# Patient Record
Sex: Female | Born: 1945 | Race: Black or African American | Hispanic: No | Marital: Married | Smoking: Never smoker
Health system: Southern US, Community
[De-identification: ages and names within clinical notes are randomized; demographics above are authoritative.]

## PROBLEM LIST (undated history)

## (undated) DIAGNOSIS — R519 Headache, unspecified: Secondary | ICD-10-CM

## (undated) DIAGNOSIS — D539 Nutritional anemia, unspecified: Secondary | ICD-10-CM

## (undated) DIAGNOSIS — D509 Iron deficiency anemia, unspecified: Secondary | ICD-10-CM

## (undated) DIAGNOSIS — E785 Hyperlipidemia, unspecified: Secondary | ICD-10-CM

## (undated) DIAGNOSIS — C9110 Chronic lymphocytic leukemia of B-cell type not having achieved remission: Secondary | ICD-10-CM

## (undated) DIAGNOSIS — N189 Chronic kidney disease, unspecified: Secondary | ICD-10-CM

## (undated) DIAGNOSIS — R011 Cardiac murmur, unspecified: Secondary | ICD-10-CM

## (undated) DIAGNOSIS — Z87442 Personal history of urinary calculi: Secondary | ICD-10-CM

## (undated) DIAGNOSIS — E781 Pure hyperglyceridemia: Secondary | ICD-10-CM

## (undated) DIAGNOSIS — R51 Headache: Secondary | ICD-10-CM

## (undated) DIAGNOSIS — D72828 Other elevated white blood cell count: Secondary | ICD-10-CM

## (undated) DIAGNOSIS — M1990 Unspecified osteoarthritis, unspecified site: Secondary | ICD-10-CM

## (undated) DIAGNOSIS — I10 Essential (primary) hypertension: Secondary | ICD-10-CM

## (undated) HISTORY — PX: ABDOMINAL HYSTERECTOMY: SHX81

## (undated) HISTORY — DX: Hyperlipidemia, unspecified: E78.5

## (undated) HISTORY — DX: Cardiac murmur, unspecified: R01.1

## (undated) HISTORY — PX: OOPHORECTOMY: SHX86

## (undated) HISTORY — PX: JOINT REPLACEMENT: SHX530

## (undated) HISTORY — DX: Nutritional anemia, unspecified: D53.9

## (undated) HISTORY — DX: Essential (primary) hypertension: I10

## (undated) HISTORY — DX: Chronic lymphocytic leukemia of B-cell type not having achieved remission: C91.10

## (undated) HISTORY — DX: Pure hyperglyceridemia: E78.1

## (undated) HISTORY — DX: Other elevated white blood cell count: D72.828

## (undated) HISTORY — DX: Unspecified osteoarthritis, unspecified site: M19.90

---

## 1994-11-20 HISTORY — PX: REFRACTIVE SURGERY: SHX103

## 1994-11-20 HISTORY — PX: EYE SURGERY: SHX253

## 1999-12-17 ENCOUNTER — Emergency Department (HOSPITAL_COMMUNITY): Admission: EM | Admit: 1999-12-17 | Discharge: 1999-12-17 | Payer: Self-pay | Admitting: Emergency Medicine

## 2000-01-31 ENCOUNTER — Encounter: Payer: Self-pay | Admitting: Internal Medicine

## 2000-01-31 ENCOUNTER — Encounter: Admission: RE | Admit: 2000-01-31 | Discharge: 2000-01-31 | Payer: Self-pay | Admitting: Internal Medicine

## 2000-03-28 ENCOUNTER — Encounter: Admission: RE | Admit: 2000-03-28 | Discharge: 2000-04-12 | Payer: Self-pay | Admitting: Internal Medicine

## 2000-05-12 ENCOUNTER — Encounter: Payer: Self-pay | Admitting: Emergency Medicine

## 2000-05-12 ENCOUNTER — Emergency Department (HOSPITAL_COMMUNITY): Admission: EM | Admit: 2000-05-12 | Discharge: 2000-05-12 | Payer: Self-pay | Admitting: Emergency Medicine

## 2000-05-25 ENCOUNTER — Encounter: Admission: RE | Admit: 2000-05-25 | Discharge: 2000-05-25 | Payer: Self-pay | Admitting: Urology

## 2000-05-25 ENCOUNTER — Encounter: Payer: Self-pay | Admitting: Urology

## 2001-03-28 ENCOUNTER — Encounter: Payer: Self-pay | Admitting: Internal Medicine

## 2001-03-28 ENCOUNTER — Encounter: Admission: RE | Admit: 2001-03-28 | Discharge: 2001-03-28 | Payer: Self-pay | Admitting: Internal Medicine

## 2001-04-04 ENCOUNTER — Encounter: Admission: RE | Admit: 2001-04-04 | Discharge: 2001-04-04 | Payer: Self-pay | Admitting: Internal Medicine

## 2001-04-04 ENCOUNTER — Encounter: Payer: Self-pay | Admitting: Internal Medicine

## 2001-10-08 ENCOUNTER — Encounter: Payer: Self-pay | Admitting: Internal Medicine

## 2001-10-08 ENCOUNTER — Ambulatory Visit (HOSPITAL_COMMUNITY): Admission: RE | Admit: 2001-10-08 | Discharge: 2001-10-08 | Payer: Self-pay | Admitting: Internal Medicine

## 2002-01-26 ENCOUNTER — Encounter: Payer: Self-pay | Admitting: Emergency Medicine

## 2002-01-26 ENCOUNTER — Emergency Department (HOSPITAL_COMMUNITY): Admission: EM | Admit: 2002-01-26 | Discharge: 2002-01-26 | Payer: Self-pay | Admitting: Emergency Medicine

## 2002-04-07 ENCOUNTER — Encounter: Payer: Self-pay | Admitting: Internal Medicine

## 2002-04-07 ENCOUNTER — Encounter: Admission: RE | Admit: 2002-04-07 | Discharge: 2002-04-07 | Payer: Self-pay | Admitting: Internal Medicine

## 2003-05-04 ENCOUNTER — Other Ambulatory Visit: Admission: RE | Admit: 2003-05-04 | Discharge: 2003-05-04 | Payer: Self-pay | Admitting: Internal Medicine

## 2004-03-19 ENCOUNTER — Emergency Department (HOSPITAL_COMMUNITY): Admission: EM | Admit: 2004-03-19 | Discharge: 2004-03-19 | Payer: Self-pay | Admitting: Emergency Medicine

## 2004-04-13 ENCOUNTER — Ambulatory Visit (HOSPITAL_COMMUNITY): Admission: RE | Admit: 2004-04-13 | Discharge: 2004-04-13 | Payer: Self-pay | Admitting: Orthopedic Surgery

## 2004-04-13 ENCOUNTER — Ambulatory Visit (HOSPITAL_BASED_OUTPATIENT_CLINIC_OR_DEPARTMENT_OTHER): Admission: RE | Admit: 2004-04-13 | Discharge: 2004-04-13 | Payer: Self-pay | Admitting: Orthopedic Surgery

## 2005-06-08 ENCOUNTER — Other Ambulatory Visit: Admission: RE | Admit: 2005-06-08 | Discharge: 2005-06-08 | Payer: Self-pay | Admitting: Internal Medicine

## 2005-11-20 HISTORY — PX: REPLACEMENT TOTAL KNEE: SUR1224

## 2006-08-06 ENCOUNTER — Inpatient Hospital Stay (HOSPITAL_COMMUNITY): Admission: RE | Admit: 2006-08-06 | Discharge: 2006-08-10 | Payer: Self-pay | Admitting: Orthopedic Surgery

## 2006-08-25 ENCOUNTER — Emergency Department (HOSPITAL_COMMUNITY): Admission: EM | Admit: 2006-08-25 | Discharge: 2006-08-25 | Payer: Self-pay | Admitting: Emergency Medicine

## 2006-09-11 ENCOUNTER — Encounter: Payer: Self-pay | Admitting: Orthopedic Surgery

## 2006-09-20 ENCOUNTER — Encounter: Payer: Self-pay | Admitting: Orthopedic Surgery

## 2006-10-20 ENCOUNTER — Encounter: Payer: Self-pay | Admitting: Orthopedic Surgery

## 2006-11-20 HISTORY — PX: COLONOSCOPY: SHX174

## 2007-05-30 ENCOUNTER — Ambulatory Visit: Payer: Self-pay

## 2008-06-02 ENCOUNTER — Ambulatory Visit: Payer: Self-pay

## 2008-06-09 ENCOUNTER — Ambulatory Visit: Payer: Self-pay

## 2008-06-11 ENCOUNTER — Other Ambulatory Visit: Admission: RE | Admit: 2008-06-11 | Discharge: 2008-06-11 | Payer: Self-pay | Admitting: Internal Medicine

## 2008-11-18 ENCOUNTER — Encounter: Payer: Self-pay | Admitting: Orthopedic Surgery

## 2008-11-20 ENCOUNTER — Encounter: Payer: Self-pay | Admitting: Orthopedic Surgery

## 2008-12-21 ENCOUNTER — Encounter: Payer: Self-pay | Admitting: Orthopedic Surgery

## 2009-05-07 ENCOUNTER — Ambulatory Visit: Payer: Self-pay | Admitting: General Practice

## 2009-06-02 ENCOUNTER — Emergency Department (HOSPITAL_COMMUNITY): Admission: EM | Admit: 2009-06-02 | Discharge: 2009-06-02 | Payer: Self-pay | Admitting: Emergency Medicine

## 2009-07-30 ENCOUNTER — Ambulatory Visit: Payer: Self-pay | Admitting: Oncology

## 2009-08-03 ENCOUNTER — Encounter: Payer: Self-pay | Admitting: Oncology

## 2009-08-03 ENCOUNTER — Other Ambulatory Visit: Admission: RE | Admit: 2009-08-03 | Discharge: 2009-08-03 | Payer: Self-pay | Admitting: Oncology

## 2009-08-03 LAB — CHCC SMEAR

## 2009-08-03 LAB — CBC WITH DIFFERENTIAL/PLATELET
EOS%: 0.6 % (ref 0.0–7.0)
HGB: 10.2 g/dL — ABNORMAL LOW (ref 11.6–15.9)
LYMPH%: 67.6 % — ABNORMAL HIGH (ref 14.0–49.7)
MCH: 31.1 pg (ref 25.1–34.0)
NEUT#: 3.7 10*3/uL (ref 1.5–6.5)
NEUT%: 27.2 % — ABNORMAL LOW (ref 38.4–76.8)
Platelets: 297 10*3/uL (ref 145–400)
WBC: 13.6 10*3/uL — ABNORMAL HIGH (ref 3.9–10.3)
lymph#: 9.2 10*3/uL — ABNORMAL HIGH (ref 0.9–3.3)

## 2009-08-05 LAB — SPEP & IFE WITH QIG
Albumin ELP: 57.6 % (ref 55.8–66.1)
Alpha-1-Globulin: 4.7 % (ref 2.9–4.9)
Beta Globulin: 7.8 % — ABNORMAL HIGH (ref 4.7–7.2)
IgA: 150 mg/dL (ref 68–378)

## 2009-08-05 LAB — VITAMIN B12: Vitamin B-12: 705 pg/mL (ref 211–911)

## 2009-08-05 LAB — IRON AND TIBC
Iron: 82 ug/dL (ref 42–145)
TIBC: 422 ug/dL (ref 250–470)

## 2009-08-05 LAB — COMPREHENSIVE METABOLIC PANEL
ALT: 16 U/L (ref 0–35)
Albumin: 4.3 g/dL (ref 3.5–5.2)
CO2: 20 mEq/L (ref 19–32)
Glucose, Bld: 137 mg/dL — ABNORMAL HIGH (ref 70–99)

## 2009-08-05 LAB — ERYTHROPOIETIN: Erythropoietin: 17.7 m[IU]/mL (ref 2.6–34.0)

## 2009-08-05 LAB — FOLATE: Folate: >20 ng/mL

## 2009-08-11 LAB — FLOW CYTOMETRY

## 2009-09-16 ENCOUNTER — Encounter: Admission: RE | Admit: 2009-09-16 | Discharge: 2009-09-16 | Payer: Self-pay | Admitting: Orthopedic Surgery

## 2009-11-29 ENCOUNTER — Ambulatory Visit: Payer: Self-pay | Admitting: Oncology

## 2009-12-29 ENCOUNTER — Ambulatory Visit (HOSPITAL_BASED_OUTPATIENT_CLINIC_OR_DEPARTMENT_OTHER): Admission: RE | Admit: 2009-12-29 | Discharge: 2009-12-29 | Payer: Self-pay | Admitting: Orthopedic Surgery

## 2010-01-10 ENCOUNTER — Ambulatory Visit: Payer: Self-pay | Admitting: Oncology

## 2010-01-12 LAB — CBC WITH DIFFERENTIAL/PLATELET
Basophils Absolute: 0 10*3/uL (ref 0.0–0.1)
EOS%: 0.1 % (ref 0.0–7.0)
Eosinophils Absolute: 0 10*3/uL (ref 0.0–0.5)
HGB: 10.3 g/dL — ABNORMAL LOW (ref 11.6–15.9)
MCH: 29.9 pg (ref 25.1–34.0)
MONO%: 2.1 % (ref 0.0–14.0)
NEUT%: 31 % — ABNORMAL LOW (ref 38.4–76.8)
Platelets: 302 10*3/uL (ref 145–400)
RBC: 3.44 10*6/uL — ABNORMAL LOW (ref 3.70–5.45)
WBC: 19.5 10*3/uL — ABNORMAL HIGH (ref 3.9–10.3)
lymph#: 13 10*3/uL — ABNORMAL HIGH (ref 0.9–3.3)

## 2010-01-12 LAB — IRON AND TIBC: %SAT: 14 % — ABNORMAL LOW (ref 20–55)

## 2010-01-12 LAB — COMPREHENSIVE METABOLIC PANEL
AST: 18 U/L (ref 0–37)
Albumin: 3.9 g/dL (ref 3.5–5.2)
Alkaline Phosphatase: 74 U/L (ref 39–117)
BUN: 27 mg/dL — ABNORMAL HIGH (ref 6–23)
Creatinine, Ser: 1.74 mg/dL — ABNORMAL HIGH (ref 0.40–1.20)
Sodium: 134 mEq/L — ABNORMAL LOW (ref 135–145)
Total Bilirubin: 0.6 mg/dL (ref 0.3–1.2)

## 2010-01-12 LAB — FERRITIN: Ferritin: 13 ng/mL (ref 10–291)

## 2010-01-26 ENCOUNTER — Other Ambulatory Visit: Admission: RE | Admit: 2010-01-26 | Discharge: 2010-01-26 | Payer: Self-pay | Admitting: Family Medicine

## 2010-07-12 ENCOUNTER — Ambulatory Visit: Payer: Self-pay

## 2010-12-11 ENCOUNTER — Encounter: Payer: Self-pay | Admitting: Orthopedic Surgery

## 2011-02-10 LAB — BASIC METABOLIC PANEL
CO2: 26 mEq/L (ref 19–32)
Chloride: 102 mEq/L (ref 96–112)
Creatinine, Ser: 1.43 mg/dL — ABNORMAL HIGH (ref 0.4–1.2)
GFR calc non Af Amer: 37 mL/min — ABNORMAL LOW (ref >60)
Glucose, Bld: 151 mg/dL — ABNORMAL HIGH (ref 70–99)

## 2011-02-10 LAB — GLUCOSE, CAPILLARY
Glucose-Capillary: 78 mg/dL (ref 70–99)
Glucose-Capillary: 92 mg/dL (ref 70–99)

## 2011-04-07 NOTE — Op Note (Signed)
NAME:  Tammy [name], Tammy [name] NO.:  [id number]   MEDICAL RECORD NO.:  [id number]                   PATIENT TYPE:  AMB   LOCATION:  DSC                                  FACILITY:  [facility]   PHYSICIAN:  Harvie Junior, M.D.                DATE OF BIRTH:  02-Feb-1946   DATE OF PROCEDURE:  04/13/2004  DATE OF DISCHARGE:                                 OPERATIVE REPORT   PREOPERATIVE DIAGNOSIS:  Medial meniscal tear.   POSTOPERATIVE DIAGNOSIS:  1. Medial meniscal tear.  2. Chondromalacia of the medial femoral condyle.  3. Medial shelf plica.  4. Chondromalacia of the patella.   PROCEDURE:  1. Debridement of posterior horn of medial meniscal tear with corresponding     debridement of medial femoral condyle.  2. Debridement of medial shelf plica.  3. Debridement of chondromalacia of the patella.   SURGEON:  Harvie Junior, M.D.   ASSISTANT:  [name], P.A.   ANESTHESIA:  General.   BRIEF HISTORY:  She is a 65 year old female with a long history of having  medial joint line pain.  She was ultimately evaluated by Dr. Donette Larry who  evaluated her and felt she had a meniscal tear.  He got an MRI which showed  she had a meniscal tear and she was sent over for evaluation.  We evaluated  her at that time and felt that she did have a displaced meniscus tear with  positive [place] Scrape and felt that operative intervention was the appropriate  course of action and she is brought to the operating room for this  procedure.   DESCRIPTION OF PROCEDURE:  The patient was taken to the operating room and  after adequate general anesthesia, the patient was placed on the operating  table, the left leg was prepped and draped in the usual sterile fashion.  Following this, routine arthroscopic examination of the knee revealed there  was an obvious posterior horn medial meniscal tear.  This was debrided with  a combination of straight biting forceps and upbiting forceps.  The  remaining meniscal rim was contoured down with a suction shaver.  The medial  medial femoral condyle was noted to have some grade 2 changes and this was  debrided.  The medial gutter was then evaluated and noted to have a fairly  significant synovial proliferative changes.  This was debrided with a  suction shaver.  She also had a large medial shelf plica which was debrided.  Attention was turned to the patellofemoral joint where there was some grade  2 changes of chondromalacia and this was debrided. The patellofemoral track  was noted to be midline.  The lateral  compartment was identified and noted to e within normal limits.  The ACL was  normal.  At this point, the knee was copiously irrigated and suctioned dry.  The arthroscopic portals were closed with a bandage.  A  sterile compressive  dressing was applied.  The patient was taken to the recovery room and was  noted to be in satisfactory condition.                                               Harvie Junior, M.D.    Ranae Plumber  D:  04/13/2004  T:  04/13/2004  Job:  [id number]

## 2011-04-07 NOTE — Op Note (Signed)
NAME:  Tammy [name], Tammy [name] NO.:  [id number]   MEDICAL RECORD NO.:  [id number]          PATIENT TYPE:  INP   LOCATION:  5016                         FACILITY:  [facility]   PHYSICIAN:  Harvie Junior, M.D.   DATE OF BIRTH:  25-Mar-1946   DATE OF PROCEDURE:  DATE OF DISCHARGE:                                 OPERATIVE REPORT   PREOPERATIVE DIAGNOSIS:  End stage degenerative joint disease, left knee.   POSTOPERATIVE DIAGNOSIS:  End stage degenerative joint disease, left knee.   PROCEDURE:  1. Left total knee replacement.  2. Computer assisted left total knee replacement.   SURGEON:  Jodi Geralds, MD   ASSISTANT:  Gus Puma, present throughout the case.   BRIEF HISTORY:  Tammy [name] is a 65 year old female with a long history of  having had significant left knee pain.  She basically had been treated with  antiinflammatory medication, injection therapy, pain medication, activity  modification.  All of these things failed and she continued to have left  knee pain.  We talked about treatment options including the possibility of  repeat arthroscopic intervention versus knee replacement.  Given that she  had bone-on-bone changes on her x-ray and had failed what we consider to be  adequate conservative care, we also felt that at this time it was  appropriate to take her to the operating room for a left total knee  replacement.  Given her young age and need for prolonged life of her joint  replacement, it was felt that computer assisted total knee replacement was  appropriate to allow for prolonged life of the implant.  She was brought to  the operating room for these procedures.   PROCEDURE:  The patient was brought to the operating room and after adequate  anesthesia was obtained with a general anesthetic the patient was positioned  on the operating room table.  The left leg was prepped and draped in the  usual sterile fashion.  Following this, a midline incision was made,  subcutaneous tissue down to the level of the extensor mechanism and a medial  parapatellar arthrotomy was undertaken.  Following this, attention was  turned towards the medial exposure where a medial exposure was undertaken.  The anterior and posterior cruciates were then excised.  The medial and  lateral meniscus were incised in total.  Once this was accomplished,  attention was turned towards the computer assistance module.  The two distal  pins were placed percutaneously and two pins were placed proximally within  the femur to allow access to the rays.  The rays were then placed at this  point and then registration process undertaken; this takes about 15 minutes  to do this.  Once the registration was completed then the tibia was cut  perpendicular to its long axis.  Attention was then turned towards the  placement of the tensiometers.  The tensiometers were used to get maximum  flexion and extension gaps and this was felt to be appropriate given the  balance.  At this point the distal femur was cut and at that point I like to  use the spacer blocks just to check; a little bit snug in extension and at  that point I felt to cut another 2 mm of tibia and this was undertaken.  Once that was done the spacer blocks went in quite well and this also took  the extensor situation out of the red on the intraoperative computer  assisted plan, because that then gave enough room for the implant and  extension.  At this point, attention was turned towards setting the anterior  and posterior cuts.  These were done with the size 2.5 block.  The box cut  was then made.  Following this, attention was turned to the tibia, which is  sized at 3 and the tibia rotation was set.  The rotation was set off of the  femur with the femoral rotation with the computer module.  At this point the  trial tibia was put in place.  It was drilled.  The trial femur was put in  place.  It was drilled and logs were drilled and  then the patella was sized  and cut to a 38 mm, all poly patella.  We cut off a 10 and put this back in.  At this point, once this was completed, the trial components were put in  place and put through a range of motion.  Computer assistance then at that  point showed [place] that we had good full extension and that we had good gap  balance, about 1 degree of varus and I felt that was fine, given the gaps  being balanced and a significant rotational alignment that she had coming  into the case, I was very happy with that final situation.  At that point  the wounds were copiously irrigated.  The trial components were removed and  the bone was washed thoroughly and dried.  Once that was the case the final  components were cemented into place; 2.5 femur, size 3 tibia, a 10 mm  bridging bearing, and a 38 mm all polyethylene patella.  All excess cement  was removed from the case during the wash out.  At this point, the knee was  put through a range of motion, our final computer assistance alignment was  undertaken and noted to have a perfect long alignment with 1 degree of varus  but certainly acceptable long alignment, which I thought was great given the  rotational issues that we were dealing with.  The perfect gap balance is  that of full extension and 90 degrees of flexion.  At this point a medium  Hemovac drain was placed.  The medial parapatellar arthrotomy was closed  with a #1 Vicryl running suture.  The skin was then closed with 0 and 2-0  Vicryl's and skin staples.  A sterile compressive dressing was applied, as  well as a knee immobilizer.  The patient was taken to recovery and she was  noted to be in satisfactory condition.  Estimated blood loss during  procedure was less than 50 cc.  The tourniquet was let down prior to  closure.      Harvie Junior, M.D.  Electronically Signed     JLG/MEDQ  D:  08/06/2006  T:  08/07/2006  Job:  [id number]

## 2011-04-07 NOTE — Discharge Summary (Signed)
[name], [name] NO.:  [id number]   MEDICAL RECORD NO.:  [id number]          PATIENT TYPE:  INP   LOCATION:  5016                         FACILITY:  [facility]   PHYSICIAN:  Harvie Junior, M.D.   DATE OF BIRTH:  November 16, 1946   DATE OF ADMISSION:  08/06/2006  DATE OF DISCHARGE:  08/10/2006                                 DISCHARGE SUMMARY   ADMITTING DIAGNOSES:  1. End-stage degenerative joint disease left knee.  2. Hypertension.  3. Type 2 diabetes mellitus.   DISCHARGE DIAGNOSES:  1. End-stage degenerative joint disease left knee.  2. Hypertension.  3. Type 2 diabetes mellitus.  4. Blood loss anemia.   PROCEDURES IN HOSPITAL:  Left total knee replacement, computer assisted,  Jodi Geralds, MD, August 06, 2006.   BRIEF HISTORY:  Tammy [name] is a pleasant 65 year old female who has a long  history of left knee pain.  X-rays showed bone on bone arthritis.  She has  night pain and pain with ambulation.  She got no relief with conservative  treatment including multiple courses of injections, modification of her  activity and use of antiinflammatories.  The patient went under clinical and  radiographic findings, she is admitted for a left total knee replacement.   PERTINENT LABORATORY STUDIES:  Patient's EKG, on admission, showed normal  sinus rhythm with left bundle branch block.  Hemoglobin was 11.0, hematocrit  32.6.  On postop day number 1, her hemoglobin was 8.8, number 2 was 8.8,  number 3 8.5.  Her Protime on admission was 13.0, INR of 1.0, PTT was 24.0.  On the day of discharge, on Coumadin therapy, her INR was 1.7.  CMET, on  admission, was within normal limits.  BMET, on postop day number 1, was also  normal.  Urinalysis, on admission, was positive for nitrates and many  bacteria.   HOSPITAL COURSE:  Patient was taken to the operating room on August 06, 2006 where she underwent left total knee arthroplasty, computer assisted by  Dr. Luiz [name], as well  as described in his operative note.  Preoperatively, she  is given 80 mg IV of gentamycin and 1 gram of Ancef.  She is also given 5  doses of Ancef postoperatively q.8 hours.  She was put on IV fluids, her  usual home medications and a sliding scale insulin.  Coumadin was started  for DVT prophylaxis on the night of surgery.  On postop day number 1, she is  without complaints.  She is taking fluids without difficulty.  A Foley  catheter was put in place at the time of surgery was intact.  She had spiked  a fever up to 100.1 and then was found to be afebrile.  Her left knee  dressing was clean and dry.  Her hemoglobin was 8.8.  Her INR was 1.0.  Her  blood sugars remained relatively stable with the sliding scale insulin  treatments.  On postop day number 2, she was without complaints.  She was  taking fluids without difficulty.  She had gotten out of bed to the chair.  Foley catheter  was still intact.  She spiked a fever up to 101.8, was then  found to be 99.4.  Her left knee dressing was changed and her Hemovac drain  was pulled and her CBGs were up in the 232 range.  Her hemoglobin was 8.8  and her INR was 2.0.  She was felt to have some blood loss anemia.  PCA and  morphine pumps were discontinued.  Her IV was converted to a saline lock.  She works at a [employer] facility and she was interested in being  transferred to that facility in the [place] area.  She was unable to get  insurance coverage for this.  On postop day number 3, her hemoglobin was 8.5  and her INR was 2.1.  On postop day number 4, she was without complaints.  She was doing well with physical therapy.  She was taking fluids and voiding  without difficulty.  She stated she was ready to go home.  Her vital signs  were stable and she was afebrile.  Her CBGs were running around 194.  Her  INR was 1.7.  She is discharged home in improved condition.  She will be on  a diabetic diet.  She will take her usual home  medications.  She was given  an prescription of Percocet 5 mg number 50 p.r.n. for pain, Coumadin x1  month postoperative for DVT prophylaxis.  She will ambulate weight bearing  as tolerated on the left with a walker.  __________physical therapy 3 times  a week, home CPM and Coumadin management x1 month.  She will follow up with  Dr. Luiz [name] in 10 days.  She will follow up with her medical doctor about her  diabetes in 2 weeks.      Tammy [name], P.A.      Harvie Junior, M.D.  Electronically Signed    Tammy [name]  D:  09/26/2006  T:  09/27/2006  Job:  [id number]   cc:   Tammy Housekeeper, MD

## 2011-08-08 ENCOUNTER — Ambulatory Visit: Payer: Self-pay | Admitting: Internal Medicine

## 2011-08-10 ENCOUNTER — Ambulatory Visit: Payer: Self-pay | Admitting: Internal Medicine

## 2011-11-28 ENCOUNTER — Telehealth: Payer: Self-pay | Admitting: *Deleted

## 2011-11-28 NOTE — Telephone Encounter (Signed)
patient called in and requested to 01-12-2012 rescheduled from 08-25-2011 gave patinet confirmed over the phone the new date and time

## 2011-12-18 ENCOUNTER — Encounter: Payer: Self-pay | Admitting: *Deleted

## 2012-01-11 ENCOUNTER — Telehealth: Payer: Self-pay | Admitting: Oncology

## 2012-01-11 NOTE — Telephone Encounter (Signed)
Pt called to r/s 2/22 appt tp 3/22 due to just started new job.

## 2012-01-12 ENCOUNTER — Other Ambulatory Visit: Payer: Self-pay | Admitting: Lab

## 2012-01-12 ENCOUNTER — Ambulatory Visit: Payer: Self-pay | Admitting: Oncology

## 2012-02-09 ENCOUNTER — Other Ambulatory Visit (HOSPITAL_BASED_OUTPATIENT_CLINIC_OR_DEPARTMENT_OTHER): Payer: Medicare Other | Admitting: Lab

## 2012-02-09 ENCOUNTER — Telehealth: Payer: Self-pay | Admitting: Oncology

## 2012-02-09 ENCOUNTER — Ambulatory Visit (HOSPITAL_BASED_OUTPATIENT_CLINIC_OR_DEPARTMENT_OTHER): Payer: Medicare Other | Admitting: Oncology

## 2012-02-09 VITALS — BP 123/62 | HR 73 | Temp 96.8°F | Ht 68.0 in | Wt 250.8 lb

## 2012-02-09 DIAGNOSIS — D649 Anemia, unspecified: Secondary | ICD-10-CM

## 2012-02-09 DIAGNOSIS — N289 Disorder of kidney and ureter, unspecified: Secondary | ICD-10-CM

## 2012-02-09 DIAGNOSIS — D72820 Lymphocytosis (symptomatic): Secondary | ICD-10-CM

## 2012-02-09 DIAGNOSIS — C9110 Chronic lymphocytic leukemia of B-cell type not having achieved remission: Secondary | ICD-10-CM

## 2012-02-09 LAB — CBC WITH DIFFERENTIAL/PLATELET
Basophils Absolute: 0.2 10*3/uL — ABNORMAL HIGH (ref 0.0–0.1)
EOS%: 0.5 % (ref 0.0–7.0)
HGB: 10.8 g/dL — ABNORMAL LOW (ref 11.6–15.9)
MCH: 31.4 pg (ref 25.1–34.0)
MCV: 95.5 fL (ref 79.5–101.0)
MONO%: 1.7 % (ref 0.0–14.0)
NEUT%: 12 % — ABNORMAL LOW (ref 38.4–76.8)
RDW: 15.2 % — ABNORMAL HIGH (ref 11.2–14.5)

## 2012-02-09 LAB — COMPREHENSIVE METABOLIC PANEL
ALT: 15 U/L (ref 0–35)
Albumin: 4.2 g/dL (ref 3.5–5.2)
CO2: 22 mEq/L (ref 19–32)
Calcium: 9.3 mg/dL (ref 8.4–10.5)
Chloride: 106 mEq/L (ref 96–112)
Glucose, Bld: 118 mg/dL — ABNORMAL HIGH (ref 70–99)
Sodium: 139 mEq/L (ref 135–145)
Total Protein: 6.4 g/dL (ref 6.0–8.3)

## 2012-02-09 LAB — LACTATE DEHYDROGENASE: LDH: 173 U/L (ref 94–250)

## 2012-02-09 NOTE — Progress Notes (Signed)
Hematology and Oncology Follow Up Visit  Tammy [name] [id number] Dec 22, 1945 66 y.o. 02/09/2012 9:13 AM  CC: Tammy [name], M.D.  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 Diagnosed in 2010.     Current therapy: Observation and Surveillance.   Interim History:  Tammy [name] presents today for a follow-up visit.  A very pleasant 66 year old female with mild anemia and lymphocytosis likely diagnosis of CLL.  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  However, other than anemia that is probably multifactorial, there is no evidence of any infiltrative bone marrow process.  She has not reported any fevers or chills.  She has not reported any lymphadenopathy.  She has not reported any other complaints at the time being. No changes in her performance status at this time.   Medications: I have reviewed the patient's current medications. Current outpatient prescriptions:aspirin 81 MG tablet, Take 81 mg by mouth daily., Disp: , Rfl: ;  benazepril-hydrochlorthiazide (LOTENSIN HCT) 20-12.5 MG per tablet, Take 1 tablet by mouth daily., Disp: , Rfl: ;  cholecalciferol (VITAMIN D) 400 UNITS TABS, Take 400 Units by mouth daily., Disp: , Rfl: ;  ferrous sulfate 325 (65 FE) MG tablet, Take 325 mg by mouth daily with breakfast., Disp: , Rfl:  glimepiride (AMARYL) 4 MG tablet, Take 4 mg by mouth daily before breakfast., Disp: , Rfl: ;  ibuprofen (ADVIL,MOTRIN) 200 MG tablet, Take 400 mg by mouth every 6 (six) hours as needed., Disp: , Rfl: ;  metFORMIN (GLUCOPHAGE) 1000 MG tablet, Take 1,000 mg by mouth 2 (two) times daily with a meal., Disp: , Rfl: ;  Multiple Vitamins-Minerals (MULTIVITAMIN PO), Take by mouth., Disp: , Rfl:  omega-3 acid ethyl esters (LOVAZA) 1 G capsule, Take 1 g by mouth 2 (two) times daily., Disp: , Rfl: ;  pioglitazone (ACTOS) 45 MG tablet, Take 45 mg by mouth daily., Disp: , Rfl: ;  spironolactone  (ALDACTONE) 25 MG tablet, Take 25 mg by mouth daily., Disp: , Rfl: ;  verapamil (CALAN-SR) 180 MG CR tablet, Take 180 mg by mouth at bedtime., Disp: , Rfl:  HYDROcodone-acetaminophen (NORCO) 5-325 MG per tablet, Take 1 tablet by mouth every 6 (six) hours as needed., Disp: , Rfl:   Allergies:  Allergies  Allergen Reactions  . Sulfa Antibiotics     Past Medical History, Surgical history, Social history, and Family History were reviewed and updated.  Review of Systems: Constitutional:  Negative for fever, chills, night sweats, anorexia, weight loss, pain. Cardiovascular: no chest pain or dyspnea on exertion Respiratory: no cough, shortness of breath, or wheezing Neurological: negative Dermatological: negative ENT: negative Skin: Negative. Gastrointestinal: negative Genito-Urinary: negative Hematological and Lymphatic: negative Breast: negative Musculoskeletal: negative Remaining ROS negative. Physical Exam: Blood pressure 123/62, pulse 73, temperature 96.8 F (36 C), temperature source Oral, height 5\' 8"  (1.727 m), weight 250 lb 12.8 oz (113.762 kg). ECOG: 1 General appearance: alert Head: Normocephalic, without obvious abnormality, atraumatic Neck: no adenopathy, no carotid bruit, no JVD, supple, symmetrical, trachea midline and thyroid not enlarged, symmetric, no tenderness/mass/nodules Lymph nodes: Cervical, supraclavicular, and axillary nodes normal. Heart:regular rate and rhythm, S1, S2 normal, no murmur, click, rub or gallop Lung:chest clear, no wheezing, rales, normal symmetric air entry Abdomin: soft, non-tender, without masses or organomegaly EXT:no erythema, induration, or nodules   Lab Results: Lab Results  Component Value Date   WBC 32.5* 02/09/2012   HGB 10.8* 02/09/2012   HCT 32.7* 02/09/2012  MCV 95.5 02/09/2012   PLT 214 02/09/2012     Chemistry      Component Value Date/Time   NA 134* 01/12/2010 1529   K 4.5 01/12/2010 1529   CL 100 01/12/2010 1529   CO2  27 01/12/2010 1529   BUN 27* 01/12/2010 1529   CREATININE 1.74* 01/12/2010 1529      Component Value Date/Time   CALCIUM 9.0 01/12/2010 1529   ALKPHOS 74 01/12/2010 1529   AST 18 01/12/2010 1529   ALT 16 01/12/2010 1529   BILITOT 0.6 01/12/2010 1529      Impression and Plan: A 66 year old female with the following issues: 1. Lymphocytosis likely due to CLL.  Her lymphocyte percentage did do up but her Hemoglobin has remained stable at this time.  The plan is to continue to monitor her white cell count.  Again, if she develops symptomatic lymphadenopathy, abdominal distention or early satiety then we will do a CT scan and consider a bone marrow biopsy.   2. Anemia.  Again multifactorial, again anemia of renal disease.  Again, she could be a candidate for growth factor support if needed.   [facility], MD 3/22/20139:13 AM

## 2012-02-09 NOTE — Telephone Encounter (Signed)
gv pt appt for march2014 °

## 2012-02-12 NOTE — Progress Notes (Signed)
Called patient concerning potassium level @ 5.6; encouraged patient to increase fluids to 1.5-2 Liters daily, per Clenton Pare, NP;verbalizes understanding; patient states that she has appointment with PCP 03/04/12.

## 2012-02-13 ENCOUNTER — Ambulatory Visit: Payer: Self-pay | Admitting: Internal Medicine

## 2012-03-05 ENCOUNTER — Telehealth: Payer: Self-pay | Admitting: *Deleted

## 2012-03-05 NOTE — Telephone Encounter (Signed)
Rec'd referral for patient to see Dr Clelia Croft, already well established, will fax last office visit to Dr Donette Larry. Patient on annual visits with Dr Clelia Croft now.

## 2012-03-06 ENCOUNTER — Encounter: Payer: Self-pay | Admitting: *Deleted

## 2012-09-02 ENCOUNTER — Ambulatory Visit: Payer: Self-pay | Admitting: Internal Medicine

## 2012-12-25 ENCOUNTER — Inpatient Hospital Stay: Payer: Self-pay | Admitting: Internal Medicine

## 2012-12-25 LAB — COMPREHENSIVE METABOLIC PANEL
Albumin: 3.6 g/dL (ref 3.4–5.0)
Anion Gap: 9 (ref 7–16)
BUN: 37 mg/dL — ABNORMAL HIGH (ref 7–18)
Chloride: 105 mmol/L (ref 98–107)
Co2: 23 mmol/L (ref 21–32)
Glucose: 48 mg/dL — ABNORMAL LOW (ref 65–99)
Osmolality: 280 (ref 275–301)
Potassium: 4.6 mmol/L (ref 3.5–5.1)
SGPT (ALT): 24 U/L (ref 12–78)
Sodium: 137 mmol/L (ref 136–145)
Total Protein: 7.6 g/dL (ref 6.4–8.2)

## 2012-12-25 LAB — CK TOTAL AND CKMB (NOT AT ARMC): CK-MB: 0.8 ng/mL (ref 0.5–3.6)

## 2012-12-25 LAB — CBC
HCT: 32.3 % — ABNORMAL LOW (ref 35.0–47.0)
HGB: 10.4 g/dL — ABNORMAL LOW (ref 12.0–16.0)
MCH: 30.6 pg (ref 26.0–34.0)
MCHC: 32.1 g/dL (ref 32.0–36.0)
Platelet: 197 10*3/uL (ref 150–440)
RDW: 15.1 % — ABNORMAL HIGH (ref 11.5–14.5)
WBC: 39.3 10*3/uL — ABNORMAL HIGH (ref 3.6–11.0)

## 2012-12-25 LAB — URINALYSIS, COMPLETE
Bilirubin,UR: NEGATIVE
Ketone: NEGATIVE
Leukocyte Esterase: NEGATIVE
Ph: 5 (ref 4.5–8.0)
Protein: NEGATIVE
RBC,UR: 1 /HPF (ref 0–5)
WBC UR: 1 /HPF (ref 0–5)

## 2012-12-25 LAB — DIFFERENTIAL: Segmented Neutrophils: 20 %

## 2012-12-25 LAB — TROPONIN I: Troponin-I: <0.02 ng/mL

## 2012-12-26 LAB — CBC WITH DIFFERENTIAL/PLATELET
Basophil %: 0.1 %
Eosinophil %: 0.3 %
HGB: 9 g/dL — ABNORMAL LOW (ref 12.0–16.0)
Lymphocyte #: 21.5 10*3/uL — ABNORMAL HIGH (ref 1.0–3.6)
MCH: 30.6 pg (ref 26.0–34.0)
MCHC: 32.1 g/dL (ref 32.0–36.0)
Monocyte #: 0.7 x10 3/mm (ref 0.2–0.9)
Monocyte %: 2.5 %
Neutrophil #: 4.6 10*3/uL (ref 1.4–6.5)
Neutrophil %: 17.1 %
Platelet: 181 10*3/uL (ref 150–440)
RBC: 2.94 10*6/uL — ABNORMAL LOW (ref 3.80–5.20)
RDW: 15 % — ABNORMAL HIGH (ref 11.5–14.5)

## 2012-12-26 LAB — BASIC METABOLIC PANEL
BUN: 32 mg/dL — ABNORMAL HIGH (ref 7–18)
Calcium, Total: 8.5 mg/dL (ref 8.5–10.1)
Chloride: 105 mmol/L (ref 98–107)
Co2: 24 mmol/L (ref 21–32)
Creatinine: 1.74 mg/dL — ABNORMAL HIGH (ref 0.60–1.30)
EGFR (African American): 35 — ABNORMAL LOW
EGFR (Non-African Amer.): 30 — ABNORMAL LOW
Osmolality: 283 (ref 275–301)
Potassium: 5.4 mmol/L — ABNORMAL HIGH (ref 3.5–5.1)
Sodium: 135 mmol/L — ABNORMAL LOW (ref 136–145)

## 2012-12-26 LAB — OCCULT BLOOD X 1 CARD TO LAB, STOOL: Occult Blood, Feces: NEGATIVE

## 2012-12-27 LAB — CBC WITH DIFFERENTIAL/PLATELET
Eosinophil: 1 %
HGB: 9.7 g/dL — ABNORMAL LOW (ref 12.0–16.0)
MCH: 30.9 pg (ref 26.0–34.0)
MCHC: 32.7 g/dL (ref 32.0–36.0)
RBC: 3.15 10*6/uL — ABNORMAL LOW (ref 3.80–5.20)
RDW: 14.6 % — ABNORMAL HIGH (ref 11.5–14.5)
Segmented Neutrophils: 12 %
Variant Lymphocyte - H1-Rlymph: 34 %

## 2012-12-27 LAB — BASIC METABOLIC PANEL
Calcium, Total: 8.6 mg/dL (ref 8.5–10.1)
Chloride: 109 mmol/L — ABNORMAL HIGH (ref 98–107)
Co2: 24 mmol/L (ref 21–32)
Creatinine: 1.32 mg/dL — ABNORMAL HIGH (ref 0.60–1.30)
EGFR (Non-African Amer.): 42 — ABNORMAL LOW

## 2012-12-27 LAB — IRON AND TIBC
Iron Saturation: 16 %
Iron: 51 ug/dL (ref 50–170)
Unbound Iron-Bind.Cap.: 274 ug/dL

## 2012-12-31 ENCOUNTER — Telehealth: Payer: Self-pay | Admitting: Oncology

## 2012-12-31 NOTE — Telephone Encounter (Signed)
returned pt call and advised on March appt.Marland KitchenMarland Kitchen

## 2013-01-10 ENCOUNTER — Ambulatory Visit: Payer: Self-pay | Admitting: Internal Medicine

## 2013-01-10 LAB — CBC CANCER CENTER
Basophil %: 0.3 %
Eosinophil %: 0.3 %
HCT: 31.7 % — ABNORMAL LOW (ref 35.0–47.0)
HGB: 10.2 g/dL — ABNORMAL LOW (ref 12.0–16.0)
Lymphocyte #: 25.3 x10 3/mm — ABNORMAL HIGH (ref 1.0–3.6)
Lymphocyte %: 87.3 %
MCHC: 32.1 g/dL (ref 32.0–36.0)
MCV: 96 fL (ref 80–100)
Monocyte #: 0.6 x10 3/mm (ref 0.2–0.9)
Neutrophil %: 10 %
Platelet: 311 x10 3/mm (ref 150–440)
RBC: 3.31 10*6/uL — ABNORMAL LOW (ref 3.80–5.20)
RDW: 15.4 % — ABNORMAL HIGH (ref 11.5–14.5)
WBC: 28.9 x10 3/mm — ABNORMAL HIGH (ref 3.6–11.0)

## 2013-01-18 ENCOUNTER — Ambulatory Visit: Payer: Self-pay | Admitting: Internal Medicine

## 2013-01-29 ENCOUNTER — Encounter: Payer: Self-pay | Admitting: General Practice

## 2013-02-06 ENCOUNTER — Telehealth: Payer: Self-pay | Admitting: Oncology

## 2013-02-06 NOTE — Telephone Encounter (Signed)
pt called to cancel appts...records transferred to [facility]

## 2013-02-07 ENCOUNTER — Ambulatory Visit: Payer: Medicare Other | Admitting: Oncology

## 2013-02-07 ENCOUNTER — Other Ambulatory Visit: Payer: Medicare Other | Admitting: Lab

## 2013-02-18 ENCOUNTER — Encounter: Payer: Self-pay | Admitting: General Practice

## 2013-03-20 ENCOUNTER — Encounter: Payer: Self-pay | Admitting: General Practice

## 2013-04-20 ENCOUNTER — Encounter: Payer: Self-pay | Admitting: General Practice

## 2013-05-09 ENCOUNTER — Ambulatory Visit: Payer: Self-pay | Admitting: Internal Medicine

## 2013-05-16 LAB — CBC CANCER CENTER
Basophil #: 0 x10 3/mm (ref 0.0–0.1)
Basophil %: 0 %
Eosinophil #: 0.1 x10 3/mm (ref 0.0–0.7)
Eosinophil %: 0.4 %
HCT: 34.1 % — ABNORMAL LOW (ref 35.0–47.0)
HGB: 11.4 g/dL — ABNORMAL LOW (ref 12.0–16.0)
Lymphocyte #: 19.7 x10 3/mm — ABNORMAL HIGH (ref 1.0–3.6)
Lymphocyte %: 82.2 %
MCHC: 33.3 g/dL (ref 32.0–36.0)
MCV: 95 fL (ref 80–100)
Neutrophil %: 14.7 %
Platelet: 218 x10 3/mm (ref 150–440)
RBC: 3.59 10*6/uL — ABNORMAL LOW (ref 3.80–5.20)
RDW: 14.9 % — ABNORMAL HIGH (ref 11.5–14.5)

## 2013-05-20 ENCOUNTER — Ambulatory Visit: Payer: Self-pay | Admitting: Internal Medicine

## 2013-09-04 ENCOUNTER — Ambulatory Visit: Payer: Self-pay | Admitting: Internal Medicine

## 2013-10-01 ENCOUNTER — Ambulatory Visit: Payer: Self-pay | Admitting: Internal Medicine

## 2013-10-01 LAB — CBC CANCER CENTER
Basophil #: 0 x10 3/mm (ref 0.0–0.1)
Basophil %: 0.2 %
Eosinophil %: 0.5 %
HCT: 34.1 % — ABNORMAL LOW (ref 35.0–47.0)
HGB: 10.8 g/dL — ABNORMAL LOW (ref 12.0–16.0)
Lymphocyte %: 87.1 %
MCH: 30.7 pg (ref 26.0–34.0)
MCHC: 31.8 g/dL — ABNORMAL LOW (ref 32.0–36.0)
Monocyte #: 0.4 x10 3/mm (ref 0.2–0.9)
Neutrophil %: 10.7 %
Platelet: 154 x10 3/mm (ref 150–440)
RBC: 3.53 10*6/uL — ABNORMAL LOW (ref 3.80–5.20)
RDW: 14.4 % (ref 11.5–14.5)

## 2013-10-01 LAB — CREATININE, SERUM
Creatinine: 1.67 mg/dL — ABNORMAL HIGH (ref 0.60–1.30)
EGFR (African American): 37 — ABNORMAL LOW
EGFR (Non-African Amer.): 32 — ABNORMAL LOW

## 2013-10-01 LAB — URIC ACID: Uric Acid: 6.6 mg/dL — ABNORMAL HIGH (ref 2.6–6.0)

## 2013-10-03 LAB — KAPPA/LAMBDA FREE LIGHT CHAINS (ARMC)

## 2013-10-03 LAB — PROT IMMUNOELECTROPHORES(ARMC)

## 2013-10-20 ENCOUNTER — Ambulatory Visit: Payer: Self-pay | Admitting: Internal Medicine

## 2013-10-24 LAB — CREATININE, SERUM: Creatinine: 1.45 mg/dL — ABNORMAL HIGH (ref 0.60–1.30)

## 2013-10-24 LAB — CANCER CENTER HEMOGLOBIN: HGB: 10.4 g/dL — ABNORMAL LOW (ref 12.0–16.0)

## 2013-10-27 LAB — KAPPA/LAMBDA FREE LIGHT CHAINS (ARMC)

## 2013-11-20 ENCOUNTER — Ambulatory Visit: Payer: Self-pay | Admitting: Internal Medicine

## 2013-11-20 HISTORY — PX: REPLACEMENT TOTAL KNEE: SUR1224

## 2014-01-28 ENCOUNTER — Ambulatory Visit: Payer: Self-pay | Admitting: Internal Medicine

## 2014-01-29 LAB — CBC CANCER CENTER
Basophil #: 0 x10 3/mm (ref 0.0–0.1)
Basophil %: 0.3 %
EOS PCT: 0.4 %
Eosinophil #: 0.1 x10 3/mm (ref 0.0–0.7)
HCT: 35.2 % (ref 35.0–47.0)
HGB: 11 g/dL — AB (ref 12.0–16.0)
Lymphocyte #: 14 x10 3/mm — ABNORMAL HIGH (ref 1.0–3.6)
Lymphocyte %: 77.1 %
MCH: 29.9 pg (ref 26.0–34.0)
MCHC: 31.2 g/dL — AB (ref 32.0–36.0)
MCV: 96 fL (ref 80–100)
Monocyte #: 0.4 x10 3/mm (ref 0.2–0.9)
Monocyte %: 2.3 %
Neutrophil #: 3.6 x10 3/mm (ref 1.4–6.5)
Neutrophil %: 19.9 %
Platelet: 252 x10 3/mm (ref 150–440)
RBC: 3.68 10*6/uL — ABNORMAL LOW (ref 3.80–5.20)
RDW: 14.3 % (ref 11.5–14.5)
WBC: 18.2 x10 3/mm — ABNORMAL HIGH (ref 3.6–11.0)

## 2014-01-29 LAB — CREATININE, SERUM
CREATININE: 1.31 mg/dL — AB (ref 0.60–1.30)
EGFR (African American): 49 — ABNORMAL LOW
GFR CALC NON AF AMER: 42 — AB

## 2014-01-29 LAB — URIC ACID: Uric Acid: 6.3 mg/dL — ABNORMAL HIGH (ref 2.6–6.0)

## 2014-01-29 LAB — LACTATE DEHYDROGENASE: LDH: 211 U/L (ref 81–246)

## 2014-01-30 LAB — KAPPA/LAMBDA FREE LIGHT CHAINS (ARMC)

## 2014-02-17 ENCOUNTER — Ambulatory Visit: Payer: Self-pay | Admitting: General Practice

## 2014-02-17 DIAGNOSIS — I10 Essential (primary) hypertension: Secondary | ICD-10-CM

## 2014-02-17 LAB — URINALYSIS, COMPLETE
Bilirubin,UR: NEGATIVE
Glucose,UR: NEGATIVE mg/dL (ref 0–75)
KETONE: NEGATIVE
NITRITE: POSITIVE
PH: 5 (ref 4.5–8.0)
Protein: NEGATIVE
RBC,UR: 2 /HPF (ref 0–5)
Specific Gravity: 1.012 (ref 1.003–1.030)
Squamous Epithelial: <1
WBC UR: 1 /HPF (ref 0–5)

## 2014-02-17 LAB — BASIC METABOLIC PANEL
Anion Gap: 4 — ABNORMAL LOW (ref 7–16)
BUN: 30 mg/dL — ABNORMAL HIGH (ref 7–18)
CALCIUM: 8.5 mg/dL (ref 8.5–10.1)
Chloride: 105 mmol/L (ref 98–107)
Co2: 29 mmol/L (ref 21–32)
Creatinine: 1.39 mg/dL — ABNORMAL HIGH (ref 0.60–1.30)
EGFR (African American): 45 — ABNORMAL LOW
GFR CALC NON AF AMER: 39 — AB
GLUCOSE: 192 mg/dL — AB (ref 65–99)
Osmolality: 287 (ref 275–301)
POTASSIUM: 3.9 mmol/L (ref 3.5–5.1)
SODIUM: 138 mmol/L (ref 136–145)

## 2014-02-17 LAB — CBC
HCT: 33.6 % — ABNORMAL LOW (ref 35.0–47.0)
HGB: 10.7 g/dL — ABNORMAL LOW (ref 12.0–16.0)
MCH: 30.2 pg (ref 26.0–34.0)
MCHC: 31.9 g/dL — ABNORMAL LOW (ref 32.0–36.0)
MCV: 95 fL (ref 80–100)
PLATELETS: 188 10*3/uL (ref 150–440)
RBC: 3.54 10*6/uL — AB (ref 3.80–5.20)
RDW: 14.4 % (ref 11.5–14.5)
WBC: 19 10*3/uL — ABNORMAL HIGH (ref 3.6–11.0)

## 2014-02-17 LAB — PROTIME-INR
INR: 1
PROTHROMBIN TIME: 13.2 s (ref 11.5–14.7)

## 2014-02-17 LAB — MRSA PCR SCREENING

## 2014-02-17 LAB — SEDIMENTATION RATE: ERYTHROCYTE SED RATE: 31 mm/h — AB (ref 0–30)

## 2014-02-17 LAB — APTT: Activated PTT: 27.2 secs (ref 23.6–35.9)

## 2014-02-18 ENCOUNTER — Ambulatory Visit: Payer: Self-pay | Admitting: Internal Medicine

## 2014-02-19 LAB — URINE CULTURE

## 2014-03-11 ENCOUNTER — Inpatient Hospital Stay: Payer: Self-pay | Admitting: General Practice

## 2014-03-12 LAB — BASIC METABOLIC PANEL
Anion Gap: 6 — ABNORMAL LOW (ref 7–16)
BUN: 21 mg/dL — ABNORMAL HIGH (ref 7–18)
Calcium, Total: 7.8 mg/dL — ABNORMAL LOW (ref 8.5–10.1)
Chloride: 108 mmol/L — ABNORMAL HIGH (ref 98–107)
Co2: 26 mmol/L (ref 21–32)
Creatinine: 1.24 mg/dL (ref 0.60–1.30)
EGFR (African American): 52 — ABNORMAL LOW
EGFR (Non-African Amer.): 45 — ABNORMAL LOW
Glucose: 99 mg/dL (ref 65–99)
Osmolality: 282 (ref 275–301)
POTASSIUM: 3.7 mmol/L (ref 3.5–5.1)
Sodium: 140 mmol/L (ref 136–145)

## 2014-03-12 LAB — HEMOGLOBIN: HGB: 9.3 g/dL — AB (ref 12.0–16.0)

## 2014-03-12 LAB — PLATELET COUNT: Platelet: 173 10*3/uL (ref 150–440)

## 2014-03-13 LAB — BASIC METABOLIC PANEL
Anion Gap: 9 (ref 7–16)
BUN: 28 mg/dL — ABNORMAL HIGH (ref 7–18)
Calcium, Total: 7.8 mg/dL — ABNORMAL LOW (ref 8.5–10.1)
Chloride: 105 mmol/L (ref 98–107)
Co2: 25 mmol/L (ref 21–32)
Creatinine: 1.43 mg/dL — ABNORMAL HIGH (ref 0.60–1.30)
EGFR (Non-African Amer.): 38 — ABNORMAL LOW
GFR CALC AF AMER: 44 — AB
GLUCOSE: 97 mg/dL (ref 65–99)
Osmolality: 283 (ref 275–301)
POTASSIUM: 3.7 mmol/L (ref 3.5–5.1)
Sodium: 139 mmol/L (ref 136–145)

## 2014-03-13 LAB — PLATELET COUNT: PLATELETS: 153 10*3/uL (ref 150–440)

## 2014-03-13 LAB — HEMOGLOBIN: HGB: 8.4 g/dL — ABNORMAL LOW (ref 12.0–16.0)

## 2014-03-15 ENCOUNTER — Encounter: Payer: Self-pay | Admitting: Internal Medicine

## 2014-03-20 ENCOUNTER — Encounter: Payer: Self-pay | Admitting: Internal Medicine

## 2014-03-24 LAB — HEMOGLOBIN A1C: Hemoglobin A1C: 7.2 % — ABNORMAL HIGH (ref 4.2–6.3)

## 2014-03-26 DIAGNOSIS — Z96659 Presence of unspecified artificial knee joint: Secondary | ICD-10-CM

## 2014-05-28 ENCOUNTER — Ambulatory Visit: Payer: Self-pay | Admitting: Internal Medicine

## 2014-05-28 LAB — CBC CANCER CENTER
Basophil #: 0 x10 3/mm (ref 0.0–0.1)
Basophil %: 0.1 %
EOS PCT: 1.2 %
Eosinophil #: 0.2 x10 3/mm (ref 0.0–0.7)
HCT: 33.4 % — ABNORMAL LOW (ref 35.0–47.0)
HGB: 10.6 g/dL — ABNORMAL LOW (ref 12.0–16.0)
LYMPHS ABS: 13.6 x10 3/mm — AB (ref 1.0–3.6)
LYMPHS PCT: 79.3 %
MCH: 30.1 pg (ref 26.0–34.0)
MCHC: 31.9 g/dL — ABNORMAL LOW (ref 32.0–36.0)
MCV: 94 fL (ref 80–100)
MONOS PCT: 2.5 %
Monocyte #: 0.4 x10 3/mm (ref 0.2–0.9)
Neutrophil #: 2.9 x10 3/mm (ref 1.4–6.5)
Neutrophil %: 16.9 %
Platelet: 225 x10 3/mm (ref 150–440)
RBC: 3.54 10*6/uL — ABNORMAL LOW (ref 3.80–5.20)
RDW: 15.2 % — ABNORMAL HIGH (ref 11.5–14.5)
WBC: 17.2 x10 3/mm — AB (ref 3.6–11.0)

## 2014-05-28 LAB — CREATININE, SERUM
CREATININE: 1.47 mg/dL — AB (ref 0.60–1.30)
EGFR (Non-African Amer.): 37 — ABNORMAL LOW
GFR CALC AF AMER: 42 — AB

## 2014-05-28 LAB — LACTATE DEHYDROGENASE: LDH: 206 U/L (ref 81–246)

## 2014-05-28 LAB — URIC ACID: Uric Acid: 6.6 mg/dL — ABNORMAL HIGH (ref 2.6–6.0)

## 2014-05-29 LAB — KAPPA/LAMBDA FREE LIGHT CHAINS (ARMC)

## 2014-06-20 ENCOUNTER — Ambulatory Visit: Payer: Self-pay | Admitting: Internal Medicine

## 2014-09-02 ENCOUNTER — Ambulatory Visit: Payer: Self-pay | Admitting: Internal Medicine

## 2014-09-02 LAB — URIC ACID: Uric Acid: 5.9 mg/dL (ref 2.6–6.0)

## 2014-09-02 LAB — CBC CANCER CENTER
Basophil #: 0.1 x10 3/mm (ref 0.0–0.1)
Basophil %: 0.4 %
EOS ABS: 0.1 x10 3/mm (ref 0.0–0.7)
Eosinophil %: 0.6 %
HCT: 33.4 % — AB (ref 35.0–47.0)
HGB: 10.5 g/dL — AB (ref 12.0–16.0)
LYMPHS PCT: 78.3 %
Lymphocyte #: 14.9 x10 3/mm — ABNORMAL HIGH (ref 1.0–3.6)
MCH: 29.9 pg (ref 26.0–34.0)
MCHC: 31.6 g/dL — ABNORMAL LOW (ref 32.0–36.0)
MCV: 95 fL (ref 80–100)
Monocyte #: 0.4 x10 3/mm (ref 0.2–0.9)
Monocyte %: 2.2 %
Neutrophil #: 3.5 x10 3/mm (ref 1.4–6.5)
Neutrophil %: 18.5 %
PLATELETS: 237 x10 3/mm (ref 150–440)
RBC: 3.53 10*6/uL — ABNORMAL LOW (ref 3.80–5.20)
RDW: 15.4 % — AB (ref 11.5–14.5)
WBC: 19 x10 3/mm — AB (ref 3.6–11.0)

## 2014-09-02 LAB — LACTATE DEHYDROGENASE: LDH: 180 U/L (ref 81–246)

## 2014-09-02 LAB — CREATININE, SERUM
CREATININE: 1.51 mg/dL — AB (ref 0.60–1.30)
EGFR (African American): 44 — ABNORMAL LOW
EGFR (Non-African Amer.): 37 — ABNORMAL LOW

## 2014-09-03 LAB — KAPPA/LAMBDA FREE LIGHT CHAINS (ARMC)

## 2014-09-07 ENCOUNTER — Ambulatory Visit: Payer: Self-pay | Admitting: Internal Medicine

## 2014-09-20 ENCOUNTER — Ambulatory Visit: Payer: Self-pay | Admitting: Internal Medicine

## 2015-02-08 DIAGNOSIS — M79671 Pain in right foot: Secondary | ICD-10-CM | POA: Diagnosis not present

## 2015-02-08 DIAGNOSIS — M722 Plantar fascial fibromatosis: Secondary | ICD-10-CM | POA: Diagnosis not present

## 2015-02-08 DIAGNOSIS — E119 Type 2 diabetes mellitus without complications: Secondary | ICD-10-CM | POA: Diagnosis not present

## 2015-03-12 DIAGNOSIS — D638 Anemia in other chronic diseases classified elsewhere: Secondary | ICD-10-CM | POA: Diagnosis not present

## 2015-03-12 DIAGNOSIS — N183 Chronic kidney disease, stage 3 (moderate): Secondary | ICD-10-CM | POA: Diagnosis not present

## 2015-03-12 DIAGNOSIS — C9110 Chronic lymphocytic leukemia of B-cell type not having achieved remission: Secondary | ICD-10-CM | POA: Diagnosis not present

## 2015-03-12 DIAGNOSIS — E782 Mixed hyperlipidemia: Secondary | ICD-10-CM | POA: Diagnosis not present

## 2015-03-12 DIAGNOSIS — E1122 Type 2 diabetes mellitus with diabetic chronic kidney disease: Secondary | ICD-10-CM | POA: Diagnosis not present

## 2015-03-12 DIAGNOSIS — I10 Essential (primary) hypertension: Secondary | ICD-10-CM | POA: Diagnosis not present

## 2015-03-12 DIAGNOSIS — M1990 Unspecified osteoarthritis, unspecified site: Secondary | ICD-10-CM | POA: Diagnosis not present

## 2015-03-12 DIAGNOSIS — Z0000 Encounter for general adult medical examination without abnormal findings: Secondary | ICD-10-CM | POA: Diagnosis not present

## 2015-03-12 NOTE — H&P (Signed)
PATIENT NAME:  Tammy [name], Tammy [name] MR#:  [id number] DATE OF BIRTH:  12-04-1945  DATE OF ADMISSION:  12/25/2012  PRIMARY CARE PHYSICIAN: Benita Stabile, MD [facility], [facility])   CHIEF COMPLAINT AND HISTORY OF PRESENT ILLNESS: Confused, not good balance, over the last 3 days not feeling great. She usually checks her sugar in the a.m. and it has been in the 90 range. This morning it was 71, at 9:00 a.m. This morning she had worsening confusion, not very good balance, and she was seeing double. She came to the ER for further evaluation. On chemistry glucose was 48 at 11:02 and then a repeat fingerstick at 12:59 was 25. An amp of D50 was pushed. Hospitalist services were contacted for further evaluation regarding hypoglycemia and altered mental status. Of note, the patient does take glimepiride 4 mg daily and pioglitazone 45 mg daily and she did take both of those medications this morning.   PAST MEDICAL HISTORY: Diabetes, hypertension, arthritis, chronic kidney disease.   PAST SURGICAL HISTORY: Left knee replacement.   ALLERGIES: SULFA.   MEDICATIONS: As per prescription writer include benazepril/hydrochlorothiazide 20/12.5 mg 1 capsule daily, glimepiride 4 mg daily, pioglitazone 45 mg daily, spironolactone 25 mg daily and verapamil extended release 120 mg daily.   SOCIAL HISTORY: No smoking. No alcohol. No drug use. She works as a [employer] fundraiser.   FAMILY HISTORY: Father died of stomach cancer. Mother living at age 68. Has early dementia, vascular problems and diabetes. Sister died of stomach cancer. A brother died of agent orange.   REVIEW OF SYSTEMS: CONSTITUTIONAL: Positive for chills. No fever. No sweats. Positive for weight gain. Positive for fatigue, total body.  EYES: She did have double vision this morning. She does wear reading glasses with fine print. EARS, NOSE, MOUTH AND THROAT: No hearing loss. No sore throat. No difficulty swallowing.  CARDIOVASCULAR: No chest pain. No  palpitations.  RESPIRATORY: No shortness of breath. No sputum. No hemoptysis.  GASTROINTESTINAL: A few days ago she did have vomiting 3 times and had some abdominal pain with that but no diarrhea. No hematemesis. She does have constipation. No bright red blood per rectum. No melena.  GENITOURINARY: No burning on urination or hematuria.  MUSCULOSKELETAL: Positive for arthritis pain.  INTEGUMENT: No rashes or eruptions.  NEUROLOGIC: No fainting or blackouts but some confusion this morning.  ENDOCRINE: No thyroid problems.  HEMATOLOGIC/LYMPHATIC: No anemia.   PHYSICAL EXAMINATION: VITAL SIGNS: Temperature 97.9, pulse 72, respirations 18, blood pressure 123/46 and pulse oximetry 100% on room air.  GENERAL: No respiratory distress.  EYES: Conjunctivae and lids normal. Pupils equal, round and reactive to light. Extraocular muscles intact. No nystagmus.  EARS, NOSE, MOUTH, AND THROAT: Tympanic membranes no erythema. Nasal mucosa no erythema. Throat no erythema. No exudate seen. Lips and gums no lesions.  NECK: No JVD. No bruits. No lymphadenopathy. No thyromegaly. No thyroid nodules palpated.  LUNGS: Clear to auscultation. No use of accessory muscles to breathe. No rhonchi, rales or wheeze heard.  HEART: S1 and S2 normal. II/VI systolic ejection murmur. No gallops. No rubs. Carotid upstroke 2+ bilaterally. No bruits.  EXTREMITIES: Dorsalis pedis pulses 2+ bilaterally. Trace edema of the lower extremities.  ABDOMEN: Soft, nontender. No organosplenomegaly. Normoactive bowel sounds. No masses felt.  LYMPHATIC: No lymph nodes in the neck.  MUSCULOSKELETAL: No clubbing. Trace edema. No cyanosis.  SKIN: No rashes or ulcers seen.  NEUROLOGIC: Cranial nerves II through XII grossly intact. Deep tendon reflexes 1+ on the right lower extremity, difficult to  elicit on the left lower extremity. Power 5 out of 5 in upper and lower extremities. Sensation grossly in tact. No tremor.  PSYCHIATRIC: The patient is  alert and oriented to person, place and time.   LABORATORY AND RADIOLOGICAL DATA: Chest x-ray showed no acute cardiopulmonary disease.   Urinalysis negative.   White blood cell count 39.3, H and H 10.4 and 32.3 and platelet count 197. Glucose on the chemistry 48, BUN 37, creatinine 1.63, sodium 137, potassium 4.6, chloride 105, CO2 23 and calcium 8.9. Liver function tests normal. Troponin negative. CPK elevated at 400.  CT scan of the head showed no acute intracranial process.   Repeat fingerstick 25.   EKG showed sinus rhythm, short PR, incomplete left bundle branch block and left axis deviation.   ASSESSMENT AND PLAN:  1. Severe hypoglycemia with diabetes and encephalopathy. We will start on a D10 drip, check fingersticks q. 1 hour and check a hemoglobin A1c to see how good her bad the sugars have been controlled over the past 12 weeks. Glimepiride can cause a prolonged hypoglycemia and may end up being here for a few days. We will hold glimepiride and Actos at this point. Unclear etiology, if it is just the glimepiride and acute renal causing or whether there is something underlying. We will continue to monitor closely.  2. Leukocytosis. We will check a manual differential to rule out any other process. There are no signs of infection at this point, but I will get blood cultures and urine culture. This could also be a stress reaction from the hypoglycemia, but this is an elevated white blood cell count. The patient has never been told this in the past so we will have to check a CBC in the a.m. also. 3. Acute renal failure versus chronic kidney disease, stage III. We will watch closely with fluids, hold benazepril/HCT and spironolactone at this point.  4. Hypertension. Continue only verapamil.  5. Anemia. Continue to monitor closely with hydration, check a stool guaiac.  6. Abnormal EKG. We will monitor on telemetry and continue to monitor closely on verapamil also.        The patient  is critically ill with low sugar.   TIME SPENT ON ADMISSION: 50 minutes.  ____________________________ Tana Conch. Leslye Peer, MD rjw:sb D: 12/25/2012 13:59:32 ET T: 12/25/2012 14:09:15 ET JOB#: [id number]  cc: Tana Conch. Leslye Peer, MD, <Dictator> Benita Stabile, MD [facility], [facility])  Marisue Brooklyn MD ELECTRONICALLY SIGNED 12/25/2012 61:60

## 2015-03-12 NOTE — Discharge Summary (Signed)
PATIENT NAME:  Tammy [name], Tammy [name] MR#:  [id number] DATE OF BIRTH:  1946-01-07  DATE OF ADMISSION:  12/25/2012 DATE OF DISCHARGE:  12/27/2012  ADMITTING DIAGNOSES:  Confusion, hypoglycemia.   DISCHARGE DIAGNOSES: 1.  Acute encephalopathy due to hypoglycemia, now mental status back to baseline.  2.  Hypoglycemia in the setting of acute gastroenteritis, now her sugars are staying in the 130s.  Her sulfonylurea and Actos has been held.  The patient needs to monitor her blood sugars at home and can be re-resumed per her primary care's direction.  3.  Leukocytosis.  Initially, patient reported no history of this.  On further questioning she reports that she has chronic leukocytosis and has been followed by hematologist.  Her WBC count seems to be close to her baseline.  She will need to follow up with her hematologist.  4.  Borderline iron deficiency anemia.  The patient started on supplemental iron.  She has a colonoscopy scheduled later this year which she needs to keep.  5.  Acute on chronic renal failure, likely stage II, creatinine is currently improved since admission.  6.  Hypertension.  7.  Abnormal EKG with patient being asymptomatic, needs to follow up as an outpatient if she develops any cardiac symptoms.   PERTINENT LABORATORY AND EVALUATIONS:  Admitting WBC count was 39.3, hemoglobin 10.4, platelet count 197.  Blood cultures x 2 no growth.  Urine culture shows only 7000 CFU of gram-negative rods, nitrites negative, leukocytes negative.  EKG showed a sinus rhythm with short PR, left axis deviation, nonspecific intra-articular ventricular block.  Most recent BMP today, glucose is 130, creatinine 1.32, sodium 139, potassium 5.1, chloride 109, CO2 is 24.  Her iron studies showed an iron of 51, ferritin of 69, folic acid 01.0.  Hemoglobin A1c was 6.9.  LFTs were normal.  Troponin less than 0.02.  CPK was 400.  Most recent WBC count today 27.5, hemoglobin 9.7, platelet count was 191.  Blood in the stool  was negative.   HOSPITAL COURSE:  Please refer to history and physical done by the admitting physician.  The patient is a 69 year old African American female who has history of diabetes, hypertension, some mild chronic kidney disease, osteoarthritis, was brought to the ED after she was found to be confused at home.  The patient was noted to have a very low blood sugar.  Prior to that over the weekend she was not feeling well.  She was having some nausea and throwing up and had not eaten well prior to this episode.  Likely the cause for her hypoglycemia.  The patient because of her confusion and hypoglycemia, we were asked to admit the patient.  She was initially started on D10 which was subsequently discontinued.  Now her sugars are staying in the 120s to 130s.  Without her diabetic regimen.  At this time we will continue to hold her diabetic treatment.  Her mental status is back to baseline.  The patient also on presentation was noted to have significant leukocytosis of 39,000.  When she was initially admitted she did not mention any history of this so she was ruled out for an infection.  She also did not tell me that she had a history of having leukocytosis and then when I went back to talk to her about the white blood cell count she stated that she has chronic elevated white blood cell count with white blood cell count in the 26,000 range and is followed by hematologist.  At this  time her WBC count is close to her baseline.  She needs to return back to her primary care's office next week and have this repeated and follow up with her hematologist.  She was also noted to be dehydrated on presentation and was noted to be in acute on chronic renal failure.  With hydration her renal function has improved.  She is also noted to have borderline iron deficiency anemia and is started on iron.  Her guaiac for stools were negative.  She is scheduled to have a colonoscopy later this year.  At this time she is doing much  better and is stable for discharge.   DISCHARGE MEDICATIONS:  Verapamil 120 1 tab by mouth daily in the morning, benazepril HCTZ 12.5/20 1 tab by mouth daily, aspirin 81 1 tab by mouth daily, Tylenol 650 mg q. 4 as needed, iron sulfate 325 mg 1 tab by mouth twice daily.  The patient is told to stop taking spironolactone, pioglitazone and glimepiride.   DIET:  Low sodium, low fat, low cholesterol diet.   ACTIVITY:  As tolerated.   DISCHARGE FOLLOW-UP:  Follow up with Dr. Deforest Hoyles next week at [facility] in [place].  Follow up with primary hematologist for elevated WBC count in 2 to 4 weeks.  The patient will have a CBC and BMP check next week in Dr. Sherilyn Cooter office.  She is also to keep a log of her blood glucose to take to her primary care provider.    TIME SPENT:  Forty minutes spent.      ____________________________ Lafonda Mosses. Posey Pronto, MD shp:ea D: 12/27/2012 14:33:27 ET T: 12/28/2012 03:43:04 ET JOB#: [id number]  cc: Paulmichael Schreck H. Posey Pronto, MD, <Dictator> Alric Seton MD ELECTRONICALLY SIGNED 12/31/2012 10:17

## 2015-03-13 NOTE — Op Note (Signed)
PATIENT NAME:  Tammy [name], Tammy [name] MR#:  [id number] DATE OF BIRTH:  1946/11/12  DATE OF PROCEDURE:  03/11/2014  PREOPERATIVE DIAGNOSIS: Degenerative arthrosis of the right knee.   POSTOPERATIVE DIAGNOSES: Degenerative arthrosis of the right knee.   PROCEDURE PERFORMED: Right total knee arthroplasty using computer-assisted navigation.   SURGEON: Laurice Record. Holley Bouche., M.D.   ASSISTANT: Vance Peper, PA (required to maintain retraction throughout the procedure).   ANESTHESIA: Spinal.   ESTIMATED BLOOD LOSS: 150 mL.   FLUIDS REPLACED: 1600 mL of crystalloid.   TOURNIQUET TIME: 86 minutes.   DRAINS: Two medium drains to reinfusion system.   SOFT TISSUE RELEASES: Anterior cruciate ligament, posterior cruciate ligament, deep medial collateral ligament, and patellofemoral ligament.   IMPLANTS UTILIZED: DePuy PFC Sigma size 3 posterior stabilized femoral component (cemented), size 3 MBT tibial component (cemented), 35 mm 3 peg oval dome patella (cemented), and a 10 mm stabilized rotating platform polyethylene insert. Gentamicin bone cement was utilized due to the patient's history of diabetes.   INDICATIONS FOR SURGERY: The patient is a 69 year old female who has been seen for complaints of progressive right knee pain. X-rays demonstrated severe degenerative changes in tricompartmental fashion with relative varus deformity. After discussion of the risks and benefits of surgical intervention, the patient expressed understanding of the risks and benefits and agreed with plans for surgical intervention.   PROCEDURE IN DETAIL: The patient was brought into the operating room and, after adequate spinal anesthesia was achieved, a tourniquet was placed on the patient's upper right thigh. The patient's right knee and leg were cleaned and prepped with alcohol and DuraPrep and draped in the usual sterile fashion. A "timeout" was performed as per usual protocol. The right lower extremity was exsanguinated using an  Esmarch, and the tourniquet was inflated to 300 mmHg. An anterior longitudinal incision was made, followed by a standard mid vastus approach. A small effusion was evacuated. The deep fibers of the medial collateral ligament were elevated in a subperiosteal fashion off the medial flare of the tibia so as to maintain a continuous soft tissue sleeve. The patella was subluxed laterally, and the patellofemoral ligament was incised. Inspection of the knee demonstrated severe degenerative changes in tricompartmental fashion. Prominent osteophytes were debrided using a rongeur. Anterior and posterior cruciate ligaments were excised. Two 4.0 mm Schanz pins were inserted into the femur and into the tibia for attachment of the array of trackers used for computer-assisted navigation. Hip center was identified using circumduction technique. Distal landmarks were mapped using the computer. The distal femur and proximal tibia were mapped using the computer. Distal femoral cutting guide was positioned using computer-assisted navigation so as to achieve a 5-degree distal valgus cut. Cut was performed and verified using the computer. Distal femur was sized, and it was felt that a size 3 femoral component was appropriate. A size 3 cutting guide was positioned, and the anterior cut was performed and verified using the computer. This was followed by completion of the posterior and chamfer cuts. Femoral cutting guide for central box was then positioned, and the central box cut was performed. Attention was then directed to the proximal tibia. Medial and lateral menisci were excised. The extramedullary tibial cutting guide was positioned using computer-assisted navigation so as to achieve 0-degree varus and valgus alignment and 0-degree posterior slope. Cut was performed and verified using the computer. The proximal tibia was sized, and it was felt that a size 3 tibial tray was appropriate. Tibial and femoral trials were inserted.  Posterior  osteophytes were debrided off of the femoral condyles. A 10 mm polyethylene trial was inserted, and the knee was shown to have good medial and lateral soft tissue balancing. Finally, the patella was cut and prepared so as to accommodate a 35 mm 3 peg oval dome patella. Patellar trial was placed, and the knee was placed through range of motion with excellent patellar tracking appreciated. The femoral trial was removed. Central post hole for the tibial component was reamed, followed by insertion of a keel punch. Tibial trials were then removed. The cut surfaces of bone were irrigated with copious amounts of normal saline with antibiotic solution using pulsatile lavage and then suctioned dry. Polymethylmethacrylate cement with gentamicin was prepared in the usual fashion using a vacuum mixer. Cement was applied to the cut surface of the proximal tibia as well as along the undersurface of a size 3 MBT tibial component. The tibial component was positioned and impacted into place. Excess cement was removed using [employer] fast streamer. Cement was then applied to the cut surfaces of the femur as well as along the posterior flanges of a size 3 posterior stabilized femoral component. Femoral component was positioned and impacted into place. Excess cement was removed using [employer] fast streamer. A 10 mm polyethylene trial was inserted and the knee was brought into full extension with steady axial compression applied. Finally, cement was applied to the backside of a 35 mm 3 peg oval dome patella, and the patellar component was positioned and patellar clamp applied. Excess cement was removed using [employer] fast streamer.   After adequate curing of cement, the tourniquet was deflated after a total tourniquet time of 86 minutes. Hemostasis was achieved using electrocautery. The knee was irrigated with copious amounts of normal saline with antibiotic solution using pulsatile lavage and then suctioned dry. The knee was inspected for any  residual cement debris. Then, 20 mL of 1.3% Exparel in 40 mL of normal saline was injected along the posterior capsule, medial and lateral gutters, and along the arthrotomy site. A 10 mm stabilized rotating platform polyethylene insert was inserted, and the knee was placed through a range of motion with excellent mediolateral soft tissue balancing appreciated both in flexion and in extension and excellent patellar tracking noted. Two medium drains were placed in the wound bed and brought out through a separate stab incision to be attached to a reinfusion system. Trackers were removed. The medial parapatellar portion of the incision was reapproximated using interrupted sutures of #1 Vicryl. The subcutaneous tissue was approximated in layers using first #0 Vicryl, followed by #2-0 Vicryl. Then, 30 mL of 0.25% Marcaine with epinephrine was injected along the incision site in the subcutaneous tissue. Skin was then approximated using skin staples. Sterile dressing was applied.   The patient tolerated the procedure well. She was transported to the recovery room in stable condition.    ____________________________ Laurice Record. Holley Bouche., MD jph:gb D: 03/12/2014 00:05:50 ET T: 03/12/2014 02:58:26 ET JOB#: [id number]  cc: Laurice Record. Holley Bouche., MD, <Dictator> JAMES P Holley Bouche MD ELECTRONICALLY SIGNED 03/14/2014 18:20

## 2015-03-13 NOTE — Discharge Summary (Signed)
PATIENT NAME:  Tammy [name], GOATLEY MR#:  [id number] DATE OF BIRTH:  [birth year]  DATE OF ADMISSION:  03/11/2014 DATE OF DISCHARGE:  03/14/2014   DICTATING FOR: Jeneen Rinks P. Holley Bouche., MD  ADMITTING DIAGNOSIS: Degenerative arthrosis of right knee.   DISCHARGE DIAGNOSIS: Degenerative arthrosis of right knee.   HISTORY: The patient is a pleasant 69 year old female who has been followed at [facility] for discomfort to the right knee. She had undergone cortisone injections as well as Synvisc injections with no improvement of condition. She had gotten to the point where she was taking narcotics on a regular basis because of the pain. She had not seen any improvement in her condition. She states that the pain had increased to the point that it was significantly interfering with her activities of daily living. The patient is noted to have a chronic history of leukocytosis that has been going on for approximately 3 years and is currently being seen by Dr. Inez Pilgrim and her primary care. The etiology of this is unknown. X-rays taken in [facility] showed severe degenerative changes in a tricompartmental fashion with relative varus deformity noted. After discussion of the risks and benefits of surgical intervention, the patient expressed her understanding of the risks and benefits and agreed for plans for surgical intervention.   PROCEDURE: Right total knee arthroplasty using computer-assisted navigation.   ANESTHESIA: Spinal.   SOFT TISSUE RELEASE: Anterior cruciate ligament, posterior cruciate ligament, deep medial collateral ligaments as well as patellofemoral ligament.   IMPLANTS UTILIZED: DePuy PFC Sigma size 3 stabilized femoral component (cemented), size 3 MBT tibial component (cemented), 35 mm 3 pegged oval dome patella (cemented), and a 10 mm stabilized rotating platform polyethylene insert. Gentamicin bone cement was utilized due to the patient's history of diabetes as well as a chronic  history of leukocytosis.   HOSPITAL COURSE: The patient tolerated the procedure very well. She had no complications. She was then taken to the PACU, where she was stabilized and then transferred to the orthopedic floor. The patient began receiving anticoagulation therapy of Lovenox 30 mg subcutaneous q.12 hours per anesthesia and pharmacy protocol. She was fitted with TED stockings bilaterally. These were allowed to be removed 1 hour per 8-hour shift. The right one was applied on day 2 following removal of the Hemovac and dressing change. The patient was also fitted with the AVI compression foot pumps bilaterally set at 80 mmHg. Her calves have been nontender. There has been no evidence of any DVTs. Heels were elevated off the bed using rolled towels.   The patient has denied any chest pain or shortness of breath. Vital signs have been stable. She has been afebrile. Hemodynamically, she was stable. No transfusions were given other than the Autovac transfusion given the first 6 hours postoperatively.   The patient's IV, Foley and Hemovac were discontinued on day 2 along with a dressing change. The wound was free of any drainage or signs of infection. Polar Care was reapplied to the surgical leg, maintaining a temperature of 40 to 50 degrees Fahrenheit.   Physical therapy was initiated on day 1 for gait training and transfers. She has done well. However, she has been limited due to conditioning. Motion of the knee was 0 to 96, but ambulation was no more than 100 feet. Occupational therapy was also initiated on day 1 for ADLs and assistive devices. Due to her inability to walk more than 100 feet, it was felt that, for her safety, it was best to go  to rehab for a short term until she got her strength built back up.   DISPOSITION: The patient is being discharged to a skilled nursing facility in improved stable condition.   DISCHARGE INSTRUCTIONS:  1. She is to continue weight-bearing as tolerated.  2.  Continue to elevate the lower extremities on rolled towels.  3. Continue with TED stockings bilaterally. These are allowed to be removed 1 hour per 8-hour shift.  4. Continue with incentive spirometer q.1 hour while awake. Encourage the patient to cough and deep breathe q.2 hours while awake.  5. She is placed on a diabetic diet.  6. She is to continue the Polar Care, maintaining a temperature of 40 to 50 degrees Fahrenheit. This to be worn around the clock.  7. Change dressing as needed.  8. She has a followup appointment in [facility] on May 7th at 8:45. Will remove the staples at that time. She will continue PT for gait training and transfers. OT for ADLs and assistive devices.   DRUG ALLERGIES: SULFA DRUGS.   MEDICATIONS:  1. Benadryl 20/12.5 one tablet q.a.m.  2. Senokot-S 1 tablet b.i.d.  3. Ferrous sulfate 325 mg daily.  4. Pantoprazole 40 mg b.i.d.  5. Actos 45 mg q.a.m.  6. VESIcare 5 mg q.24 hours.  7. Verapamil 360 mg q.a.m.  8. Lovenox 30 mg subcutaneous q.12 hours for 14 days, then discontinue and begin taking one 81 mg enteric-coated aspirin.  9. Amaryl 4 mg b.i.d. with meals.  10. Insulin sliding scale Novolin R injections. 11. Tylenol ES 500 to 1000 mg q.4-6 hours p.r.n. for temperatures greater than 100.4. 12. Mylanta 30 mL q.6 hours p.r.n.  13. Dulcolax suppositories 10 mg rectally daily p.r.n. 14. Lactulose 30 mL b.i.d. p.r.n. 15. Milk of Magnesia 30 mL b.i.d. p.r.n.  16. Oxycodone 5 to 10 mg q.4-6 hours p.r.n.  17. Tramadol 50 to 100 mg q.4-6 hours p.r.n.  18. Enema soapsuds if no results with Milk of Magnesia or Dulcolax.   PAST MEDICAL HISTORY:  1. Chronic leukocytosis.  2. Migraines.  3. Hypertension.  4. Diabetes.  5. Heart murmur.  6. Lumbar radiculitis.   ____________________________ Vance Peper, PA jrw:[id number] D: 03/14/2014 07:15:02 ET T: 03/14/2014 07:47:31 ET JOB#: [id number]  cc: Vance Peper, PA, <Dictator> JON WOLFE PA ELECTRONICALLY SIGNED  03/17/2014 20:03

## 2015-04-06 DIAGNOSIS — J069 Acute upper respiratory infection, unspecified: Secondary | ICD-10-CM | POA: Diagnosis not present

## 2015-05-31 DIAGNOSIS — J069 Acute upper respiratory infection, unspecified: Secondary | ICD-10-CM | POA: Diagnosis not present

## 2015-06-02 ENCOUNTER — Other Ambulatory Visit: Payer: Self-pay

## 2015-06-02 DIAGNOSIS — C9110 Chronic lymphocytic leukemia of B-cell type not having achieved remission: Secondary | ICD-10-CM

## 2015-06-03 ENCOUNTER — Encounter: Payer: Commercial Managed Care - HMO | Admitting: Family Medicine

## 2015-06-03 ENCOUNTER — Other Ambulatory Visit: Payer: Commercial Managed Care - HMO

## 2015-06-03 ENCOUNTER — Inpatient Hospital Stay (HOSPITAL_BASED_OUTPATIENT_CLINIC_OR_DEPARTMENT_OTHER): Payer: Commercial Managed Care - HMO | Admitting: Hematology and Oncology

## 2015-06-03 ENCOUNTER — Inpatient Hospital Stay: Payer: Commercial Managed Care - HMO | Admitting: Oncology

## 2015-06-03 ENCOUNTER — Other Ambulatory Visit: Payer: Self-pay

## 2015-06-03 ENCOUNTER — Encounter: Payer: Self-pay | Admitting: Hematology and Oncology

## 2015-06-03 ENCOUNTER — Inpatient Hospital Stay: Payer: Commercial Managed Care - HMO

## 2015-06-03 ENCOUNTER — Inpatient Hospital Stay: Payer: Commercial Managed Care - HMO | Attending: Hematology and Oncology

## 2015-06-03 ENCOUNTER — Ambulatory Visit: Payer: Commercial Managed Care - HMO | Admitting: Family Medicine

## 2015-06-03 DIAGNOSIS — Z7982 Long term (current) use of aspirin: Secondary | ICD-10-CM

## 2015-06-03 DIAGNOSIS — Z79899 Other long term (current) drug therapy: Secondary | ICD-10-CM | POA: Insufficient documentation

## 2015-06-03 DIAGNOSIS — D509 Iron deficiency anemia, unspecified: Secondary | ICD-10-CM

## 2015-06-03 DIAGNOSIS — I10 Essential (primary) hypertension: Secondary | ICD-10-CM | POA: Diagnosis not present

## 2015-06-03 DIAGNOSIS — C9110 Chronic lymphocytic leukemia of B-cell type not having achieved remission: Secondary | ICD-10-CM

## 2015-06-03 DIAGNOSIS — E119 Type 2 diabetes mellitus without complications: Secondary | ICD-10-CM | POA: Insufficient documentation

## 2015-06-03 DIAGNOSIS — E781 Pure hyperglyceridemia: Secondary | ICD-10-CM | POA: Insufficient documentation

## 2015-06-03 LAB — CBC WITH DIFFERENTIAL/PLATELET
BASOS ABS: 0.1 10*3/uL (ref 0–0.1)
Basophils Relative: 0 %
EOS ABS: 0.1 10*3/uL (ref 0–0.7)
Eosinophils Relative: 1 %
HEMATOCRIT: 35.3 % (ref 35.0–47.0)
Hemoglobin: 11.2 g/dL — ABNORMAL LOW (ref 12.0–16.0)
Lymphocytes Relative: 80 %
Lymphs Abs: 15.3 10*3/uL — ABNORMAL HIGH (ref 1.0–3.6)
MCH: 29.6 pg (ref 26.0–34.0)
MCHC: 31.6 g/dL — AB (ref 32.0–36.0)
MCV: 93.8 fL (ref 80.0–100.0)
Monocytes Absolute: 0.6 10*3/uL (ref 0.2–0.9)
Monocytes Relative: 3 %
Neutro Abs: 3.1 10*3/uL (ref 1.4–6.5)
Neutrophils Relative %: 16 %
Platelets: 212 10*3/uL (ref 150–440)
RBC: 3.76 MIL/uL — ABNORMAL LOW (ref 3.80–5.20)
RDW: 14.6 % — AB (ref 11.5–14.5)
WBC: 19.1 10*3/uL — ABNORMAL HIGH (ref 3.6–11.0)

## 2015-06-03 LAB — LACTATE DEHYDROGENASE: LDH: 180 U/L (ref 98–192)

## 2015-06-03 LAB — CREATININE, SERUM
CREATININE: 1.52 mg/dL — AB (ref 0.44–1.00)
GFR calc Af Amer: 40 mL/min — ABNORMAL LOW (ref >60)
GFR, EST NON AFRICAN AMERICAN: 34 mL/min — AB (ref >60)

## 2015-06-03 LAB — URIC ACID: Uric Acid, Serum: 7.4 mg/dL — ABNORMAL HIGH (ref 2.3–6.6)

## 2015-06-03 NOTE — Progress Notes (Signed)
Pt today for follow up regarding CLL; offers no complaints

## 2015-06-03 NOTE — Progress Notes (Signed)
[facility] day:  06/03/2015  Chief Complaint: Tammy [name] is a 69 y.o. female with chronic lymphocytic leukemia (CLL) who is seen for reassessment.  HPI:  The was diagnosed with CLL in 07/2009 by Dr. Rodena Piety.  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  Absolue lymphocyte count (ALC) was initially 9000.  Additional labs at that time included a hemoglobin 10.2.  Anemia work-up included a ferritin of 12 (low), iron saturation 19%.  Normal studies inclluded a B12, epo level, and electrophoresis.  She was initially seen by Dr. Inez Pilgrim on 01/10/2013.  Notes indicate WBC was 30,000 in 01/2012.  She was last seen by Dr. Inez Pilgrim on 09/02/2014.  At that time, she denied any B symptoms.  Exam revealed no adenopathy or hepatosplenomegaly. CBC included a hematocrit of 33.4, hemoglobin 10.5, MCV 95, platelets 237,000, white count 19,000 with an ANC of 3500. Differential included 18% segs, 78% lymphs, and 2% monocytes. Absolute lymphocyte count was 14,900.  Kappa free light chains were 29.67, lambda free light chains 12.77, and a ratio of 2.32 (0.26-1.65).  Creatinine was 1.51 with a creatinine clearance of 44 ml/minute.  She has a history of iron deficiency. Notes from Dr. Inez Pilgrim revealed a hemoglobin of 9.7 and iron saturation of 16%.  B12 was normal. Stool guaiacs were negative. There have been ongoing discussions regarding the need for a colonoscopy. The patient stated her colonoscopy is due in 2017 or 2018.  She states that her diet is excellent.  She eats meat 4-5 times a week.  She denies pica.  She denies any B symptoms. She has had no infections. She denies any bruising or bleeding.  Past Medical History  Diagnosis Date  . Unspecified deficiency anemia   . Other elevated white blood cell count   . Hypertension   . Diabetes mellitus   . Hyperlipidemia   . Hypertriglyceridemia   . DA (degenerative arthritis)   . Allergic rhinitis      Past Surgical History  Procedure Laterality Date  . Colonoscopy  2008    most recent    No family history on file.  Social History:  has no tobacco, alcohol, and drug history on file.  The patient is alone today.  Allergies:  Allergies  Allergen Reactions  . Sulfa Antibiotics     Current Medications: Current Outpatient Prescriptions  Medication Sig Dispense Refill  . aspirin 81 MG tablet Take 81 mg by mouth daily.    . benazepril-hydrochlorthiazide (LOTENSIN HCT) 20-12.5 MG per tablet Take 1 tablet by mouth daily.    . cholecalciferol (VITAMIN D) 400 UNITS TABS Take 400 Units by mouth daily.    . ferrous sulfate 325 (65 FE) MG tablet Take 325 mg by mouth daily with breakfast.    . glimepiride (AMARYL) 4 MG tablet Take 4 mg by mouth daily before breakfast.    . HYDROcodone-acetaminophen (NORCO) 5-325 MG per tablet Take 1 tablet by mouth every 6 (six) hours as needed.    Marland Kitchen ibuprofen (ADVIL,MOTRIN) 200 MG tablet Take 400 mg by mouth every 6 (six) hours as needed.    . metFORMIN (GLUCOPHAGE) 1000 MG tablet Take 1,000 mg by mouth 2 (two) times daily with a meal.    . Multiple Vitamins-Minerals (MULTIVITAMIN PO) Take by mouth.    . omega-3 acid ethyl esters (LOVAZA) 1 G capsule Take 1 g by mouth 2 (two) times daily.    . pioglitazone (ACTOS) 45 MG tablet Take 45  mg by mouth daily.    Marland Kitchen spironolactone (ALDACTONE) 25 MG tablet Take 25 mg by mouth daily.    . verapamil (CALAN-SR) 180 MG CR tablet Take 180 mg by mouth at bedtime.     No current facility-administered medications for this visit.    Review of Systems:  GENERAL:  Feels good.  Active.  No fevers, sweats or weight loss. PERFORMANCE STATUS (ECOG):  2 HEENT:  No visual changes, runny nose, sore throat, mouth sores or tenderness. Lungs: No shortness of breath or cough.  No hemoptysis. Cardiac:  No chest pain, palpitations, orthopnea, or PND. GI:  No nausea, vomiting, diarrhea, constipation, melena or hematochezia. GU:   No urgency, frequency, dysuria, or hematuria. Musculoskeletal:  No back pain.  No joint pain.  No muscle tenderness. Extremities:  No pain or swelling. Skin:  No rashes or skin changes. Neuro:  No headache, numbness or weakness, balance or coordination issues. Endocrine:  Diabetes.  No thyroid issues, hot flashes or night sweats. Psych:  No mood changes, depression or anxiety. Pain:  No focal pain. Review of systems:  All other systems reviewed and found to be negative.   Physical Exam:  Blood pressure 143/72, pulse 81, temperature 95.8 F (35.4 C), temperature source Tympanic, Height 5'9", weight 278 pounds 3.5 ounces (126.2 kg) GENERAL:  Well developed, well nourished, sitting comfortably in the exam room in no acute distress. MENTAL STATUS:  Alert and oriented to person, place and time. HEAD:  Brown curly hair.  Normocephalic, atraumatic, face symmetric, no Cushingoid features. EYES:  Brown eyes.  Pupils equal round and reactive to light and accomodation.  No conjunctivitis or scleral icterus. ENT:  Oropharynx clear without lesion.  Tongue normal. Mucous membranes moist.  RESPIRATORY:  Clear to auscultation without rales, wheezes or rhonchi. CARDIOVASCULAR:  Regular rate and rhythm without murmur, rub or gallop. ABDOMEN:  Soft, non-tender, with active bowel sounds, and no appreciable hepatosplenomegaly.  No masses. SKIN:  No rashes, ulcers or lesions. EXTREMITIES: No edema, no skin discoloration or tenderness.  No palpable cords. LYMPH NODES: No palpable cervical, supraclavicular, axillary or inguinal adenopathy  NEUROLOGICAL: Unremarkable. PSYCH:  Appropriate.   No visits with results within 3 Day(s) from this visit. Latest known visit with results is:  [facility] on 09/02/2014  Component Date Value Ref Range Status  . Kappa/Lambda Free Light Chains 09/02/2014    Final                   Value:========== TEST NAME ==========  ========= RESULTS =========  = REFERENCE RANGE  =  Kappa/Lambda Free Light Free K+L Lt Chains,Qn,S Free Kappa Lt Chains,S          [H  29.67 mg/L           ]        3.30-19.40 Free Lambda Lt Chains,S         [   12.77 mg/L           ]        5.71-26.30 Kappa/Lambda Ratio,S            [H  2.32                 ]         0.26-1.65               [facility]            No: [id number]  [address], [place], [place] [address]           Lindon Romp, MD         [phone]     . WBC 09/02/2014 19.0* 3.6-11.0 x10 3/mm  Final  . RBC 09/02/2014 3.53* 3.80-5.20 x10 6/mm  Final  . HGB 09/02/2014 10.5* 12.0-16.0 g/dL Final  . HCT 09/02/2014 33.4* 35.0-47.0 % Final  . MCV 09/02/2014 95  80-100 fL Final  . MCH 09/02/2014 29.9  26.0-34.0 pg Final  . MCHC 09/02/2014 31.6* 32.0-36.0 g/dL Final  . RDW 09/02/2014 15.4* 11.5-14.5 % Final  . Platelet 09/02/2014 237  150-440 x10 3/mm  Final  . Neutrophil % 09/02/2014 18.5   Final  . Lymphocyte % 09/02/2014 78.3   Final  . Monocyte % 09/02/2014 2.2   Final  . Eosinophil % 09/02/2014 0.6   Final  . Basophil % 09/02/2014 0.4   Final  . Neutrophil # 09/02/2014 3.5  1.4-6.5 x10 3/mm  Final  . Lymphocyte # 09/02/2014 14.9* 1.0-3.6 x10 3/mm  Final  . Monocyte # 09/02/2014 0.4  0.2-0.9 x10 3/mm  Final  . Eosinophil # 09/02/2014 0.1  0.0-0.7 x10 3/mm  Final  . Basophil # 09/02/2014 0.1  0.0-0.1 x10 3/mm  Final  . Creatinine 09/02/2014 1.51* 0.60-1.30 mg/dL Final  . EGFR (African American) 09/02/2014 44* >90m/min Final  . EGFR (Non-African Amer.) 09/02/2014 37* >651mmin Final   Comment: eGFR values <6060min/1.73 m2 may be an indication of chronic kidney disease (CKD). Calculated eGFR, using the MRDR Study equation, is useful in  patients with stable renal function. The eGFR calculation will not be reliable in acutely ill patients when serum creatinine is changing rapidly. It is not useful in patients on dialysis. The eGFR calculation may not be applicable to patients at the  low and high extremes of body sizes, pregnant women, and vegetarians.   . LMarland KitchenH 09/02/2014 180  81-246 Unit/L Final  . Uric Acid 09/02/2014 5.9  2.6-6.0 mg/dL Final    Assessment:  Tammy [name] a 69 56o. female with stage 0 CLL diagnosed in 07/2009.  She presented with lymphocytosis.  WBC has ranged between 13,600 -. 32,500 without a trend.  Platelet count is normal.  She has had no issues with infections, bruising or bleeding.    She has a history of iron deficiency anemia.  Diet is good.  She denies any bleeding.  She states her colonoscopy is due in 2017 or 2018.  She has chronic kidney disease (CrCl 45 ml/min).  Symptomatically, she denies any B symptoms.  Exam reveals no adenopathy or hepatosplenomegaly.  Plan: 1. Review entire medical history, diagnosis and management of CLL and iron deficiency anemia. 2. Labs today:  CBC with diff, Cr, LDH, FLCA, LDH, uric acid. 3. Guaiac cards x 3. 4. RTC in 6 months for MD assessment and labs (CBC with diff, BMP, ferritin, iron studies).   MelLequita AsalD  06/03/2015, 1:55 PM

## 2015-06-04 LAB — KAPPA/LAMBDA LIGHT CHAINS
KAPPA FREE LGHT CHN: 22.9 mg/L — AB (ref 3.30–19.40)
Kappa, lambda light chain ratio: 1.96 — ABNORMAL HIGH (ref 0.26–1.65)
Lambda free light chains: 11.67 mg/L (ref 5.71–26.30)

## 2015-06-06 DIAGNOSIS — D509 Iron deficiency anemia, unspecified: Secondary | ICD-10-CM | POA: Diagnosis not present

## 2015-06-06 DIAGNOSIS — E119 Type 2 diabetes mellitus without complications: Secondary | ICD-10-CM | POA: Diagnosis not present

## 2015-06-06 DIAGNOSIS — C9110 Chronic lymphocytic leukemia of B-cell type not having achieved remission: Secondary | ICD-10-CM | POA: Diagnosis not present

## 2015-06-06 DIAGNOSIS — E781 Pure hyperglyceridemia: Secondary | ICD-10-CM | POA: Diagnosis not present

## 2015-06-06 DIAGNOSIS — Z7982 Long term (current) use of aspirin: Secondary | ICD-10-CM | POA: Diagnosis not present

## 2015-06-06 DIAGNOSIS — I10 Essential (primary) hypertension: Secondary | ICD-10-CM | POA: Diagnosis not present

## 2015-06-06 DIAGNOSIS — Z79899 Other long term (current) drug therapy: Secondary | ICD-10-CM | POA: Diagnosis not present

## 2015-06-07 DIAGNOSIS — E781 Pure hyperglyceridemia: Secondary | ICD-10-CM | POA: Diagnosis not present

## 2015-06-07 DIAGNOSIS — I10 Essential (primary) hypertension: Secondary | ICD-10-CM | POA: Diagnosis not present

## 2015-06-07 DIAGNOSIS — E119 Type 2 diabetes mellitus without complications: Secondary | ICD-10-CM | POA: Diagnosis not present

## 2015-06-07 DIAGNOSIS — C9110 Chronic lymphocytic leukemia of B-cell type not having achieved remission: Secondary | ICD-10-CM | POA: Diagnosis not present

## 2015-06-07 DIAGNOSIS — D509 Iron deficiency anemia, unspecified: Secondary | ICD-10-CM | POA: Diagnosis not present

## 2015-06-07 DIAGNOSIS — Z7982 Long term (current) use of aspirin: Secondary | ICD-10-CM | POA: Diagnosis not present

## 2015-06-07 DIAGNOSIS — Z79899 Other long term (current) drug therapy: Secondary | ICD-10-CM | POA: Diagnosis not present

## 2015-06-07 NOTE — Progress Notes (Signed)
This encounter was created in error - please disregard.

## 2015-06-08 ENCOUNTER — Other Ambulatory Visit: Payer: Self-pay

## 2015-06-08 DIAGNOSIS — E119 Type 2 diabetes mellitus without complications: Secondary | ICD-10-CM | POA: Diagnosis not present

## 2015-06-08 DIAGNOSIS — E781 Pure hyperglyceridemia: Secondary | ICD-10-CM | POA: Diagnosis not present

## 2015-06-08 DIAGNOSIS — I10 Essential (primary) hypertension: Secondary | ICD-10-CM | POA: Diagnosis not present

## 2015-06-08 DIAGNOSIS — C9110 Chronic lymphocytic leukemia of B-cell type not having achieved remission: Secondary | ICD-10-CM

## 2015-06-08 DIAGNOSIS — Z7982 Long term (current) use of aspirin: Secondary | ICD-10-CM | POA: Diagnosis not present

## 2015-06-08 DIAGNOSIS — D509 Iron deficiency anemia, unspecified: Secondary | ICD-10-CM | POA: Diagnosis not present

## 2015-06-08 DIAGNOSIS — Z79899 Other long term (current) drug therapy: Secondary | ICD-10-CM | POA: Diagnosis not present

## 2015-06-08 LAB — OCCULT BLOOD X 1 CARD TO LAB, STOOL
Fecal Occult Bld: NEGATIVE
Fecal Occult Bld: NEGATIVE
Fecal Occult Bld: NEGATIVE

## 2015-07-01 DIAGNOSIS — Z96653 Presence of artificial knee joint, bilateral: Secondary | ICD-10-CM | POA: Diagnosis not present

## 2015-07-20 DIAGNOSIS — S8002XA Contusion of left knee, initial encounter: Secondary | ICD-10-CM | POA: Diagnosis not present

## 2015-07-20 DIAGNOSIS — M25562 Pain in left knee: Secondary | ICD-10-CM | POA: Diagnosis not present

## 2015-07-21 DIAGNOSIS — Z6841 Body Mass Index (BMI) 40.0 and over, adult: Secondary | ICD-10-CM | POA: Diagnosis not present

## 2015-07-21 DIAGNOSIS — M25562 Pain in left knee: Secondary | ICD-10-CM | POA: Diagnosis not present

## 2015-07-21 DIAGNOSIS — E669 Obesity, unspecified: Secondary | ICD-10-CM | POA: Diagnosis not present

## 2015-08-23 ENCOUNTER — Other Ambulatory Visit: Payer: Self-pay | Admitting: Internal Medicine

## 2015-08-23 DIAGNOSIS — Z1231 Encounter for screening mammogram for malignant neoplasm of breast: Secondary | ICD-10-CM

## 2015-09-13 ENCOUNTER — Ambulatory Visit
Admission: RE | Admit: 2015-09-13 | Discharge: 2015-09-13 | Disposition: A | Discharge status: Home / Self Care | Payer: Commercial Managed Care - HMO | Source: Ambulatory Visit | Attending: Internal Medicine | Admitting: Internal Medicine

## 2015-09-13 ENCOUNTER — Other Ambulatory Visit: Payer: Self-pay | Admitting: Internal Medicine

## 2015-09-13 DIAGNOSIS — Z1231 Encounter for screening mammogram for malignant neoplasm of breast: Secondary | ICD-10-CM | POA: Diagnosis not present

## 2015-09-15 DIAGNOSIS — R32 Unspecified urinary incontinence: Secondary | ICD-10-CM | POA: Diagnosis not present

## 2015-09-15 DIAGNOSIS — N183 Chronic kidney disease, stage 3 (moderate): Secondary | ICD-10-CM | POA: Diagnosis not present

## 2015-09-15 DIAGNOSIS — C9110 Chronic lymphocytic leukemia of B-cell type not having achieved remission: Secondary | ICD-10-CM | POA: Diagnosis not present

## 2015-09-15 DIAGNOSIS — E1122 Type 2 diabetes mellitus with diabetic chronic kidney disease: Secondary | ICD-10-CM | POA: Diagnosis not present

## 2015-09-15 DIAGNOSIS — I10 Essential (primary) hypertension: Secondary | ICD-10-CM | POA: Diagnosis not present

## 2015-09-15 DIAGNOSIS — Z23 Encounter for immunization: Secondary | ICD-10-CM | POA: Diagnosis not present

## 2015-11-29 DIAGNOSIS — M79644 Pain in right finger(s): Secondary | ICD-10-CM | POA: Diagnosis not present

## 2015-11-29 DIAGNOSIS — Z6841 Body Mass Index (BMI) 40.0 and over, adult: Secondary | ICD-10-CM | POA: Diagnosis not present

## 2015-12-01 DIAGNOSIS — E119 Type 2 diabetes mellitus without complications: Secondary | ICD-10-CM | POA: Diagnosis not present

## 2015-12-05 ENCOUNTER — Encounter: Payer: Self-pay | Admitting: Hematology and Oncology

## 2015-12-06 ENCOUNTER — Inpatient Hospital Stay: Payer: Commercial Managed Care - HMO | Admitting: Hematology and Oncology

## 2015-12-06 ENCOUNTER — Inpatient Hospital Stay: Payer: Commercial Managed Care - HMO

## 2015-12-09 DIAGNOSIS — B029 Zoster without complications: Secondary | ICD-10-CM | POA: Diagnosis not present

## 2015-12-20 ENCOUNTER — Inpatient Hospital Stay: Payer: Commercial Managed Care - HMO | Attending: Hematology and Oncology

## 2015-12-20 DIAGNOSIS — Z79899 Other long term (current) drug therapy: Secondary | ICD-10-CM | POA: Insufficient documentation

## 2015-12-20 DIAGNOSIS — Z7982 Long term (current) use of aspirin: Secondary | ICD-10-CM | POA: Diagnosis not present

## 2015-12-20 DIAGNOSIS — I10 Essential (primary) hypertension: Secondary | ICD-10-CM | POA: Diagnosis not present

## 2015-12-20 DIAGNOSIS — M1990 Unspecified osteoarthritis, unspecified site: Secondary | ICD-10-CM | POA: Insufficient documentation

## 2015-12-20 DIAGNOSIS — C9110 Chronic lymphocytic leukemia of B-cell type not having achieved remission: Secondary | ICD-10-CM | POA: Diagnosis not present

## 2015-12-20 DIAGNOSIS — E119 Type 2 diabetes mellitus without complications: Secondary | ICD-10-CM | POA: Insufficient documentation

## 2015-12-20 DIAGNOSIS — D509 Iron deficiency anemia, unspecified: Secondary | ICD-10-CM | POA: Insufficient documentation

## 2015-12-20 DIAGNOSIS — E781 Pure hyperglyceridemia: Secondary | ICD-10-CM | POA: Diagnosis not present

## 2015-12-20 LAB — IRON AND TIBC
Iron: 77 ug/dL (ref 28–170)
Saturation Ratios: 22 % (ref 10.4–31.8)
TIBC: 352 ug/dL (ref 250–450)
UIBC: 275 ug/dL

## 2015-12-20 LAB — CBC WITH DIFFERENTIAL/PLATELET
Basophils Absolute: 0.1 10*3/uL (ref 0–0.1)
Basophils Relative: 0 %
Eosinophils Absolute: 0.1 10*3/uL (ref 0–0.7)
Eosinophils Relative: 0 %
HCT: 33.8 % — ABNORMAL LOW (ref 35.0–47.0)
Hemoglobin: 11.3 g/dL — ABNORMAL LOW (ref 12.0–16.0)
Lymphocytes Relative: 83 %
Lymphs Abs: 17 10*3/uL — ABNORMAL HIGH (ref 1.0–3.6)
MCH: 30.8 pg (ref 26.0–34.0)
MCHC: 33.3 g/dL (ref 32.0–36.0)
MCV: 92.5 fL (ref 80.0–100.0)
Monocytes Absolute: 0.5 10*3/uL (ref 0.2–0.9)
Monocytes Relative: 2 %
Neutro Abs: 3.2 10*3/uL (ref 1.4–6.5)
Neutrophils Relative %: 15 %
Platelets: 213 10*3/uL (ref 150–440)
RBC: 3.66 MIL/uL — ABNORMAL LOW (ref 3.80–5.20)
RDW: 15.2 % — ABNORMAL HIGH (ref 11.5–14.5)
WBC: 20.8 10*3/uL — ABNORMAL HIGH (ref 3.6–11.0)

## 2015-12-20 LAB — BASIC METABOLIC PANEL
Anion gap: 7 (ref 5–15)
BUN: 27 mg/dL — ABNORMAL HIGH (ref 6–20)
CO2: 29 mmol/L (ref 22–32)
Calcium: 8.9 mg/dL (ref 8.9–10.3)
Chloride: 100 mmol/L — ABNORMAL LOW (ref 101–111)
Creatinine, Ser: 1.33 mg/dL — ABNORMAL HIGH (ref 0.44–1.00)
GFR calc Af Amer: 46 mL/min — ABNORMAL LOW (ref >60)
GFR calc non Af Amer: 40 mL/min — ABNORMAL LOW (ref >60)
Glucose, Bld: 137 mg/dL — ABNORMAL HIGH (ref 65–99)
Potassium: 4.1 mmol/L (ref 3.5–5.1)
Sodium: 136 mmol/L (ref 135–145)

## 2015-12-20 LAB — FERRITIN: Ferritin: 44 ng/mL (ref 11–307)

## 2015-12-21 ENCOUNTER — Inpatient Hospital Stay (HOSPITAL_BASED_OUTPATIENT_CLINIC_OR_DEPARTMENT_OTHER): Payer: Commercial Managed Care - HMO | Admitting: Hematology and Oncology

## 2015-12-21 ENCOUNTER — Inpatient Hospital Stay: Payer: Commercial Managed Care - HMO

## 2015-12-21 VITALS — Ht 69.0 in | Wt 269.4 lb

## 2015-12-21 DIAGNOSIS — I10 Essential (primary) hypertension: Secondary | ICD-10-CM | POA: Diagnosis not present

## 2015-12-21 DIAGNOSIS — D509 Iron deficiency anemia, unspecified: Secondary | ICD-10-CM | POA: Diagnosis not present

## 2015-12-21 DIAGNOSIS — Z7982 Long term (current) use of aspirin: Secondary | ICD-10-CM

## 2015-12-21 DIAGNOSIS — M1990 Unspecified osteoarthritis, unspecified site: Secondary | ICD-10-CM | POA: Diagnosis not present

## 2015-12-21 DIAGNOSIS — E119 Type 2 diabetes mellitus without complications: Secondary | ICD-10-CM

## 2015-12-21 DIAGNOSIS — E781 Pure hyperglyceridemia: Secondary | ICD-10-CM | POA: Diagnosis not present

## 2015-12-21 DIAGNOSIS — C9110 Chronic lymphocytic leukemia of B-cell type not having achieved remission: Secondary | ICD-10-CM

## 2015-12-21 DIAGNOSIS — Z79899 Other long term (current) drug therapy: Secondary | ICD-10-CM

## 2015-12-21 NOTE — Progress Notes (Addendum)
[facility] day:  12/21/2015  Chief Complaint: Tammy [name] is a 70 y.o. female with chronic lymphocytic leukemia (CLL) who is seen for 6 month assessment.  HPI:  The patient was last seen in the medical oncology clinic on 06/03/2015.  At that time, she was seen for initial assessment by me.  She denied any B symptoms. She had no infections. She denied any bruising or bleeding.  Exam revealed no adenopathy or hepatosplenomegaly.  Labs included a hematocrit of 35.3, hemoglobin 11.2, MCV 94, platelets 225,000, WBC 17,200 with an ANC of 2900.  Absolute lymphocyte count was 13,600.  LDH was 206.  Uric acid was 7.4.  Creatinine was 1.52 with a CrCl of 34 ml/min. Guaiac cards x 3 were negative (0/17-07/19/2016).  She continued on observation.  During the interim, she describes getting shingles about 2 weeks ago.  She was on Famvir for 7 days.  She has lost weight (trying).  She notes her diet consists of baked pork chops and chicken.  She rarely eats fried food.  She will eat salmon patties.  She eats meat 4 times a week.  She takes an iron pill a day.  Colonoscopy is scheduled for next year.  She did not have a mammogram last year.  Past Medical History  Diagnosis Date  . Unspecified deficiency anemia   . Other elevated white blood cell count   . Hypertension   . Diabetes mellitus   . Hyperlipidemia   . Hypertriglyceridemia   . DA (degenerative arthritis)   . Allergic rhinitis   . Heart murmur     Past Surgical History  Procedure Laterality Date  . Colonoscopy  2008    most recent  . Replacement total knee Right   . Replacement total knee Left     Family History  Problem Relation Age of Onset  . Cancer Father   . Cancer Sister     gastric cancer  . Cancer Father     gastric cancer    Social History:  reports that she has never smoked. She has never used smokeless tobacco. She reports that she does not drink alcohol or use illicit  drugs.  The patient is alone today.  Allergies:  Allergies  Allergen Reactions  . Sulfa Antibiotics   . Latex Rash    Current Medications: Current Outpatient Prescriptions  Medication Sig Dispense Refill  . ACCU-CHEK FASTCLIX LANCETS MISC     . ACCU-CHEK SMARTVIEW test strip     . aspirin 81 MG tablet Take 81 mg by mouth daily.    . benazepril-hydrochlorthiazide (LOTENSIN HCT) 20-12.5 MG per tablet Take 1 tablet by mouth daily.    . Blood Glucose Calibration (ACCU-CHEK SMARTVIEW CONTROL) LIQD     . cholecalciferol (VITAMIN D) 400 UNITS TABS Take 400 Units by mouth daily.    . famciclovir (FAMVIR) 500 MG tablet TAKE 1 TABLET BY MOUTH TWICE A DAY FOR 7 DAYS  0  . ferrous sulfate 325 (65 FE) MG tablet Take 325 mg by mouth daily with breakfast.    . glimepiride (AMARYL) 4 MG tablet Take 4 mg by mouth daily before breakfast.    . Multiple Vitamins-Minerals (MULTIVITAMIN PO) Take by mouth.    . pioglitazone (ACTOS) 45 MG tablet Take 45 mg by mouth daily.    . sitaGLIPtin (JANUVIA) 50 MG tablet Take 50 mg by mouth daily.    . verapamil (CALAN-SR) 180 MG CR tablet Take 180 mg  by mouth at bedtime.     No current facility-administered medications for this visit.    Review of Systems:  GENERAL:  Feels good.  No fevers or sweats.  Trying to lose weight. PERFORMANCE STATUS (ECOG):  2 HEENT:  No visual changes, runny nose, sore throat, mouth sores or tenderness. Lungs: No shortness of breath or cough.  No hemoptysis. Cardiac:  No chest pain, palpitations, orthopnea, or PND. GI:  No nausea, vomiting, diarrhea, constipation, melena or hematochezia. GU:  No urgency, frequency, dysuria, or hematuria. Musculoskeletal:  No back pain.  No joint pain.  No muscle tenderness. Extremities:  No pain or swelling. Skin:  Interval shingles (left side thoracic dermatome).  No rashes or skin changes. Neuro:  No headache, numbness or weakness, balance or coordination issues. Endocrine:  Diabetes.  No  thyroid issues, hot flashes or night sweats. Psych:  No mood changes, depression or anxiety. Pain:  No focal pain. Review of systems:  All other systems reviewed and found to be negative.   Physical Exam:  Blood pressure 143/72, pulse 81, temperature 95.8 F (35.4 C), temperature source Tympanic, Height 5'9", weight 278 pounds 3.5 ounces (126.2 kg) GENERAL:  Well developed, well nourished, heavyset woman sitting comfortably in the exam room in no acute distress. MENTAL STATUS:  Alert and oriented to person, place and time. HEAD:  Brown hair with red rinse in a bun.  Normocephalic, atraumatic, face symmetric, no Cushingoid features. EYES:  Brown eyes.  Pupils equal round and reactive to light and accomodation.  No conjunctivitis or scleral icterus. ENT:  Oropharynx clear without lesion.  Tongue normal. Mucous membranes moist.  RESPIRATORY:  Clear to auscultation without rales, wheezes or rhonchi. CARDIOVASCULAR:  Regular rate and rhythm without murmur, rub or gallop. ABDOMEN:  Soft, non-tender, with active bowel sounds, and no appreciable hepatosplenomegaly.  No masses. SKIN:  No rashes, ulcers or lesions. EXTREMITIES: No edema, no skin discoloration or tenderness.  No palpable cords. LYMPH NODES: No palpable cervical, supraclavicular, axillary or inguinal adenopathy  NEUROLOGICAL: Unremarkable. PSYCH:  Appropriate.   Appointment on 12/20/2015  Component Date Value Ref Range Status  . WBC 12/20/2015 20.8* 3.6 - 11.0 K/uL Final  . RBC 12/20/2015 3.66* 3.80 - 5.20 MIL/uL Final  . Hemoglobin 12/20/2015 11.3* 12.0 - 16.0 g/dL Final  . HCT 12/20/2015 33.8* 35.0 - 47.0 % Final  . MCV 12/20/2015 92.5  80.0 - 100.0 fL Final  . MCH 12/20/2015 30.8  26.0 - 34.0 pg Final  . MCHC 12/20/2015 33.3  32.0 - 36.0 g/dL Final  . RDW 12/20/2015 15.2* 11.5 - 14.5 % Final  . Platelets 12/20/2015 213  150 - 440 K/uL Final  . Neutrophils Relative % 12/20/2015 15   Final  . Neutro Abs 12/20/2015 3.2  1.4 - 6.5  K/uL Final  . Lymphocytes Relative 12/20/2015 83   Final  . Lymphs Abs 12/20/2015 17.0* 1.0 - 3.6 K/uL Final  . Monocytes Relative 12/20/2015 2   Final  . Monocytes Absolute 12/20/2015 0.5  0.2 - 0.9 K/uL Final  . Eosinophils Relative 12/20/2015 0   Final  . Eosinophils Absolute 12/20/2015 0.1  0 - 0.7 K/uL Final  . Basophils Relative 12/20/2015 0   Final  . Basophils Absolute 12/20/2015 0.1  0 - 0.1 K/uL Final  . Sodium 12/20/2015 136  135 - 145 mmol/L Final  . Potassium 12/20/2015 4.1  3.5 - 5.1 mmol/L Final  . Chloride 12/20/2015 100* 101 - 111 mmol/L Final  . CO2 12/20/2015  29  22 - 32 mmol/L Final  . Glucose, Bld 12/20/2015 137* 65 - 99 mg/dL Final  . BUN 12/20/2015 27* 6 - 20 mg/dL Final  . Creatinine, Ser 12/20/2015 1.33* 0.44 - 1.00 mg/dL Final  . Calcium 12/20/2015 8.9  8.9 - 10.3 mg/dL Final  . GFR calc non Af Amer 12/20/2015 40* >60 mL/min Final  . GFR calc Af Amer 12/20/2015 46* >60 mL/min Final   Comment: (NOTE) The eGFR has been calculated using the CKD EPI equation. This calculation has not been validated in all clinical situations. eGFR's persistently <60 mL/min signify possible Chronic Kidney Disease.   . Anion gap 12/20/2015 7  5 - 15 Final  . Ferritin 12/20/2015 44  11 - 307 ng/mL Final  . Iron 12/20/2015 77  28 - 170 ug/dL Final  . TIBC 12/20/2015 352  250 - 450 ug/dL Final  . Saturation Ratios 12/20/2015 22  10.4 - 31.8 % Final  . UIBC 12/20/2015 275   Final    Assessment:  Tammy [name] is a 70 y.o. female with stage 0 CLL diagnosed in 07/2009.  She presented with lymphocytosis.  WBC has ranged between 13,600 -. 32,500 without a trend.  Platelet count is normal.  She has had no issues with bruising or bleeding.    She has a history of iron deficiency anemia.  Diet is good (she eats meat 4 times week).  She is on oral iron (ferrous sulfate).  She denies any bleeding.  She states her colonoscopy is due in 2018.  Guaiac cards were - x 3 in 05/2015.  She has  chronic kidney disease (CrCl 46 ml/min).  She recently had shingles and completed a course of valacyclovir.  She has not had a mammogram in > 1 year.  Symptomatically, she denies any B symptoms.  Exam reveals no adenopathy or hepatosplenomegaly.  Plan: 1. Labs today:  CBC with diff, BMP, ferritin, iron studies. 2. Take iron with OJ or vitamin C.  Ferritin goal 100. 3. Encourage patient to have annual mammogram. 4. RTC in 6 months for MD assessment and labs (CBC with diff, BMP, ferritin, retic, B12, folate).   Lequita Asal, MD  12/21/2015, 3:16 PM

## 2015-12-28 ENCOUNTER — Encounter: Payer: Self-pay | Admitting: Hematology and Oncology

## 2016-02-03 DIAGNOSIS — H00019 Hordeolum externum unspecified eye, unspecified eyelid: Secondary | ICD-10-CM | POA: Diagnosis not present

## 2016-03-02 DIAGNOSIS — M5416 Radiculopathy, lumbar region: Secondary | ICD-10-CM | POA: Diagnosis not present

## 2016-03-02 DIAGNOSIS — Z96653 Presence of artificial knee joint, bilateral: Secondary | ICD-10-CM | POA: Diagnosis not present

## 2016-03-15 DIAGNOSIS — M1990 Unspecified osteoarthritis, unspecified site: Secondary | ICD-10-CM | POA: Diagnosis not present

## 2016-03-15 DIAGNOSIS — E782 Mixed hyperlipidemia: Secondary | ICD-10-CM | POA: Diagnosis not present

## 2016-03-15 DIAGNOSIS — I10 Essential (primary) hypertension: Secondary | ICD-10-CM | POA: Diagnosis not present

## 2016-03-15 DIAGNOSIS — D638 Anemia in other chronic diseases classified elsewhere: Secondary | ICD-10-CM | POA: Diagnosis not present

## 2016-03-15 DIAGNOSIS — Z0000 Encounter for general adult medical examination without abnormal findings: Secondary | ICD-10-CM | POA: Diagnosis not present

## 2016-03-15 DIAGNOSIS — E1122 Type 2 diabetes mellitus with diabetic chronic kidney disease: Secondary | ICD-10-CM | POA: Diagnosis not present

## 2016-03-15 DIAGNOSIS — C9110 Chronic lymphocytic leukemia of B-cell type not having achieved remission: Secondary | ICD-10-CM | POA: Diagnosis not present

## 2016-03-15 DIAGNOSIS — N183 Chronic kidney disease, stage 3 (moderate): Secondary | ICD-10-CM | POA: Diagnosis not present

## 2016-06-07 DIAGNOSIS — N183 Chronic kidney disease, stage 3 (moderate): Secondary | ICD-10-CM | POA: Diagnosis not present

## 2016-06-07 DIAGNOSIS — E1122 Type 2 diabetes mellitus with diabetic chronic kidney disease: Secondary | ICD-10-CM | POA: Diagnosis not present

## 2016-06-07 DIAGNOSIS — C9110 Chronic lymphocytic leukemia of B-cell type not having achieved remission: Secondary | ICD-10-CM | POA: Diagnosis not present

## 2016-06-07 DIAGNOSIS — I10 Essential (primary) hypertension: Secondary | ICD-10-CM | POA: Diagnosis not present

## 2016-06-07 DIAGNOSIS — Z6841 Body Mass Index (BMI) 40.0 and over, adult: Secondary | ICD-10-CM | POA: Diagnosis not present

## 2016-06-07 DIAGNOSIS — Z7984 Long term (current) use of oral hypoglycemic drugs: Secondary | ICD-10-CM | POA: Diagnosis not present

## 2016-06-07 DIAGNOSIS — E782 Mixed hyperlipidemia: Secondary | ICD-10-CM | POA: Diagnosis not present

## 2016-06-14 ENCOUNTER — Other Ambulatory Visit: Payer: Self-pay | Admitting: *Deleted

## 2016-06-14 ENCOUNTER — Other Ambulatory Visit: Payer: Commercial Managed Care - HMO

## 2016-06-14 ENCOUNTER — Encounter: Payer: Self-pay | Admitting: Hematology and Oncology

## 2016-06-14 ENCOUNTER — Inpatient Hospital Stay: Payer: Commercial Managed Care - HMO | Attending: Hematology and Oncology | Admitting: Hematology and Oncology

## 2016-06-14 ENCOUNTER — Inpatient Hospital Stay: Payer: Commercial Managed Care - HMO

## 2016-06-14 VITALS — BP 127/70 | HR 73 | Temp 97.7°F | Resp 18 | Wt 268.9 lb

## 2016-06-14 DIAGNOSIS — R011 Cardiac murmur, unspecified: Secondary | ICD-10-CM | POA: Diagnosis not present

## 2016-06-14 DIAGNOSIS — C9110 Chronic lymphocytic leukemia of B-cell type not having achieved remission: Secondary | ICD-10-CM | POA: Diagnosis not present

## 2016-06-14 DIAGNOSIS — Z809 Family history of malignant neoplasm, unspecified: Secondary | ICD-10-CM | POA: Diagnosis not present

## 2016-06-14 DIAGNOSIS — D509 Iron deficiency anemia, unspecified: Secondary | ICD-10-CM | POA: Diagnosis not present

## 2016-06-14 DIAGNOSIS — Z800 Family history of malignant neoplasm of digestive organs: Secondary | ICD-10-CM | POA: Insufficient documentation

## 2016-06-14 DIAGNOSIS — I129 Hypertensive chronic kidney disease with stage 1 through stage 4 chronic kidney disease, or unspecified chronic kidney disease: Secondary | ICD-10-CM | POA: Diagnosis not present

## 2016-06-14 DIAGNOSIS — N189 Chronic kidney disease, unspecified: Secondary | ICD-10-CM | POA: Diagnosis not present

## 2016-06-14 DIAGNOSIS — Z79899 Other long term (current) drug therapy: Secondary | ICD-10-CM | POA: Diagnosis not present

## 2016-06-14 DIAGNOSIS — D72829 Elevated white blood cell count, unspecified: Secondary | ICD-10-CM | POA: Diagnosis not present

## 2016-06-14 DIAGNOSIS — E1122 Type 2 diabetes mellitus with diabetic chronic kidney disease: Secondary | ICD-10-CM | POA: Diagnosis not present

## 2016-06-14 DIAGNOSIS — J309 Allergic rhinitis, unspecified: Secondary | ICD-10-CM | POA: Diagnosis not present

## 2016-06-14 DIAGNOSIS — Z7982 Long term (current) use of aspirin: Secondary | ICD-10-CM | POA: Insufficient documentation

## 2016-06-14 DIAGNOSIS — E781 Pure hyperglyceridemia: Secondary | ICD-10-CM | POA: Insufficient documentation

## 2016-06-14 LAB — CBC WITH DIFFERENTIAL/PLATELET
Basophils Absolute: 0.1 10*3/uL (ref 0–0.1)
Basophils Relative: 0 %
Eosinophils Absolute: 0.1 10*3/uL (ref 0–0.7)
Eosinophils Relative: 1 %
HCT: 34.8 % — ABNORMAL LOW (ref 35.0–47.0)
Hemoglobin: 11.3 g/dL — ABNORMAL LOW (ref 12.0–16.0)
Lymphocytes Relative: 79 %
Lymphs Abs: 20.3 10*3/uL — ABNORMAL HIGH (ref 1.0–3.6)
MCH: 30.6 pg (ref 26.0–34.0)
MCHC: 32.5 g/dL (ref 32.0–36.0)
MCV: 94.1 fL (ref 80.0–100.0)
Monocytes Absolute: 0.6 10*3/uL (ref 0.2–0.9)
Monocytes Relative: 2 %
Neutro Abs: 4.5 10*3/uL (ref 1.4–6.5)
Neutrophils Relative %: 18 %
Platelets: 223 10*3/uL (ref 150–440)
RBC: 3.7 MIL/uL — ABNORMAL LOW (ref 3.80–5.20)
RDW: 15.1 % — ABNORMAL HIGH (ref 11.5–14.5)
WBC: 25.6 10*3/uL — ABNORMAL HIGH (ref 3.6–11.0)

## 2016-06-14 LAB — BASIC METABOLIC PANEL
Anion gap: 8 (ref 5–15)
BUN: 26 mg/dL — ABNORMAL HIGH (ref 6–20)
CO2: 28 mmol/L (ref 22–32)
Calcium: 8.8 mg/dL — ABNORMAL LOW (ref 8.9–10.3)
Chloride: 103 mmol/L (ref 101–111)
Creatinine, Ser: 1.3 mg/dL — ABNORMAL HIGH (ref 0.44–1.00)
GFR calc Af Amer: 47 mL/min — ABNORMAL LOW (ref >60)
GFR calc non Af Amer: 41 mL/min — ABNORMAL LOW (ref >60)
Glucose, Bld: 139 mg/dL — ABNORMAL HIGH (ref 65–99)
Potassium: 3.9 mmol/L (ref 3.5–5.1)
Sodium: 139 mmol/L (ref 135–145)

## 2016-06-14 LAB — FERRITIN: Ferritin: 42 ng/mL (ref 11–307)

## 2016-06-14 LAB — VITAMIN B12: Vitamin B-12: 1463 pg/mL — ABNORMAL HIGH (ref 180–914)

## 2016-06-14 LAB — RETICULOCYTES
RBC.: 3.7 MIL/uL — ABNORMAL LOW (ref 3.80–5.20)
Retic Count, Absolute: 51.8 10*3/uL (ref 19.0–183.0)
Retic Ct Pct: 1.4 % (ref 0.4–3.1)

## 2016-06-14 LAB — FOLATE: Folate: 46 ng/mL (ref >5.9)

## 2016-06-14 NOTE — Progress Notes (Signed)
Patient is here today for follow up, she is doing very well no complaints today.

## 2016-06-14 NOTE — Progress Notes (Signed)
[facility] day:  06/14/16  Chief Complaint: Tammy [name] is a 70 y.o. female with chronic lymphocytic leukemia (CLL) and a history of iron deficiency anemia who is seen for 6 month assessment.  HPI:  The patient was last seen in the medical oncology clinic on 12/21/2015.  At that time, she denied any B symptoms.  Exam revealed no adenopathy or hepatosplenomegaly.  Labs included a hematocrit of 33.8, hemoglobin 11.3, MCV 92.5, platelets 213,000, WBC 20,800.  Creatinine was 1.33.  Ferritin was 44.  Iron studies included a saturation of 22% and TIBC of 352.  Mammogram on 09/13/2015 revealed no evidence of malignancy.  She is due for her colonoscopy in [place] in 2018.  During the interim, she has done well.  She denies any interval infections.  She denies any B symptoms.  She lost 15 pounds on a weight loss medication which curbed her appetite.  She is off that medication now.  She is trying to keep her weight off secondary to her knees.  She denies any adenopathy, bruising or bleeding.   Past Medical History:  Diagnosis Date  . Allergic rhinitis   . DA (degenerative arthritis)   . Diabetes mellitus   . Heart murmur   . Hyperlipidemia   . Hypertension   . Hypertriglyceridemia   . Other elevated white blood cell count   . Unspecified deficiency anemia     Past Surgical History:  Procedure Laterality Date  . COLONOSCOPY  2008   most recent  . REPLACEMENT TOTAL KNEE Right   . REPLACEMENT TOTAL KNEE Left     Family History  Problem Relation Age of Onset  . Cancer Father   . Cancer Sister     gastric cancer  . Cancer Father     gastric cancer    Social History:  reports that she has never smoked. She has never used smokeless tobacco. She reports that she does not drink alcohol or use drugs.  The patient is alone today.  Allergies:  Allergies  Allergen Reactions  . Sulfa Antibiotics   . Latex Rash    Current  Medications: Current Outpatient Prescriptions  Medication Sig Dispense Refill  . ACCU-CHEK FASTCLIX LANCETS MISC     . ACCU-CHEK SMARTVIEW test strip     . aspirin 81 MG tablet Take 81 mg by mouth daily.    . benazepril-hydrochlorthiazide (LOTENSIN HCT) 20-12.5 MG per tablet Take 1 tablet by mouth daily.    . Blood Glucose Calibration (ACCU-CHEK SMARTVIEW CONTROL) LIQD     . cholecalciferol (VITAMIN D) 400 UNITS TABS Take 400 Units by mouth daily.    . ferrous sulfate 325 (65 FE) MG tablet Take 325 mg by mouth daily with breakfast.    . glimepiride (AMARYL) 4 MG tablet Take 4 mg by mouth daily before breakfast.    . Multiple Vitamins-Minerals (MULTIVITAMIN PO) Take by mouth.    . pioglitazone (ACTOS) 45 MG tablet Take 45 mg by mouth daily.    . sitaGLIPtin (JANUVIA) 50 MG tablet Take 50 mg by mouth daily.    . verapamil (CALAN-SR) 180 MG CR tablet Take 180 mg by mouth at bedtime.    . famciclovir (FAMVIR) 500 MG tablet TAKE 1 TABLET BY MOUTH TWICE A DAY FOR 7 DAYS  0   No current facility-administered medications for this visit.     Review of Systems:  GENERAL:  Feels good.  No fevers or sweats.  Trying to  lose weight. PERFORMANCE STATUS (ECOG):  1 HEENT:  No visual changes, runny nose, sore throat, mouth sores or tenderness. Lungs: No shortness of breath or cough.  No hemoptysis. Cardiac:  No chest pain, palpitations, orthopnea, or PND. GI:  No nausea, vomiting, diarrhea, constipation, melena or hematochezia. GU:  No urgency, frequency, dysuria, or hematuria. Musculoskeletal:  No back pain.  Knee issues s/p bilateral replacement.  No muscle tenderness. Extremities:  No pain or swelling. Skin:  No rashes or skin changes. Neuro:  No headache, numbness or weakness, balance or coordination issues. Endocrine:  Diabetes.  No thyroid issues, hot flashes or night sweats. Psych:  No mood changes, depression or anxiety. Pain:  No focal pain. Review of systems:  All other systems reviewed  and found to be negative.   Physical Exam:  Blood pressure 143/72, pulse 81, temperature 95.8 F (35.4 C), temperature source Tympanic, Height 5'9", weight 278 pounds 3.5 ounces (126.2 kg) GENERAL:  Well developed, well nourished, heavyset woman sitting comfortably in the exam room in no acute distress. MENTAL STATUS:  Alert and oriented to person, place and time. HEAD:  Brown hair pulled back.  Normocephalic, atraumatic, face symmetric, no Cushingoid features. EYES:  Brown eyes.  Pupils equal round and reactive to light and accomodation.  No conjunctivitis or scleral icterus. ENT:  Oropharynx clear without lesion.  Tongue normal. Mucous membranes moist.  RESPIRATORY:  Clear to auscultation without rales, wheezes or rhonchi. CARDIOVASCULAR:  Regular rate and rhythm without murmur, rub or gallop. ABDOMEN:  Soft, non-tender, with active bowel sounds, and no appreciable hepatosplenomegaly.  No masses. SKIN:  No rashes, ulcers or lesions. EXTREMITIES: No edema, no skin discoloration or tenderness.  No palpable cords. LYMPH NODES: No palpable cervical, supraclavicular, axillary or inguinal adenopathy  NEUROLOGICAL: Unremarkable. PSYCH:  Appropriate.    Appointment on 06/14/2016  Component Date Value Ref Range Status  . WBC 06/14/2016 25.6* 3.6 - 11.0 K/uL Final  . RBC 06/14/2016 3.70* 3.80 - 5.20 MIL/uL Final  . Hemoglobin 06/14/2016 11.3* 12.0 - 16.0 g/dL Final  . HCT 06/14/2016 34.8* 35.0 - 47.0 % Final  . MCV 06/14/2016 94.1  80.0 - 100.0 fL Final  . MCH 06/14/2016 30.6  26.0 - 34.0 pg Final  . MCHC 06/14/2016 32.5  32.0 - 36.0 g/dL Final  . RDW 06/14/2016 15.1* 11.5 - 14.5 % Final  . Platelets 06/14/2016 223  150 - 440 K/uL Final  . Neutrophils Relative % 06/14/2016 18%  % Final  . Neutro Abs 06/14/2016 4.5  1.4 - 6.5 K/uL Final  . Lymphocytes Relative 06/14/2016 79%  % Final  . Lymphs Abs 06/14/2016 20.3* 1.0 - 3.6 K/uL Final  . Monocytes Relative 06/14/2016 2%  % Final  . Monocytes  Absolute 06/14/2016 0.6  0.2 - 0.9 K/uL Final  . Eosinophils Relative 06/14/2016 1%  % Final  . Eosinophils Absolute 06/14/2016 0.1  0 - 0.7 K/uL Final  . Basophils Relative 06/14/2016 0%  % Final  . Basophils Absolute 06/14/2016 0.1  0 - 0.1 K/uL Final  . Retic Ct Pct 06/14/2016 PENDING  0.4 - 3.1 % Incomplete  . RBC. 06/14/2016 PENDING  3.87 - 5.11 MIL/uL Incomplete  . Retic Count, Manual 06/14/2016 PENDING  19.0 - 186.0 K/uL Incomplete  . Sodium 06/14/2016 139  135 - 145 mmol/L Final  . Potassium 06/14/2016 3.9  3.5 - 5.1 mmol/L Final  . Chloride 06/14/2016 103  101 - 111 mmol/L Final  . CO2 06/14/2016 28  22 -  32 mmol/L Final  . Glucose, Bld 06/14/2016 139* 65 - 99 mg/dL Final  . BUN 06/14/2016 26* 6 - 20 mg/dL Final  . Creatinine, Ser 06/14/2016 1.30* 0.44 - 1.00 mg/dL Final  . Calcium 06/14/2016 8.8* 8.9 - 10.3 mg/dL Final  . GFR calc non Af Amer 06/14/2016 41* >60 mL/min Final  . GFR calc Af Amer 06/14/2016 47* >60 mL/min Final   Comment: (NOTE) The eGFR has been calculated using the CKD EPI equation. This calculation has not been validated in all clinical situations. eGFR's persistently <60 mL/min signify possible Chronic Kidney Disease.   . Anion gap 06/14/2016 8  5 - 15 Final    Assessment:  ARLET MARTER is a 70 y.o. female with stage 0 CLL diagnosed in 07/2009.  She presented with lymphocytosis.  WBC has ranged between 13,600 -. 32,500 without a trend.  Platelet count is normal.  She has had no issues with bruising or bleeding.    She has a history of iron deficiency anemia.  Diet is good (she eats meat 4 times week).  She is on oral iron (ferrous sulfate) every other day.  She denies any bleeding. Colonoscopy is due in 2018.  Guaiac cards were - x 3 in 05/2015.  She has chronic kidney disease (CrCl 46 ml/min).  Mammogram on 09/13/2015 was negative.  Symptomatically, she denies any B symptoms.  Exam reveals no adenopathy or hepatosplenomegaly.  Plan: 1. Labs today:   CBC with diff, BMP, ferritin, retic, B12, folate. 2. Continue iron with OJ or vitamin C.  Ferritin goal 100. 3. Call patient with any abnormal lab results. 4. RTC in 6 months for MD assessment and labs (CBC with diff, ferritin, iron studies).   Lequita Asal, MD  06/14/2016, 2:10 PM

## 2016-06-16 ENCOUNTER — Ambulatory Visit: Payer: Commercial Managed Care - HMO | Admitting: Hematology and Oncology

## 2016-06-16 ENCOUNTER — Other Ambulatory Visit: Payer: Commercial Managed Care - HMO

## 2016-08-23 ENCOUNTER — Other Ambulatory Visit: Payer: Self-pay | Admitting: Internal Medicine

## 2016-08-23 DIAGNOSIS — Z1231 Encounter for screening mammogram for malignant neoplasm of breast: Secondary | ICD-10-CM

## 2016-09-15 ENCOUNTER — Ambulatory Visit: Payer: Commercial Managed Care - HMO

## 2016-10-04 ENCOUNTER — Ambulatory Visit
Admission: RE | Admit: 2016-10-04 | Discharge: 2016-10-04 | Disposition: A | Discharge status: Home / Self Care | Payer: Commercial Managed Care - HMO | Source: Ambulatory Visit | Attending: Internal Medicine | Admitting: Internal Medicine

## 2016-10-04 DIAGNOSIS — Z1231 Encounter for screening mammogram for malignant neoplasm of breast: Secondary | ICD-10-CM | POA: Diagnosis not present

## 2016-10-18 DIAGNOSIS — Z7984 Long term (current) use of oral hypoglycemic drugs: Secondary | ICD-10-CM | POA: Diagnosis not present

## 2016-10-18 DIAGNOSIS — E1122 Type 2 diabetes mellitus with diabetic chronic kidney disease: Secondary | ICD-10-CM | POA: Diagnosis not present

## 2016-10-18 DIAGNOSIS — I10 Essential (primary) hypertension: Secondary | ICD-10-CM | POA: Diagnosis not present

## 2016-10-18 DIAGNOSIS — C9110 Chronic lymphocytic leukemia of B-cell type not having achieved remission: Secondary | ICD-10-CM | POA: Diagnosis not present

## 2016-10-18 DIAGNOSIS — N183 Chronic kidney disease, stage 3 (moderate): Secondary | ICD-10-CM | POA: Diagnosis not present

## 2016-12-13 ENCOUNTER — Encounter: Payer: Self-pay | Admitting: Hematology and Oncology

## 2016-12-13 ENCOUNTER — Other Ambulatory Visit: Payer: Self-pay | Admitting: *Deleted

## 2016-12-13 ENCOUNTER — Inpatient Hospital Stay (HOSPITAL_BASED_OUTPATIENT_CLINIC_OR_DEPARTMENT_OTHER): Payer: Commercial Managed Care - HMO | Admitting: Hematology and Oncology

## 2016-12-13 ENCOUNTER — Inpatient Hospital Stay: Payer: Commercial Managed Care - HMO | Attending: Hematology and Oncology

## 2016-12-13 VITALS — BP 136/63 | HR 75 | Temp 97.1°F | Resp 18 | Wt 272.5 lb

## 2016-12-13 DIAGNOSIS — E1122 Type 2 diabetes mellitus with diabetic chronic kidney disease: Secondary | ICD-10-CM | POA: Insufficient documentation

## 2016-12-13 DIAGNOSIS — N189 Chronic kidney disease, unspecified: Secondary | ICD-10-CM

## 2016-12-13 DIAGNOSIS — D509 Iron deficiency anemia, unspecified: Secondary | ICD-10-CM | POA: Insufficient documentation

## 2016-12-13 DIAGNOSIS — Z79899 Other long term (current) drug therapy: Secondary | ICD-10-CM | POA: Diagnosis not present

## 2016-12-13 DIAGNOSIS — Z7982 Long term (current) use of aspirin: Secondary | ICD-10-CM | POA: Diagnosis not present

## 2016-12-13 DIAGNOSIS — C9110 Chronic lymphocytic leukemia of B-cell type not having achieved remission: Secondary | ICD-10-CM

## 2016-12-13 DIAGNOSIS — Z800 Family history of malignant neoplasm of digestive organs: Secondary | ICD-10-CM | POA: Insufficient documentation

## 2016-12-13 DIAGNOSIS — E781 Pure hyperglyceridemia: Secondary | ICD-10-CM

## 2016-12-13 DIAGNOSIS — I129 Hypertensive chronic kidney disease with stage 1 through stage 4 chronic kidney disease, or unspecified chronic kidney disease: Secondary | ICD-10-CM

## 2016-12-13 LAB — CBC WITH DIFFERENTIAL/PLATELET
Basophils Absolute: 0.1 10*3/uL (ref 0–0.1)
Basophils Relative: 0 %
Eosinophils Absolute: 0.2 10*3/uL (ref 0–0.7)
Eosinophils Relative: 1 %
HCT: 35.8 % (ref 35.0–47.0)
Hemoglobin: 11.5 g/dL — ABNORMAL LOW (ref 12.0–16.0)
Lymphocytes Relative: 82 %
Lymphs Abs: 23.6 10*3/uL — ABNORMAL HIGH (ref 1.0–3.6)
MCH: 30.1 pg (ref 26.0–34.0)
MCHC: 32.2 g/dL (ref 32.0–36.0)
MCV: 93.4 fL (ref 80.0–100.0)
Monocytes Absolute: 0.7 10*3/uL (ref 0.2–0.9)
Monocytes Relative: 2 %
Neutro Abs: 4.2 10*3/uL (ref 1.4–6.5)
Neutrophils Relative %: 15 %
Platelets: 207 10*3/uL (ref 150–440)
RBC: 3.83 MIL/uL (ref 3.80–5.20)
RDW: 14.7 % — ABNORMAL HIGH (ref 11.5–14.5)
WBC: 28.7 10*3/uL — ABNORMAL HIGH (ref 3.6–11.0)

## 2016-12-13 LAB — IRON AND TIBC
Iron: 88 ug/dL (ref 28–170)
Saturation Ratios: 24 % (ref 10.4–31.8)
TIBC: 365 ug/dL (ref 250–450)
UIBC: 277 ug/dL

## 2016-12-13 LAB — FERRITIN: Ferritin: 49 ng/mL (ref 11–307)

## 2016-12-13 NOTE — Progress Notes (Signed)
[facility] day:  12/13/16  Chief Complaint: Tammy [name] is a 71 y.o. female with chronic lymphocytic leukemia (CLL) and a history of iron deficiency anemia who is seen for 6 month assessment.  HPI:  The patient was last seen in the medical oncology clinic on 06/14/2016.  At that time, she denied any B symptoms.  Exam revealed no adenopathy or hepatosplenomegaly.  Labs included a hematocrit of 34.8, hemoglobin 11.3, MCV 94.1, platelets 223,000, WBC 25,600.  Creatinine was 1.30.  Ferritin was 42.  Retic was 1.4%.  B12 was 1463.  Folate was 46.  Mammogram on 10/04/2016 revealed no evidence of malignancy.   During the interim, she has done well.  She denies any B symptoms, bruising or bleeding, adenopathy, or issues with infections.  She is taking 1 iron tablet a day with OJ or vitamin C.  She has a colonoscopy in [place] scheduled for this year.   Past Medical History:  Diagnosis Date  . Allergic rhinitis   . DA (degenerative arthritis)   . Diabetes mellitus   . Heart murmur   . Hyperlipidemia   . Hypertension   . Hypertriglyceridemia   . Other elevated white blood cell count   . Unspecified deficiency anemia     Past Surgical History:  Procedure Laterality Date  . COLONOSCOPY  2008   most recent  . REPLACEMENT TOTAL KNEE Right   . REPLACEMENT TOTAL KNEE Left     Family History  Problem Relation Age of Onset  . Cancer Father     gastric cancer  . Cancer Sister     gastric cancer    Social History:  reports that she has never smoked. She has never used smokeless tobacco. She reports that she does not drink alcohol or use drugs.  She lives in [place].  The patient is alone today.  Allergies:  Allergies  Allergen Reactions  . Sulfa Antibiotics   . Latex Rash    Current Medications: Current Outpatient Prescriptions  Medication Sig Dispense Refill  . ACCU-CHEK FASTCLIX LANCETS MISC     . ACCU-CHEK SMARTVIEW test  strip     . aspirin 81 MG tablet Take 81 mg by mouth daily.    . benazepril-hydrochlorthiazide (LOTENSIN HCT) 20-12.5 MG per tablet Take 1 tablet by mouth daily.    . Blood Glucose Calibration (ACCU-CHEK SMARTVIEW CONTROL) LIQD     . cholecalciferol (VITAMIN D) 400 UNITS TABS Take 400 Units by mouth daily.    . famciclovir (FAMVIR) 500 MG tablet TAKE 1 TABLET BY MOUTH TWICE A DAY FOR 7 DAYS  0  . ferrous sulfate 325 (65 FE) MG tablet Take 325 mg by mouth daily with breakfast.    . glimepiride (AMARYL) 4 MG tablet Take 4 mg by mouth daily before breakfast.    . Multiple Vitamins-Minerals (MULTIVITAMIN PO) Take by mouth.    . pioglitazone (ACTOS) 45 MG tablet Take 45 mg by mouth daily.    . sitaGLIPtin (JANUVIA) 50 MG tablet Take 50 mg by mouth daily.    . verapamil (CALAN-SR) 180 MG CR tablet Take 180 mg by mouth at bedtime.     No current facility-administered medications for this visit.     Review of Systems:  GENERAL:  Feels good.  No fevers or sweats.  Weight stable. PERFORMANCE STATUS (ECOG):  0 HEENT:  No visual changes, runny nose, sore throat, mouth sores or tenderness. Lungs: No shortness of breath or cough.  No hemoptysis. Cardiac:  No chest pain, palpitations, orthopnea, or PND. GI:  No nausea, vomiting, diarrhea, constipation, melena or hematochezia. GU:  No urgency, frequency, dysuria, or hematuria. Musculoskeletal:  No back pain.  Knee issues s/p bilateral replacement.  No muscle tenderness. Extremities:  No pain or swelling. Skin:  No rashes or skin changes. Neuro:  No headache, numbness or weakness, balance or coordination issues. Endocrine:  Diabetes.  No thyroid issues, hot flashes or night sweats. Psych:  No mood changes, depression or anxiety. Pain:  No focal pain. Review of systems:  All other systems reviewed and found to be negative.   Physical Exam:  Blood pressure 143/72, pulse 81, temperature 95.8 F (35.4 C), temperature source Tympanic, Height 5'9", weight  278 pounds 3.5 ounces (126.2 kg) GENERAL:  Well developed, well nourished, heavyset woman sitting comfortably in the exam room in no acute distress. MENTAL STATUS:  Alert and oriented to person, place and time. HEAD:  Brown hair pulled back.  Normocephalic, atraumatic, face symmetric, no Cushingoid features. EYES:  Brown eyes.  Pupils equal round and reactive to light and accomodation.  No conjunctivitis or scleral icterus. ENT:  Oropharynx clear without lesion.  Tongue normal. Mucous membranes moist.  RESPIRATORY:  Clear to auscultation without rales, wheezes or rhonchi. CARDIOVASCULAR:  Regular rate and rhythm without murmur, rub or gallop. ABDOMEN:  Soft, non-tender, with active bowel sounds, and no appreciable hepatosplenomegaly.  No masses. SKIN:  No rashes, ulcers or lesions. EXTREMITIES: No edema, no skin discoloration or tenderness.  No palpable cords. LYMPH NODES: No palpable cervical, supraclavicular, axillary or inguinal adenopathy  NEUROLOGICAL: Unremarkable. PSYCH:  Appropriate.    Appointment on 12/13/2016  Component Date Value Ref Range Status  . WBC 12/13/2016 28.7* 3.6 - 11.0 K/uL Final  . RBC 12/13/2016 3.83  3.80 - 5.20 MIL/uL Final  . Hemoglobin 12/13/2016 11.5* 12.0 - 16.0 g/dL Final  . HCT 12/13/2016 35.8  35.0 - 47.0 % Final  . MCV 12/13/2016 93.4  80.0 - 100.0 fL Final  . MCH 12/13/2016 30.1  26.0 - 34.0 pg Final  . MCHC 12/13/2016 32.2  32.0 - 36.0 g/dL Final  . RDW 12/13/2016 14.7* 11.5 - 14.5 % Final  . Platelets 12/13/2016 207  150 - 440 K/uL Final  . Neutrophils Relative % 12/13/2016 15  % Final  . Neutro Abs 12/13/2016 4.2  1.4 - 6.5 K/uL Final  . Lymphocytes Relative 12/13/2016 82  % Final  . Lymphs Abs 12/13/2016 23.6* 1.0 - 3.6 K/uL Final  . Monocytes Relative 12/13/2016 2  % Final  . Monocytes Absolute 12/13/2016 0.7  0.2 - 0.9 K/uL Final  . Eosinophils Relative 12/13/2016 1  % Final  . Eosinophils Absolute 12/13/2016 0.2  0 - 0.7 K/uL Final  .  Basophils Relative 12/13/2016 0  % Final  . Basophils Absolute 12/13/2016 0.1  0 - 0.1 K/uL Final    Assessment:  Tammy [name] is a 71 y.o. female with stage 0 CLL diagnosed in 07/2009.  She presented with lymphocytosis.  WBC has ranged between 13,600 -. 32,500 without a trend.  Platelet count is normal.  She has had no issues with bruising or bleeding.    She has a history of iron deficiency anemia.  Diet is good (she eats meat 4 times/week).  She is on oral iron (ferrous sulfate) every other day.  She denies any bleeding. Colonoscopy is due in 2018.  Guaiac cards were - x 3 in 05/2015.  She has  chronic kidney disease (CrCl 46 ml/min).  B12 and folate were normal on 06/14/2016.  Mammogram on 10/04/2016 was negative.  She is scheduled for colonoscopy in 2018.  Symptomatically, she denies any B symptoms.  She has had no issues with infections, bruising or bleeding.  Exam reveals no adenopathy or hepatosplenomegaly.  WBC is 28,700.  Plan: 1. Labs today:  CBC with diff, ferritin, iron studies. 2. Continue iron with OJ or vitamin C.  Ferritin goal 100. 3. Contact patient with results of ferritin. 4. Follow-up colonoscopy scheduled for this year. 5. RTC in 6 months for MD assessment and labs (CBC with diff, ferritin).   Lequita Asal, MD  12/13/2016, 9:26 AM

## 2016-12-13 NOTE — Progress Notes (Signed)
Patient offers no complaints today. 

## 2016-12-20 DIAGNOSIS — E119 Type 2 diabetes mellitus without complications: Secondary | ICD-10-CM | POA: Diagnosis not present

## 2017-01-02 ENCOUNTER — Other Ambulatory Visit: Payer: Self-pay | Admitting: Internal Medicine

## 2017-01-02 ENCOUNTER — Ambulatory Visit
Admission: RE | Admit: 2017-01-02 | Discharge: 2017-01-02 | Disposition: A | Discharge status: Home / Self Care | Payer: Medicare HMO | Source: Ambulatory Visit | Attending: Internal Medicine | Admitting: Internal Medicine

## 2017-01-02 DIAGNOSIS — M25569 Pain in unspecified knee: Secondary | ICD-10-CM

## 2017-01-02 DIAGNOSIS — M25562 Pain in left knee: Secondary | ICD-10-CM | POA: Diagnosis not present

## 2017-01-02 DIAGNOSIS — S8992XA Unspecified injury of left lower leg, initial encounter: Secondary | ICD-10-CM | POA: Diagnosis not present

## 2017-01-03 DIAGNOSIS — S86912A Strain of unspecified muscle(s) and tendon(s) at lower leg level, left leg, initial encounter: Secondary | ICD-10-CM | POA: Diagnosis not present

## 2017-01-03 DIAGNOSIS — M25562 Pain in left knee: Secondary | ICD-10-CM | POA: Diagnosis not present

## 2017-01-17 DIAGNOSIS — M25662 Stiffness of left knee, not elsewhere classified: Secondary | ICD-10-CM | POA: Diagnosis not present

## 2017-01-17 DIAGNOSIS — R29898 Other symptoms and signs involving the musculoskeletal system: Secondary | ICD-10-CM | POA: Diagnosis not present

## 2017-01-17 DIAGNOSIS — M25562 Pain in left knee: Secondary | ICD-10-CM | POA: Diagnosis not present

## 2017-01-25 DIAGNOSIS — M25562 Pain in left knee: Secondary | ICD-10-CM | POA: Diagnosis not present

## 2017-02-05 DIAGNOSIS — M25562 Pain in left knee: Secondary | ICD-10-CM | POA: Diagnosis not present

## 2017-03-19 DIAGNOSIS — N183 Chronic kidney disease, stage 3 (moderate): Secondary | ICD-10-CM | POA: Diagnosis not present

## 2017-03-19 DIAGNOSIS — E1122 Type 2 diabetes mellitus with diabetic chronic kidney disease: Secondary | ICD-10-CM | POA: Diagnosis not present

## 2017-03-19 DIAGNOSIS — Z7984 Long term (current) use of oral hypoglycemic drugs: Secondary | ICD-10-CM | POA: Diagnosis not present

## 2017-03-19 DIAGNOSIS — Z1389 Encounter for screening for other disorder: Secondary | ICD-10-CM | POA: Diagnosis not present

## 2017-03-19 DIAGNOSIS — Z1211 Encounter for screening for malignant neoplasm of colon: Secondary | ICD-10-CM | POA: Diagnosis not present

## 2017-03-19 DIAGNOSIS — C9110 Chronic lymphocytic leukemia of B-cell type not having achieved remission: Secondary | ICD-10-CM | POA: Diagnosis not present

## 2017-03-19 DIAGNOSIS — E782 Mixed hyperlipidemia: Secondary | ICD-10-CM | POA: Diagnosis not present

## 2017-03-19 DIAGNOSIS — I10 Essential (primary) hypertension: Secondary | ICD-10-CM | POA: Diagnosis not present

## 2017-03-19 DIAGNOSIS — J309 Allergic rhinitis, unspecified: Secondary | ICD-10-CM | POA: Diagnosis not present

## 2017-03-19 DIAGNOSIS — D638 Anemia in other chronic diseases classified elsewhere: Secondary | ICD-10-CM | POA: Diagnosis not present

## 2017-03-19 DIAGNOSIS — Z0000 Encounter for general adult medical examination without abnormal findings: Secondary | ICD-10-CM | POA: Diagnosis not present

## 2017-05-16 DIAGNOSIS — B351 Tinea unguium: Secondary | ICD-10-CM | POA: Diagnosis not present

## 2017-05-16 DIAGNOSIS — E119 Type 2 diabetes mellitus without complications: Secondary | ICD-10-CM | POA: Diagnosis not present

## 2017-05-18 DIAGNOSIS — I10 Essential (primary) hypertension: Secondary | ICD-10-CM | POA: Diagnosis not present

## 2017-06-13 ENCOUNTER — Encounter: Payer: Self-pay | Admitting: Hematology and Oncology

## 2017-06-13 ENCOUNTER — Inpatient Hospital Stay: Payer: Medicare HMO

## 2017-06-13 ENCOUNTER — Inpatient Hospital Stay: Payer: Medicare HMO | Attending: Hematology and Oncology | Admitting: Hematology and Oncology

## 2017-06-13 ENCOUNTER — Telehealth: Payer: Self-pay | Admitting: *Deleted

## 2017-06-13 ENCOUNTER — Other Ambulatory Visit: Payer: Commercial Managed Care - HMO

## 2017-06-13 VITALS — BP 144/78 | HR 76 | Temp 97.8°F | Wt 270.1 lb

## 2017-06-13 DIAGNOSIS — I129 Hypertensive chronic kidney disease with stage 1 through stage 4 chronic kidney disease, or unspecified chronic kidney disease: Secondary | ICD-10-CM | POA: Insufficient documentation

## 2017-06-13 DIAGNOSIS — E1122 Type 2 diabetes mellitus with diabetic chronic kidney disease: Secondary | ICD-10-CM | POA: Diagnosis not present

## 2017-06-13 DIAGNOSIS — N189 Chronic kidney disease, unspecified: Secondary | ICD-10-CM | POA: Insufficient documentation

## 2017-06-13 DIAGNOSIS — C9110 Chronic lymphocytic leukemia of B-cell type not having achieved remission: Secondary | ICD-10-CM

## 2017-06-13 DIAGNOSIS — Z79899 Other long term (current) drug therapy: Secondary | ICD-10-CM | POA: Diagnosis not present

## 2017-06-13 DIAGNOSIS — Z7982 Long term (current) use of aspirin: Secondary | ICD-10-CM | POA: Diagnosis not present

## 2017-06-13 DIAGNOSIS — Z7984 Long term (current) use of oral hypoglycemic drugs: Secondary | ICD-10-CM | POA: Insufficient documentation

## 2017-06-13 DIAGNOSIS — Z800 Family history of malignant neoplasm of digestive organs: Secondary | ICD-10-CM | POA: Diagnosis not present

## 2017-06-13 DIAGNOSIS — E781 Pure hyperglyceridemia: Secondary | ICD-10-CM

## 2017-06-13 DIAGNOSIS — D509 Iron deficiency anemia, unspecified: Secondary | ICD-10-CM | POA: Insufficient documentation

## 2017-06-13 LAB — CBC WITH DIFFERENTIAL/PLATELET
Basophils Absolute: 0.1 10*3/uL (ref 0–0.1)
Basophils Relative: 0 %
Eosinophils Absolute: 0.1 10*3/uL (ref 0–0.7)
Eosinophils Relative: 0 %
HCT: 35.3 % (ref 35.0–47.0)
Hemoglobin: 11.3 g/dL — ABNORMAL LOW (ref 12.0–16.0)
Lymphocytes Relative: 82 %
Lymphs Abs: 28.1 10*3/uL — ABNORMAL HIGH (ref 1.0–3.6)
MCH: 30.4 pg (ref 26.0–34.0)
MCHC: 31.9 g/dL — ABNORMAL LOW (ref 32.0–36.0)
MCV: 95.2 fL (ref 80.0–100.0)
Monocytes Absolute: 0.6 10*3/uL (ref 0.2–0.9)
Monocytes Relative: 2 %
Neutro Abs: 5.4 10*3/uL (ref 1.4–6.5)
Neutrophils Relative %: 16 %
Platelets: 227 10*3/uL (ref 150–440)
RBC: 3.71 MIL/uL — ABNORMAL LOW (ref 3.80–5.20)
RDW: 15.3 % — ABNORMAL HIGH (ref 11.5–14.5)
WBC: 34.3 10*3/uL — ABNORMAL HIGH (ref 3.6–11.0)

## 2017-06-13 LAB — FERRITIN: Ferritin: 58 ng/mL (ref 11–307)

## 2017-06-13 NOTE — Telephone Encounter (Signed)
-----   Message from Lequita Asal, MD sent at 06/13/2017  1:25 PM EDT ----- Regarding: Please call patient   Ferritin is 58 (goal 100).  Ferritin continues to improve slowly.  M  ----- Message ----- From: Interface, Lab In [place] Sent: 06/13/2017   9:53 AM To: Lequita Asal, MD

## 2017-06-13 NOTE — Addendum Note (Signed)
Addended by: Mallie Snooks I on: 06/13/2017 01:34 PM   Modules accepted: Orders

## 2017-06-13 NOTE — Progress Notes (Signed)
Patient offers no complaint today.

## 2017-06-13 NOTE — Progress Notes (Signed)
[facility] day:  06/13/17  Chief Complaint: Tammy [name] is a 71 y.o. female with chronic lymphocytic leukemia (CLL) and a history of iron deficiency anemia who is seen for 6 month assessment.  HPI:  The patient was last seen in the medical oncology clinic on 12/13/2016.  At that time, she denied any B symptoms.  Exam revealed no adenopathy or hepatosplenomegaly.  Labs included a hematocrit of 35.8, hemoglobin 11.5, MCV 93.4, platelets 207,000, WBC 28,700.  Ferritin was 49.  Iron saturation 24% with a TIBC of 365.  She continued oral iron.  During the interim, she has done well.  She denies fevers, sweats, loss, adenopathy, bruising or bleeding. She denies any infections.  She believes her colonoscopy is scheduled in 08/2017.   Past Medical History:  Diagnosis Date  . Allergic rhinitis   . DA (degenerative arthritis)   . Diabetes mellitus   . Heart murmur   . Hyperlipidemia   . Hypertension   . Hypertriglyceridemia   . Other elevated white blood cell count   . Unspecified deficiency anemia     Past Surgical History:  Procedure Laterality Date  . COLONOSCOPY  2008   most recent  . REPLACEMENT TOTAL KNEE Right   . REPLACEMENT TOTAL KNEE Left     Family History  Problem Relation Age of Onset  . Cancer Father        gastric cancer  . Cancer Sister        gastric cancer    Social History:  reports that she has never smoked. She has never used smokeless tobacco. She reports that she does not drink alcohol or use drugs.  She has recently been to [facility]. Her husband will be having a hip replacement soon. She lives in [place].  The patient is alone today.  Allergies:  Allergies  Allergen Reactions  . Sulfa Antibiotics   . Latex Rash    Current Medications: Current Outpatient Prescriptions  Medication Sig Dispense Refill  . ACCU-CHEK FASTCLIX LANCETS MISC     . ACCU-CHEK SMARTVIEW test strip     . amLODipine  (NORVASC) 2.5 MG tablet Take 2.5 mg by mouth daily.  11  . aspirin 81 MG tablet Take 81 mg by mouth daily.    . benazepril-hydrochlorthiazide (LOTENSIN HCT) 20-12.5 MG per tablet Take 1 tablet by mouth daily.    . Blood Glucose Calibration (ACCU-CHEK SMARTVIEW CONTROL) LIQD     . cholecalciferol (VITAMIN D) 400 UNITS TABS Take 400 Units by mouth daily.    . ferrous sulfate 325 (65 FE) MG tablet Take 325 mg by mouth daily with breakfast.    . glimepiride (AMARYL) 4 MG tablet Take 4 mg by mouth daily before breakfast.    . Multiple Vitamins-Minerals (MULTIVITAMIN PO) Take by mouth.    . pioglitazone (ACTOS) 45 MG tablet Take 45 mg by mouth daily.    . sitaGLIPtin (JANUVIA) 50 MG tablet Take 50 mg by mouth daily.    . verapamil (CALAN-SR) 180 MG CR tablet Take 180 mg by mouth at bedtime.    . famciclovir (FAMVIR) 500 MG tablet TAKE 1 TABLET BY MOUTH TWICE A DAY FOR 7 DAYS  0   No current facility-administered medications for this visit.     Review of Systems:  GENERAL:  Feels good.  No fevers or sweats.  Weight stable. PERFORMANCE STATUS (ECOG):  0 HEENT:  No visual changes, runny nose, sore throat, mouth sores  or tenderness. Lungs: No shortness of breath or cough.  No hemoptysis. Cardiac:  No chest pain, palpitations, orthopnea, or PND. GI:  No nausea, vomiting, diarrhea, constipation, melena or hematochezia. GU:  No urgency, frequency, dysuria, or hematuria. Musculoskeletal:  No back pain.  Knee issues s/p bilateral replacement.  No muscle tenderness. Extremities:  No pain or swelling. Skin:  No rashes or skin changes. Neuro:  No headache, numbness or weakness, balance or coordination issues. Endocrine:  Diabetes.  No thyroid issues, hot flashes or night sweats. Psych:  No mood changes, depression or anxiety. Pain:  No focal pain. Review of systems:  All other systems reviewed and found to be negative.   Physical Exam:  Blood pressure 143/72, pulse 81, temperature 95.8 F (35.4 C),  temperature source Tympanic, Height 5'9", weight 278 pounds 3.5 ounces (126.2 kg) GENERAL:  Well developed, well nourished, heavyset woman sitting comfortably in the exam room in no acute distress. MENTAL STATUS:  Alert and oriented to person, place and time. HEAD:  Brown hair pulled back.  Normocephalic, atraumatic, face symmetric, no Cushingoid features. EYES:  Brown eyes.  Pupils equal round and reactive to light and accomodation.  No conjunctivitis or scleral icterus. ENT:  Oropharynx clear without lesion.  Tongue normal. Mucous membranes moist.  RESPIRATORY:  Clear to auscultation without rales, wheezes or rhonchi. CARDIOVASCULAR:  Regular rate and rhythm without murmur, rub or gallop. ABDOMEN:  Soft, non-tender, with active bowel sounds, and no appreciable hepatosplenomegaly.  No masses. SKIN:  No rashes, ulcers or lesions. EXTREMITIES: No edema, no skin discoloration or tenderness.  No palpable cords. LYMPH NODES: No palpable cervical, supraclavicular, axillary or inguinal adenopathy  NEUROLOGICAL: Unremarkable. PSYCH:  Appropriate.    Appointment on 06/13/2017  Component Date Value Ref Range Status  . WBC 06/13/2017 34.3* 3.6 - 11.0 K/uL Final  . RBC 06/13/2017 3.71* 3.80 - 5.20 MIL/uL Final  . Hemoglobin 06/13/2017 11.3* 12.0 - 16.0 g/dL Final  . HCT 06/13/2017 35.3  35.0 - 47.0 % Final  . MCV 06/13/2017 95.2  80.0 - 100.0 fL Final  . MCH 06/13/2017 30.4  26.0 - 34.0 pg Final  . MCHC 06/13/2017 31.9* 32.0 - 36.0 g/dL Final  . RDW 06/13/2017 15.3* 11.5 - 14.5 % Final  . Platelets 06/13/2017 227  150 - 440 K/uL Final  . Neutrophils Relative % 06/13/2017 16  % Final  . Neutro Abs 06/13/2017 5.4  1.4 - 6.5 K/uL Final  . Lymphocytes Relative 06/13/2017 82  % Final  . Lymphs Abs 06/13/2017 28.1* 1.0 - 3.6 K/uL Final  . Monocytes Relative 06/13/2017 2  % Final  . Monocytes Absolute 06/13/2017 0.6  0.2 - 0.9 K/uL Final  . Eosinophils Relative 06/13/2017 0  % Final  . Eosinophils  Absolute 06/13/2017 0.1  0 - 0.7 K/uL Final  . Basophils Relative 06/13/2017 0  % Final  . Basophils Absolute 06/13/2017 0.1  0 - 0.1 K/uL Final    Assessment:  Tammy [name] is a 71 y.o. female with stage 0 CLL diagnosed in 07/2009.  She presented with lymphocytosis.  WBC has ranged between 13,600 -. 32,500 without a trend.  Platelet count is normal.  She has had no issues with bruising or bleeding.    She has a history of iron deficiency anemia.  Diet is good (she eats meat 4 times/week).  She is on oral iron (ferrous sulfate) every other day.  She denies any bleeding.  Colonoscopy is due in 2018.  Guaiac cards  were - x 3 in 05/2015.  She has chronic kidney disease (CrCl 46 ml/min).  B12 and folate were normal on 06/14/2016.  Mammogram on 10/04/2016 was negative.  She is scheduled for colonoscopy in 2018.  Symptomatically, she denies any B symptoms.  She denies any infections, bruising or bleeding.  Exam reveals no adenopathy or hepatosplenomegaly.  WBC is 34,300.  Hematocrit and MCV are normal.  Plan: 1.  Labs today:  CBC with diff, ferritin. 2.  Discuss counts.  CLL stable.  Hematocrit normal.  Contact patient with ferritin level. 3.  Anticipate colonoscopy in 08/2017. 4.  RTC in 6 months for MD assessment and labs (CBC with diff, BMP, ferritin).   Lequita Asal, MD  06/13/2017, 11:30 AM

## 2017-06-13 NOTE — Telephone Encounter (Signed)
Called patient to inform her that ferritin is improving.  Goal is 100, now @ 58.

## 2017-07-12 DIAGNOSIS — D126 Benign neoplasm of colon, unspecified: Secondary | ICD-10-CM | POA: Diagnosis not present

## 2017-07-12 DIAGNOSIS — K635 Polyp of colon: Secondary | ICD-10-CM | POA: Diagnosis not present

## 2017-07-12 DIAGNOSIS — Z1211 Encounter for screening for malignant neoplasm of colon: Secondary | ICD-10-CM | POA: Diagnosis not present

## 2017-07-12 DIAGNOSIS — K648 Other hemorrhoids: Secondary | ICD-10-CM | POA: Diagnosis not present

## 2017-07-12 DIAGNOSIS — K5730 Diverticulosis of large intestine without perforation or abscess without bleeding: Secondary | ICD-10-CM | POA: Diagnosis not present

## 2017-07-17 DIAGNOSIS — Z1211 Encounter for screening for malignant neoplasm of colon: Secondary | ICD-10-CM | POA: Diagnosis not present

## 2017-07-17 DIAGNOSIS — D126 Benign neoplasm of colon, unspecified: Secondary | ICD-10-CM | POA: Diagnosis not present

## 2017-07-17 DIAGNOSIS — K635 Polyp of colon: Secondary | ICD-10-CM | POA: Diagnosis not present

## 2017-07-17 DIAGNOSIS — M79672 Pain in left foot: Secondary | ICD-10-CM | POA: Diagnosis not present

## 2017-08-21 ENCOUNTER — Other Ambulatory Visit: Payer: Self-pay | Admitting: Internal Medicine

## 2017-08-21 DIAGNOSIS — Z1231 Encounter for screening mammogram for malignant neoplasm of breast: Secondary | ICD-10-CM

## 2017-09-18 DIAGNOSIS — C9110 Chronic lymphocytic leukemia of B-cell type not having achieved remission: Secondary | ICD-10-CM | POA: Diagnosis not present

## 2017-09-18 DIAGNOSIS — N183 Chronic kidney disease, stage 3 (moderate): Secondary | ICD-10-CM | POA: Diagnosis not present

## 2017-09-18 DIAGNOSIS — E782 Mixed hyperlipidemia: Secondary | ICD-10-CM | POA: Diagnosis not present

## 2017-09-18 DIAGNOSIS — D638 Anemia in other chronic diseases classified elsewhere: Secondary | ICD-10-CM | POA: Diagnosis not present

## 2017-09-18 DIAGNOSIS — E1122 Type 2 diabetes mellitus with diabetic chronic kidney disease: Secondary | ICD-10-CM | POA: Diagnosis not present

## 2017-09-18 DIAGNOSIS — I10 Essential (primary) hypertension: Secondary | ICD-10-CM | POA: Diagnosis not present

## 2017-09-18 DIAGNOSIS — M1990 Unspecified osteoarthritis, unspecified site: Secondary | ICD-10-CM | POA: Diagnosis not present

## 2017-10-07 ENCOUNTER — Other Ambulatory Visit: Payer: Self-pay

## 2017-10-07 ENCOUNTER — Emergency Department: Payer: Medicare HMO

## 2017-10-07 ENCOUNTER — Emergency Department
Admission: EM | Admit: 2017-10-07 | Discharge: 2017-10-07 | Disposition: A | Discharge status: Home / Self Care | Payer: Medicare HMO | Attending: Emergency Medicine | Admitting: Emergency Medicine

## 2017-10-07 ENCOUNTER — Encounter: Payer: Self-pay | Admitting: Emergency Medicine

## 2017-10-07 DIAGNOSIS — E119 Type 2 diabetes mellitus without complications: Secondary | ICD-10-CM | POA: Diagnosis not present

## 2017-10-07 DIAGNOSIS — Z79899 Other long term (current) drug therapy: Secondary | ICD-10-CM | POA: Insufficient documentation

## 2017-10-07 DIAGNOSIS — I10 Essential (primary) hypertension: Secondary | ICD-10-CM | POA: Diagnosis not present

## 2017-10-07 DIAGNOSIS — R42 Dizziness and giddiness: Secondary | ICD-10-CM | POA: Insufficient documentation

## 2017-10-07 DIAGNOSIS — Z7982 Long term (current) use of aspirin: Secondary | ICD-10-CM | POA: Diagnosis not present

## 2017-10-07 DIAGNOSIS — Z7984 Long term (current) use of oral hypoglycemic drugs: Secondary | ICD-10-CM | POA: Diagnosis not present

## 2017-10-07 DIAGNOSIS — Z96653 Presence of artificial knee joint, bilateral: Secondary | ICD-10-CM | POA: Diagnosis not present

## 2017-10-07 DIAGNOSIS — Z91040 Latex allergy status: Secondary | ICD-10-CM | POA: Insufficient documentation

## 2017-10-07 DIAGNOSIS — Z856 Personal history of leukemia: Secondary | ICD-10-CM | POA: Insufficient documentation

## 2017-10-07 LAB — BASIC METABOLIC PANEL
ANION GAP: 7 (ref 5–15)
BUN: 26 mg/dL — AB (ref 6–20)
CALCIUM: 8.7 mg/dL — AB (ref 8.9–10.3)
CO2: 26 mmol/L (ref 22–32)
CREATININE: 1.32 mg/dL — AB (ref 0.44–1.00)
Chloride: 105 mmol/L (ref 101–111)
GFR calc Af Amer: 46 mL/min — ABNORMAL LOW (ref >60)
GFR, EST NON AFRICAN AMERICAN: 40 mL/min — AB (ref >60)
GLUCOSE: 152 mg/dL — AB (ref 65–99)
Potassium: 4.4 mmol/L (ref 3.5–5.1)
Sodium: 138 mmol/L (ref 135–145)

## 2017-10-07 LAB — CBC
HEMATOCRIT: 35.4 % (ref 35.0–47.0)
Hemoglobin: 11.3 g/dL — ABNORMAL LOW (ref 12.0–16.0)
MCH: 30.2 pg (ref 26.0–34.0)
MCHC: 32.1 g/dL (ref 32.0–36.0)
MCV: 94.3 fL (ref 80.0–100.0)
PLATELETS: 215 10*3/uL (ref 150–440)
RBC: 3.75 MIL/uL — ABNORMAL LOW (ref 3.80–5.20)
RDW: 15.3 % — AB (ref 11.5–14.5)
WBC: 29.7 10*3/uL — AB (ref 3.6–11.0)

## 2017-10-07 MED ORDER — MECLIZINE HCL 25 MG PO TABS: 25.0000 mg | ORAL_TABLET | Freq: Three times a day (TID) | ORAL | 0 refills | Status: DC | PRN

## 2017-10-07 MED ORDER — ONDANSETRON HCL 4 MG/2ML IJ SOLN
4.0000 mg | Freq: Once | INTRAMUSCULAR | Status: AC
  Administered 2017-10-07: 4 mg via INTRAVENOUS
  Filled 2017-10-07: qty 2

## 2017-10-07 MED ORDER — MECLIZINE HCL 25 MG PO TABS
25.0000 mg | ORAL_TABLET | Freq: Once | ORAL | Status: AC
  Administered 2017-10-07: 25 mg via ORAL
  Filled 2017-10-07: qty 1

## 2017-10-07 MED ORDER — ONDANSETRON 4 MG PO TBDP: 4.0000 mg | ORAL_TABLET | Freq: Four times a day (QID) | ORAL | 0 refills | Status: DC | PRN

## 2017-10-07 NOTE — ED Provider Notes (Signed)
[facility] Emergency Department Provider Note   ____________________________________________   First MD Initiated Contact with Patient 10/07/17 [phone]     (approximate)  I have reviewed the triage vital signs and the nursing notes.   HISTORY  Chief Complaint Dizziness    HPI Tammy [name] is a 71 y.o. female presents for evaluation of dizziness  Got up this morning, reports that she was saying her prayers, she use the bathroom, felt fine and then as she was walking and getting up from the bathroom she began experiencing sudden feeling like she was "spinning".  She reports she had to lay down and ask for assistance from her husband because the world felt like it was spinning.  Some nausea but no vomiting.  No trouble speaking, no weakness, no numbness or tingling.  Reports the symptoms are better now, worsened when she tries to get up and move her head or walk around.  She has never had these symptoms before he does have a history of diabetes and CLL, no history of stroke.  No recent illness.  Past Medical History:  Diagnosis Date  . Allergic rhinitis   . DA (degenerative arthritis)   . Diabetes mellitus   . Heart murmur   . Hyperlipidemia   . Hypertension   . Hypertriglyceridemia   . Other elevated white blood cell count   . Unspecified deficiency anemia     Patient Active Problem List   Diagnosis Date Noted  . CLL (chronic lymphocytic leukemia) ([place]) 06/03/2015  . Iron deficiency anemia 06/03/2015    Past Surgical History:  Procedure Laterality Date  . COLONOSCOPY  2008   most recent  . REPLACEMENT TOTAL KNEE Right   . REPLACEMENT TOTAL KNEE Left     Prior to Admission medications   Medication Sig Start Date End Date Taking? Authorizing Provider  ACCU-CHEK FASTCLIX LANCETS [place]  05/13/15   [provider]  ACCU-CHEK SMARTVIEW test strip  05/13/15   [provider]  amLODipine (NORVASC) 2.5 MG tablet Take 2.5 mg by  mouth daily. 06/10/17   [provider]  aspirin 81 MG tablet Take 81 mg by mouth daily.    [provider]  benazepril-hydrochlorthiazide (LOTENSIN HCT) 20-12.5 MG per tablet Take 1 tablet by mouth daily.    [provider]  Blood Glucose Calibration (ACCU-CHEK SMARTVIEW CONTROL) LIQD  05/13/15   [provider]  cholecalciferol (VITAMIN D) 400 UNITS TABS Take 400 Units by mouth daily.    [provider]  famciclovir (FAMVIR) 500 MG tablet TAKE 1 TABLET BY MOUTH TWICE A DAY FOR 7 DAYS 12/09/15   [provider]  ferrous sulfate 325 (65 FE) MG tablet Take 325 mg by mouth daily with breakfast.    [provider]  glimepiride (AMARYL) 4 MG tablet Take 4 mg by mouth daily before breakfast.    [provider]  meclizine (ANTIVERT) 25 MG tablet Take 1 tablet (25 mg total) 3 (three) times daily as needed by mouth for dizziness. 10/07/17   Delman Kitten, MD  Multiple Vitamins-Minerals (MULTIVITAMIN PO) Take by mouth.    [provider]  ondansetron (ZOFRAN ODT) 4 MG disintegrating tablet Take 1 tablet (4 mg total) every 6 (six) hours as needed by mouth for nausea or vomiting. 10/07/17   Delman Kitten, MD  pioglitazone (ACTOS) 45 MG tablet Take 45 mg by mouth daily.    [provider]  sitaGLIPtin (JANUVIA) 50 MG tablet Take 50 mg by  mouth daily.    [provider]  verapamil (CALAN-SR) 180 MG CR tablet Take 180 mg by mouth at bedtime.    [provider]    Allergies Sulfa antibiotics and Latex  Family History  Problem Relation Age of Onset  . Cancer Father        gastric cancer  . Cancer Sister        gastric cancer    Social History Social History   Tobacco Use  . Smoking status: Never Smoker  . Smokeless tobacco: Never Used  Substance Use Topics  . Alcohol use: No  . Drug use: No    Review of Systems Constitutional: No fever/chills Eyes: No visual changes.  No double vision. ENT:  No sore throat. Cardiovascular: Denies chest pain. Respiratory: Denies shortness of breath. Gastrointestinal: No abdominal pain.  No vomiting.  No diarrhea.  No constipation. Genitourinary: Negative for dysuria. Musculoskeletal: Negative for back pain. Skin: Negative for rash. Neurological: Negative for headaches, focal weakness or numbness.    ____________________________________________   PHYSICAL EXAM:  VITAL SIGNS: ED Triage Vitals  Enc Vitals Group     BP 10/07/17 0730 (!) 160/67     Pulse Rate 10/07/17 0730 69     Resp 10/07/17 0730 16     Temp 10/07/17 0733 97.8 F (36.6 C)     Temp Source 10/07/17 0730 Oral     SpO2 10/07/17 0730 100 %     Weight 10/07/17 0732 274 lb (124.3 kg)     Height 10/07/17 0732 5\' 10"  (1.778 m)     Head Circumference --      Peak Flow --      Pain Score --      Pain Loc --      Pain Edu? --      Excl. in [place]? --     Constitutional: Alert and oriented. Well appearing and in no acute distress. Eyes: Conjunctivae are normal. Head: Atraumatic. Nose: No congestion/rhinnorhea. Mouth/Throat: Mucous membranes are moist. Neck: No stridor.  No meningismus. Cardiovascular: Normal rate, regular rhythm. Grossly normal heart sounds.  Good peripheral circulation. Respiratory: Normal respiratory effort.  No retractions. Lungs CTAB. Gastrointestinal: Soft and nontender. No distention. Musculoskeletal: No lower extremity tenderness nor edema. Neurologic:  Normal speech and language.   NIH score equals 0, performed by me at bedside. The patient has no pronator drift. The patient has normal cranial nerve exam. Extraocular movements are normal. Visual fields are normal. Patient has 5 out of 5 strength in all extremities, though there is some very minimal difference and weakness in the left leg compared to the right which the patient reports is chronic due to her previous joint replacement. There is no numbness or gross, acute sensory abnormality in the  extremities bilaterally. No speech disturbance. No dysarthria. No aphasia. No ataxia. Normal finger nose finger bilat. Patient speaking in full and clear sentences.  When I saw the patient up and have her turning her head towards the right she does report dizziness and some nystagmus is noted.  It abates quickly.   Skin:  Skin is warm, dry and intact. No rash noted. Psychiatric: Mood and affect are normal. Speech and behavior are normal.  ____________________________________________   LABS (all labs ordered are listed, but only abnormal results are displayed)  Labs Reviewed  CBC - Abnormal; Notable for the following components:      Result Value   WBC 29.7 (*)    RBC 3.75 (*)  Hemoglobin 11.3 (*)    RDW 15.3 (*)    All other components within normal limits  BASIC METABOLIC PANEL - Abnormal; Notable for the following components:   Glucose, Bld 152 (*)    BUN 26 (*)    Creatinine, Ser 1.32 (*)    Calcium 8.7 (*)    GFR calc non Af Amer 40 (*)    GFR calc Af Amer 46 (*)    All other components within normal limits   ____________________________________________  EKG  Reviewed and entered by me at 8 AM Heart rate 70 QRS 140 QTC 450 Normal sinus rhythm, left bundle branch block noted, and is old ____________________________________________  RADIOLOGY  Ct Head Wo Contrast  Result Date: 10/07/2017 CLINICAL DATA:  Dizziness EXAM: CT HEAD WITHOUT CONTRAST TECHNIQUE: Contiguous axial images were obtained from the base of the skull through the vertex without intravenous contrast. COMPARISON:  12/25/2012 FINDINGS: Brain: Mild age related volume loss. No acute intracranial abnormality. Specifically, no hemorrhage, hydrocephalus, mass lesion, acute infarction, or significant intracranial injury. Vascular: No hyperdense vessel or unexpected calcification. Skull: No acute calvarial abnormality. Sinuses/Orbits: Visualized paranasal sinuses and mastoids clear. Orbital soft tissues  unremarkable. Other: None IMPRESSION: No acute intracranial abnormality. Electronically Signed   By: Rolm Baptise M.D.   On: 10/07/2017 08:21    CT reviewed, no acute abnormality ____________________________________________   PROCEDURES  Procedure(s) performed: None  Procedures  Critical Care performed: No  ____________________________________________   INITIAL IMPRESSION / ASSESSMENT AND PLAN / ED COURSE  Pertinent labs & imaging results that were available during my care of the patient were reviewed by me and considered in my medical decision making (see chart for details).  Dizziness.  Examination suggest likely peripheral vertigo.  No evidence of recent infection, no complaints of any respiratory or chest related symptoms.  No neurologic deficits on exam.  Very reassuring examination with some reproducibility of the dizziness when turning her head.  Given the patient's age and past medical history, we will evaluate including obtaining EKG, head CT without contrast, and basic labs.  Overall very reassuring examination at this time though.    ----------------------------------------- 8:29 AM on 10/07/2017 -----------------------------------------  Patient's husband will be driving her home. Return precautions and treatment recommendations and follow-up discussed with the patient who is agreeable with the plan.   ____________________________________________   FINAL CLINICAL IMPRESSION(S) / ED DIAGNOSES  Final diagnoses:  Vertigo      NEW MEDICATIONS STARTED DURING THIS VISIT:  This SmartLink is deprecated. Use AVSMEDLIST instead to display the medication list for a patient.   Note:  This document was prepared using Dragon voice recognition software and may include unintentional dictation errors.     Delman Kitten, MD 10/07/17 [phone]

## 2017-10-07 NOTE — ED Notes (Signed)
Patient transported to CT 

## 2017-10-07 NOTE — ED Triage Notes (Signed)
States woke up this morning and while laying in bed started to feel "head spinning".  Patient then got up and went to the bathroom, symptoms worsened.

## 2017-10-07 NOTE — ED Notes (Signed)
Will wait 30 min per dr Jacqualine Code then walk pt.

## 2017-10-07 NOTE — Discharge Instructions (Signed)
We believe your symptoms were caused by benign vertigo.  Please read through the included information and take any prescribed medication(s).  Follow up with your doctor as listed above.  If you develop any new or worsening symptoms that concern you, including but not limited to persistent dizziness/vertigo, numbness or weakness in your arms or legs, altered mental status, persistent vomiting, or fever greater than 101, please return immediately to the Emergency Department.  

## 2017-10-10 ENCOUNTER — Ambulatory Visit
Admission: RE | Admit: 2017-10-10 | Discharge: 2017-10-10 | Disposition: A | Discharge status: Home / Self Care | Payer: Medicare HMO | Source: Ambulatory Visit | Attending: Internal Medicine | Admitting: Internal Medicine

## 2017-10-10 DIAGNOSIS — Z1231 Encounter for screening mammogram for malignant neoplasm of breast: Secondary | ICD-10-CM | POA: Diagnosis not present

## 2017-10-16 DIAGNOSIS — R42 Dizziness and giddiness: Secondary | ICD-10-CM | POA: Diagnosis not present

## 2017-10-16 DIAGNOSIS — M545 Low back pain: Secondary | ICD-10-CM | POA: Diagnosis not present

## 2017-12-12 ENCOUNTER — Inpatient Hospital Stay: Payer: Medicare HMO

## 2017-12-12 ENCOUNTER — Other Ambulatory Visit: Payer: Self-pay | Admitting: *Deleted

## 2017-12-12 ENCOUNTER — Encounter: Payer: Self-pay | Admitting: Hematology and Oncology

## 2017-12-12 ENCOUNTER — Inpatient Hospital Stay: Payer: Medicare HMO | Attending: Hematology and Oncology | Admitting: Hematology and Oncology

## 2017-12-12 VITALS — BP 159/83 | HR 75 | Temp 97.2°F | Resp 20 | Wt 277.8 lb

## 2017-12-12 DIAGNOSIS — E1122 Type 2 diabetes mellitus with diabetic chronic kidney disease: Secondary | ICD-10-CM

## 2017-12-12 DIAGNOSIS — Z7982 Long term (current) use of aspirin: Secondary | ICD-10-CM

## 2017-12-12 DIAGNOSIS — Z7984 Long term (current) use of oral hypoglycemic drugs: Secondary | ICD-10-CM | POA: Diagnosis not present

## 2017-12-12 DIAGNOSIS — D509 Iron deficiency anemia, unspecified: Secondary | ICD-10-CM

## 2017-12-12 DIAGNOSIS — Z86010 Personal history of colonic polyps: Secondary | ICD-10-CM | POA: Diagnosis not present

## 2017-12-12 DIAGNOSIS — C9110 Chronic lymphocytic leukemia of B-cell type not having achieved remission: Secondary | ICD-10-CM

## 2017-12-12 DIAGNOSIS — Z79899 Other long term (current) drug therapy: Secondary | ICD-10-CM

## 2017-12-12 DIAGNOSIS — Z800 Family history of malignant neoplasm of digestive organs: Secondary | ICD-10-CM | POA: Insufficient documentation

## 2017-12-12 DIAGNOSIS — R42 Dizziness and giddiness: Secondary | ICD-10-CM | POA: Diagnosis not present

## 2017-12-12 DIAGNOSIS — E781 Pure hyperglyceridemia: Secondary | ICD-10-CM | POA: Insufficient documentation

## 2017-12-12 DIAGNOSIS — I129 Hypertensive chronic kidney disease with stage 1 through stage 4 chronic kidney disease, or unspecified chronic kidney disease: Secondary | ICD-10-CM

## 2017-12-12 DIAGNOSIS — N189 Chronic kidney disease, unspecified: Secondary | ICD-10-CM | POA: Insufficient documentation

## 2017-12-12 LAB — CBC WITH DIFFERENTIAL/PLATELET
Basophils Absolute: 0.1 10*3/uL (ref 0–0.1)
Basophils Relative: 0 %
Eosinophils Absolute: 0.1 10*3/uL (ref 0–0.7)
Eosinophils Relative: 0 %
HCT: 34.2 % — ABNORMAL LOW (ref 35.0–47.0)
Hemoglobin: 11.1 g/dL — ABNORMAL LOW (ref 12.0–16.0)
Lymphocytes Relative: 86 %
Lymphs Abs: 24.2 10*3/uL — ABNORMAL HIGH (ref 1.0–3.6)
MCH: 30.8 pg (ref 26.0–34.0)
MCHC: 32.4 g/dL (ref 32.0–36.0)
MCV: 94.9 fL (ref 80.0–100.0)
Monocytes Absolute: 0.6 10*3/uL (ref 0.2–0.9)
Monocytes Relative: 2 %
Neutro Abs: 3.3 10*3/uL (ref 1.4–6.5)
Neutrophils Relative %: 12 %
Platelets: 200 10*3/uL (ref 150–440)
RBC: 3.61 MIL/uL — ABNORMAL LOW (ref 3.80–5.20)
RDW: 15.2 % — ABNORMAL HIGH (ref 11.5–14.5)
WBC: 28.2 10*3/uL — ABNORMAL HIGH (ref 3.6–11.0)

## 2017-12-12 LAB — BASIC METABOLIC PANEL
Anion gap: 7 (ref 5–15)
BUN: 28 mg/dL — ABNORMAL HIGH (ref 6–20)
CO2: 28 mmol/L (ref 22–32)
Calcium: 8.6 mg/dL — ABNORMAL LOW (ref 8.9–10.3)
Chloride: 103 mmol/L (ref 101–111)
Creatinine, Ser: 1.4 mg/dL — ABNORMAL HIGH (ref 0.44–1.00)
GFR calc Af Amer: 43 mL/min — ABNORMAL LOW (ref >60)
GFR calc non Af Amer: 37 mL/min — ABNORMAL LOW (ref >60)
Glucose, Bld: 159 mg/dL — ABNORMAL HIGH (ref 65–99)
Potassium: 4 mmol/L (ref 3.5–5.1)
Sodium: 138 mmol/L (ref 135–145)

## 2017-12-12 LAB — FERRITIN: Ferritin: 56 ng/mL (ref 11–307)

## 2017-12-12 NOTE — Progress Notes (Signed)
Patient offers no complaints today. 

## 2017-12-12 NOTE — Progress Notes (Signed)
[facility] day:  12/12/17  Chief Complaint: Tammy [name] is a 72 y.o. female with chronic lymphocytic leukemia (CLL) and a history of iron deficiency anemia who is seen for 6 month assessment.  HPI:  The patient was last seen in the medical oncology clinic on 06/13/2017.  At that time, she denied any B symptoms.  Exam revealed no adenopathy or hepatosplenomegaly.  WBC was 34,300.  Hematocrit and MCV were normal.  Ferritin was 58.  She underwent colonoscopy in [facility] by Dr Alessandra Bevels on 07/12/2017.  She had three 4-6 mm polyps.  Pathology revealed tubular adenomas in the ascending colon and rectum.  The cecum polyp was polypoid colonic mucosa negative for dysplasia.  Repeat colonoscopy is planned in 5 years.  Screening bilateral mammogram on 10/10/2017 was negative.  She was seen in the [facility] ER on 10/07/2017 with dizziness.  Head CT was negative.  She was diagnosed with vertigo.  During the interim, she has done well.  She denies any B symptoms, infections, bruising or bleeding or adenopathy.   Past Medical History:  Diagnosis Date  . Allergic rhinitis   . DA (degenerative arthritis)   . Diabetes mellitus   . Heart murmur   . Hyperlipidemia   . Hypertension   . Hypertriglyceridemia   . Other elevated white blood cell count   . Unspecified deficiency anemia     Past Surgical History:  Procedure Laterality Date  . COLONOSCOPY  2008   most recent  . REPLACEMENT TOTAL KNEE Right   . REPLACEMENT TOTAL KNEE Left     Family History  Problem Relation Age of Onset  . Cancer Father        gastric cancer  . Cancer Sister        gastric cancer  . Breast cancer Neg Hx     Social History:  reports that  has never smoked. she has never used smokeless tobacco. She reports that she does not drink alcohol or use drugs.  She has recently been to [facility]. Her husband will be having a hip replacement soon. She lives in [place].   The patient is alone today.  Allergies:  Allergies  Allergen Reactions  . Sulfa Antibiotics   . Latex Rash    Current Medications: Current Outpatient Medications  Medication Sig Dispense Refill  . ACCU-CHEK FASTCLIX LANCETS [place]     . ACCU-CHEK SMARTVIEW test strip     . amLODipine (NORVASC) 2.5 MG tablet Take 2.5 mg by mouth daily.  11  . aspirin 81 MG tablet Take 81 mg by mouth daily.    . benazepril-hydrochlorthiazide (LOTENSIN HCT) 20-12.5 MG per tablet Take 1 tablet by mouth daily.    . Blood Glucose Calibration (ACCU-CHEK SMARTVIEW CONTROL) LIQD     . cholecalciferol (VITAMIN D) 400 UNITS TABS Take 400 Units by mouth daily.    . famciclovir (FAMVIR) 500 MG tablet TAKE 1 TABLET BY MOUTH TWICE A DAY FOR 7 DAYS  0  . ferrous sulfate 325 (65 FE) MG tablet Take 325 mg by mouth daily with breakfast.    . glimepiride (AMARYL) 4 MG tablet Take 4 mg by mouth daily before breakfast.    . meclizine (ANTIVERT) 25 MG tablet Take 1 tablet (25 mg total) 3 (three) times daily as needed by mouth for dizziness. 30 tablet 0  . Multiple Vitamins-Minerals (MULTIVITAMIN PO) Take by mouth.    . ondansetron (ZOFRAN ODT) 4 MG disintegrating tablet Take  1 tablet (4 mg total) every 6 (six) hours as needed by mouth for nausea or vomiting. 20 tablet 0  . pioglitazone (ACTOS) 45 MG tablet Take 45 mg by mouth daily.    . sitaGLIPtin (JANUVIA) 50 MG tablet Take 50 mg by mouth daily.    . verapamil (CALAN-SR) 180 MG CR tablet Take 180 mg by mouth at bedtime.     No current facility-administered medications for this visit.     Review of Systems:  GENERAL:  Feels "good".  No fevers or sweats.  Weight up 7 pounds. PERFORMANCE STATUS (ECOG):  0 HEENT:  No visual changes, runny nose, sore throat, mouth sores or tenderness. Lungs: No shortness of breath or cough.  No hemoptysis. Cardiac:  No chest pain, palpitations, orthopnea, or PND. GI:  No nausea, vomiting, diarrhea, constipation, melena or  hematochezia. GU:  No urgency, frequency, dysuria, or hematuria. Musculoskeletal:  No back pain.  Knee issues s/p bilateral replacement.  No muscle tenderness. Extremities:  No pain or swelling. Skin:  No rashes or skin changes. Neuro:  No headache, numbness or weakness, balance or coordination issues. Interval vertigo (see HPI). Endocrine:  Diabetes.  No thyroid issues, hot flashes or night sweats. Psych:  No mood changes, depression or anxiety. Pain:  No focal pain. Review of systems:  All other systems reviewed and found to be negative.   Physical Exam:  Blood pressure 143/72, pulse 81, temperature 95.8 F (35.4 C), temperature source Tympanic, Height 5'9", weight 278 pounds 3.5 ounces (126.2 kg) GENERAL:  Well developed, well nourished, heavyset woman sitting comfortably in the exam room in no acute distress. MENTAL STATUS:  Alert and oriented to person, place and time. HEAD:  Brown hair.  Normocephalic, atraumatic, face symmetric, no Cushingoid features. EYES:  Brown eyes.  Pupils equal round and reactive to light and accomodation.  No conjunctivitis or scleral icterus. ENT:  Oropharynx clear without lesion.  Tongue normal. Mucous membranes moist.  RESPIRATORY:  Clear to auscultation without rales, wheezes or rhonchi. CARDIOVASCULAR:  Regular rate and rhythm with soft II/VI murmer.  No rubs or gallop. ABDOMEN:  Soft, non-tender, with active bowel sounds, and no appreciable hepatosplenomegaly.  No masses. SKIN:  No rashes, ulcers or lesions. EXTREMITIES: No edema, no skin discoloration or tenderness.  No palpable cords. LYMPH NODES: No palpable cervical, supraclavicular, axillary or inguinal adenopathy  NEUROLOGICAL: Unremarkable. PSYCH:  Appropriate.    Appointment on 12/12/2017  Component Date Value Ref Range Status  . Ferritin 12/12/2017 56  11 - 307 ng/mL Final   Performed at [facility], [address] [place], [place] [address]  . WBC 12/12/2017 28.2* 3.6 - 11.0  K/uL Final  . RBC 12/12/2017 3.61* 3.80 - 5.20 MIL/uL Final  . Hemoglobin 12/12/2017 11.1* 12.0 - 16.0 g/dL Final  . HCT 12/12/2017 34.2* 35.0 - 47.0 % Final  . MCV 12/12/2017 94.9  80.0 - 100.0 fL Final  . MCH 12/12/2017 30.8  26.0 - 34.0 pg Final  . MCHC 12/12/2017 32.4  32.0 - 36.0 g/dL Final  . RDW 12/12/2017 15.2* 11.5 - 14.5 % Final  . Platelets 12/12/2017 200  150 - 440 K/uL Final  . Neutrophils Relative % 12/12/2017 12  % Final  . Neutro Abs 12/12/2017 3.3  1.4 - 6.5 K/uL Final  . Lymphocytes Relative 12/12/2017 86  % Final  . Lymphs Abs 12/12/2017 24.2* 1.0 - 3.6 K/uL Final  . Monocytes Relative 12/12/2017 2  % Final  . Monocytes Absolute 12/12/2017  0.6  0.2 - 0.9 K/uL Final  . Eosinophils Relative 12/12/2017 0  % Final  . Eosinophils Absolute 12/12/2017 0.1  0 - 0.7 K/uL Final  . Basophils Relative 12/12/2017 0  % Final  . Basophils Absolute 12/12/2017 0.1  0 - 0.1 K/uL Final   Performed at [facility], [address] [place], [place] [address]  . Sodium 12/12/2017 138  135 - 145 mmol/L Final  . Potassium 12/12/2017 4.0  3.5 - 5.1 mmol/L Final  . Chloride 12/12/2017 103  101 - 111 mmol/L Final  . CO2 12/12/2017 28  22 - 32 mmol/L Final  . Glucose, Bld 12/12/2017 159* 65 - 99 mg/dL Final  . BUN 12/12/2017 28* 6 - 20 mg/dL Final  . Creatinine, Ser 12/12/2017 1.40* 0.44 - 1.00 mg/dL Final  . Calcium 12/12/2017 8.6* 8.9 - 10.3 mg/dL Final  . GFR calc non Af Amer 12/12/2017 37* >60 mL/min Final  . GFR calc Af Amer 12/12/2017 43* >60 mL/min Final   Comment: (NOTE) The eGFR has been calculated using the CKD EPI equation. This calculation has not been validated in all clinical situations. eGFR's persistently <60 mL/min signify possible Chronic Kidney Disease.   Georgiann Hahn gap 12/12/2017 7  5 - 15 Final   Performed at [facility], [address], [place], [place] [address]    Assessment:  Tammy [name] is a 72 y.o. female with stage 0 CLL diagnosed  in 07/2009.  She presented with lymphocytosis.  WBC has ranged between 13,600 -. 32,500 without a trend.  Platelet count is normal.  She has had no issues with bruising or bleeding.    She has a history of iron deficiency anemia.  Diet is good (she eats meat 4 times/week).  She is on oral iron (ferrous sulfate) every other day.  She denies any bleeding.  Colonoscopy is due in 2018.  Guaiac cards were - x 3 in 05/2015.  She has chronic kidney disease (CrCl 46 ml/min).  B12 and folate were normal on 06/14/2016.  Mammogram on 10/04/2016 was negative.  Colonoscopy on 07/12/2017 revealed three 4-6 mm polyps.  Pathology revealed tubular adenomas in the ascending colon and rectum.  The cecum polyp was polypoid colonic mucosa negative for dysplasia.  Repeat colonoscopy is planned in 5 years.  Symptomatically, she denies any B symptoms.  She denies any infections, bruising or bleeding.  Exam reveals no adenopathy or appreciable hepatosplenomegaly.  WBC is 28,200.  Hemoglobin is 11.1.  Ferritin is 56.  Plan: 1.  Labs today:  CBC with diff, BMP, ferritin. 2.  Discuss counts.  CLL stable.  Hemoglobin is slightly low.  Continue surveillance. 3.  Discuss colonoscopy.  Follow-up planned in 5 years. 4.  RTC in 6 months for MD assessment and labs (CBC with diff, BMP, ferritin).   Lequita Asal, MD  12/12/2017, 1:15 PM

## 2017-12-24 ENCOUNTER — Encounter: Payer: Self-pay | Admitting: Gastroenterology

## 2017-12-25 DIAGNOSIS — M24662 Ankylosis, left knee: Secondary | ICD-10-CM | POA: Diagnosis not present

## 2017-12-25 DIAGNOSIS — Z96653 Presence of artificial knee joint, bilateral: Secondary | ICD-10-CM | POA: Diagnosis not present

## 2017-12-27 ENCOUNTER — Other Ambulatory Visit: Payer: Self-pay | Admitting: Orthopedic Surgery

## 2017-12-27 DIAGNOSIS — Z96653 Presence of artificial knee joint, bilateral: Secondary | ICD-10-CM

## 2017-12-27 DIAGNOSIS — E119 Type 2 diabetes mellitus without complications: Secondary | ICD-10-CM | POA: Diagnosis not present

## 2017-12-30 DIAGNOSIS — J209 Acute bronchitis, unspecified: Secondary | ICD-10-CM | POA: Diagnosis not present

## 2018-01-10 ENCOUNTER — Encounter
Admission: RE | Admit: 2018-01-10 | Discharge: 2018-01-10 | Disposition: A | Discharge status: Home / Self Care | Payer: Medicare HMO | Source: Ambulatory Visit | Attending: Orthopedic Surgery | Admitting: Orthopedic Surgery

## 2018-01-10 DIAGNOSIS — Z96653 Presence of artificial knee joint, bilateral: Secondary | ICD-10-CM | POA: Diagnosis not present

## 2018-01-10 DIAGNOSIS — Z471 Aftercare following joint replacement surgery: Secondary | ICD-10-CM | POA: Diagnosis not present

## 2018-01-10 MED ORDER — TECHNETIUM TC 99M MEDRONATE IV KIT
25.0000 | PACK | Freq: Once | INTRAVENOUS | Status: AC | PRN
  Administered 2018-01-10: 23.88 via INTRAVENOUS

## 2018-02-07 DIAGNOSIS — M24662 Ankylosis, left knee: Secondary | ICD-10-CM | POA: Diagnosis not present

## 2018-02-07 DIAGNOSIS — Z96652 Presence of left artificial knee joint: Secondary | ICD-10-CM | POA: Diagnosis not present

## 2018-05-03 ENCOUNTER — Encounter
Admission: RE | Admit: 2018-05-03 | Discharge: 2018-05-03 | Disposition: A | Discharge status: Home / Self Care | Payer: Medicare HMO | Source: Ambulatory Visit | Attending: Orthopedic Surgery | Admitting: Orthopedic Surgery

## 2018-05-03 ENCOUNTER — Other Ambulatory Visit: Payer: Self-pay

## 2018-05-03 DIAGNOSIS — Z01810 Encounter for preprocedural cardiovascular examination: Secondary | ICD-10-CM | POA: Insufficient documentation

## 2018-05-03 DIAGNOSIS — I10 Essential (primary) hypertension: Secondary | ICD-10-CM | POA: Insufficient documentation

## 2018-05-03 DIAGNOSIS — E119 Type 2 diabetes mellitus without complications: Secondary | ICD-10-CM | POA: Insufficient documentation

## 2018-05-03 DIAGNOSIS — Z01812 Encounter for preprocedural laboratory examination: Secondary | ICD-10-CM | POA: Diagnosis not present

## 2018-05-03 HISTORY — DX: Headache: R51

## 2018-05-03 HISTORY — DX: Chronic kidney disease, unspecified: N18.9

## 2018-05-03 HISTORY — DX: Personal history of urinary calculi: Z87.442

## 2018-05-03 HISTORY — DX: Headache, unspecified: R51.9

## 2018-05-03 HISTORY — DX: Iron deficiency anemia, unspecified: D50.9

## 2018-05-03 HISTORY — DX: Chronic lymphocytic leukemia of B-cell type not having achieved remission: C91.10

## 2018-05-03 LAB — COMPREHENSIVE METABOLIC PANEL
ALT: 19 U/L (ref 14–54)
ANION GAP: 9 (ref 5–15)
AST: 18 U/L (ref 15–41)
Albumin: 4 g/dL (ref 3.5–5.0)
Alkaline Phosphatase: 72 U/L (ref 38–126)
BILIRUBIN TOTAL: 0.5 mg/dL (ref 0.3–1.2)
BUN: 29 mg/dL — AB (ref 6–20)
CHLORIDE: 104 mmol/L (ref 101–111)
CO2: 26 mmol/L (ref 22–32)
Calcium: 9 mg/dL (ref 8.9–10.3)
Creatinine, Ser: 1.3 mg/dL — ABNORMAL HIGH (ref 0.44–1.00)
GFR, EST AFRICAN AMERICAN: 47 mL/min — AB (ref >60)
GFR, EST NON AFRICAN AMERICAN: 40 mL/min — AB (ref >60)
Glucose, Bld: 193 mg/dL — ABNORMAL HIGH (ref 65–99)
POTASSIUM: 3.6 mmol/L (ref 3.5–5.1)
Sodium: 139 mmol/L (ref 135–145)
TOTAL PROTEIN: 6.9 g/dL (ref 6.5–8.1)

## 2018-05-03 LAB — PROTIME-INR
INR: 1.06
PROTHROMBIN TIME: 13.7 s (ref 11.4–15.2)

## 2018-05-03 LAB — CBC WITH DIFFERENTIAL/PLATELET
BAND NEUTROPHILS: 0 %
BASOS ABS: 0.3 10*3/uL — AB (ref 0–0.1)
Basophils Relative: 1 %
Blasts: 0 %
EOS PCT: 1 %
Eosinophils Absolute: 0.3 10*3/uL (ref 0–0.7)
HEMATOCRIT: 35.5 % (ref 35.0–47.0)
Hemoglobin: 11.3 g/dL — ABNORMAL LOW (ref 12.0–16.0)
LYMPHS ABS: 22.8 10*3/uL — AB (ref 1.0–3.6)
Lymphocytes Relative: 86 %
MCH: 30.3 pg (ref 26.0–34.0)
MCHC: 31.8 g/dL — AB (ref 32.0–36.0)
MCV: 95.1 fL (ref 80.0–100.0)
METAMYELOCYTES PCT: 0 %
MONOS PCT: 1 %
Monocytes Absolute: 0.3 10*3/uL (ref 0.2–0.9)
Myelocytes: 0 %
NEUTROS ABS: 2.9 10*3/uL (ref 1.4–6.5)
Neutrophils Relative %: 11 %
Other: 0 %
Platelets: 207 10*3/uL (ref 150–440)
Promyelocytes Relative: 0 %
RBC: 3.73 MIL/uL — AB (ref 3.80–5.20)
RDW: 15.6 % — AB (ref 11.5–14.5)
WBC: 26.6 10*3/uL — AB (ref 3.6–11.0)
nRBC: 0 /100 WBC

## 2018-05-03 LAB — URINALYSIS, ROUTINE W REFLEX MICROSCOPIC
BILIRUBIN URINE: NEGATIVE
GLUCOSE, UA: NEGATIVE mg/dL
Ketones, ur: NEGATIVE mg/dL
LEUKOCYTES UA: NEGATIVE
NITRITE: NEGATIVE
PH: 5 (ref 5.0–8.0)
Protein, ur: NEGATIVE mg/dL
SPECIFIC GRAVITY, URINE: 1.015 (ref 1.005–1.030)

## 2018-05-03 LAB — SURGICAL PCR SCREEN
MRSA, PCR: NEGATIVE
STAPHYLOCOCCUS AUREUS: NEGATIVE

## 2018-05-03 LAB — HEMOGLOBIN A1C
Hgb A1c MFr Bld: 7.9 % — ABNORMAL HIGH (ref 4.8–5.6)
Mean Plasma Glucose: 180.03 mg/dL

## 2018-05-03 LAB — APTT: aPTT: 27 seconds (ref 24–36)

## 2018-05-03 LAB — SEDIMENTATION RATE: Sed Rate: 31 mm/hr — ABNORMAL HIGH (ref 0–30)

## 2018-05-03 LAB — C-REACTIVE PROTEIN

## 2018-05-03 NOTE — Pre-Procedure Instructions (Signed)
Written and verbal instructions given for incentive spirometer; acknowledges understanding.

## 2018-05-03 NOTE — Patient Instructions (Addendum)
Your procedure is scheduled on: Wednesday, May 15, 2018 Report to Day Surgery on the 2nd floor of the [facility]. To find out your arrival time, please call [phone] between 1PM - 3PM on: Tuesday, May 14, 2018  REMEMBER: Instructions that are not followed completely may result in serious medical risk, up to and including death; or upon the discretion of your surgeon and anesthesiologist your surgery may need to be rescheduled.  Do not eat food after midnight the night before your procedure.  No gum chewing, lozengers or hard candies.  You may however, drink water up to 2 hours before you are scheduled to arrive for your surgery. Do not drink anything within 2 hours of the start of your surgery.  No Alcohol for 24 hours before or after surgery.  No Smoking including e-cigarettes for 24 hours prior to surgery.  No chewable tobacco products for at least 6 hours prior to surgery.  No nicotine patches on the day of surgery.  On the morning of surgery brush your teeth with toothpaste and water, you may rinse your mouth with mouthwash if you wish. Do not swallow any toothpaste or mouthwash.  Notify your doctor if there is any change in your medical condition (cold, fever, infection).  Do not wear jewelry, make-up, hairpins, clips or nail polish.  Do not wear lotions, powders, or perfumes. You may wear deodorant.  Do not shave 48 hours prior to surgery.   Contacts and dentures may not be worn into surgery.  Do not bring valuables to the hospital, including drivers license, insurance or credit cards.  [facility] is not responsible for any belongings or valuables.   TAKE THESE MEDICATIONS THE MORNING OF SURGERY:  1.  VERAPAMIL  Use CHG Soap as directed on instruction sheet.   Stop JANUVIA 2 days prior to surgery. (LAST DAY TO TAKE IS Sunday, June 23) - RESUME AFTER SURGERY  Follow recommendations from Cardiologist or PCP regarding stopping Aspirin.  ON June 19 - Stop  Anti-inflammatories (NSAIDS) such as Advil, Aleve, Ibuprofen, Motrin, Naproxen, Naprosyn and Aspirin based products such as Excedrin, Goodys Powder, BC Powder. (May take Tylenol or Acetaminophen if needed.)  ON June 19 - Stop ANY OVER THE COUNTER supplements until after surgery. (May continue Vitamin D and multivitamin.)  Wear comfortable clothing (specific to your surgery type) to the hospital.  Plan for stool softeners for home use.  If you are being admitted to the hospital overnight, leave your suitcase in the car. After surgery it may be brought to your room.  Please call [phone] if you have any questions about these instructions.

## 2018-05-04 NOTE — Pre-Procedure Instructions (Signed)
Sed rate and A1C results sent to Dr. Marry Guan for review.

## 2018-05-15 ENCOUNTER — Encounter: Payer: Self-pay | Admitting: Orthopedic Surgery

## 2018-05-15 ENCOUNTER — Inpatient Hospital Stay: Payer: Medicare HMO

## 2018-05-15 ENCOUNTER — Encounter
Admission: RE | Disposition: A | Discharge status: Skilled Nursing Facility | Payer: Self-pay | Source: Home / Self Care | Attending: Orthopedic Surgery

## 2018-05-15 ENCOUNTER — Inpatient Hospital Stay
Admission: RE | Admit: 2018-05-15 | Discharge: 2018-05-17 | DRG: 489 | Disposition: A | Discharge status: Skilled Nursing Facility | Payer: Medicare HMO | Attending: Orthopedic Surgery | Admitting: Orthopedic Surgery

## 2018-05-15 ENCOUNTER — Inpatient Hospital Stay: Payer: Medicare HMO | Admitting: Anesthesiology

## 2018-05-15 DIAGNOSIS — E1122 Type 2 diabetes mellitus with diabetic chronic kidney disease: Secondary | ICD-10-CM | POA: Diagnosis present

## 2018-05-15 DIAGNOSIS — J309 Allergic rhinitis, unspecified: Secondary | ICD-10-CM | POA: Diagnosis present

## 2018-05-15 DIAGNOSIS — Z96652 Presence of left artificial knee joint: Secondary | ICD-10-CM | POA: Diagnosis not present

## 2018-05-15 DIAGNOSIS — T8482XA Fibrosis due to internal orthopedic prosthetic devices, implants and grafts, initial encounter: Secondary | ICD-10-CM | POA: Diagnosis not present

## 2018-05-15 DIAGNOSIS — Z79899 Other long term (current) drug therapy: Secondary | ICD-10-CM | POA: Diagnosis not present

## 2018-05-15 DIAGNOSIS — E785 Hyperlipidemia, unspecified: Secondary | ICD-10-CM | POA: Diagnosis not present

## 2018-05-15 DIAGNOSIS — N189 Chronic kidney disease, unspecified: Secondary | ICD-10-CM | POA: Diagnosis not present

## 2018-05-15 DIAGNOSIS — E781 Pure hyperglyceridemia: Secondary | ICD-10-CM | POA: Diagnosis present

## 2018-05-15 DIAGNOSIS — Z823 Family history of stroke: Secondary | ICD-10-CM

## 2018-05-15 DIAGNOSIS — R2689 Other abnormalities of gait and mobility: Secondary | ICD-10-CM | POA: Diagnosis not present

## 2018-05-15 DIAGNOSIS — Z7982 Long term (current) use of aspirin: Secondary | ICD-10-CM

## 2018-05-15 DIAGNOSIS — M069 Rheumatoid arthritis, unspecified: Secondary | ICD-10-CM | POA: Diagnosis present

## 2018-05-15 DIAGNOSIS — Z90710 Acquired absence of both cervix and uterus: Secondary | ICD-10-CM

## 2018-05-15 DIAGNOSIS — Z882 Allergy status to sulfonamides status: Secondary | ICD-10-CM

## 2018-05-15 DIAGNOSIS — D509 Iron deficiency anemia, unspecified: Secondary | ICD-10-CM | POA: Diagnosis present

## 2018-05-15 DIAGNOSIS — C9190 Lymphoid leukemia, unspecified not having achieved remission: Secondary | ICD-10-CM | POA: Diagnosis not present

## 2018-05-15 DIAGNOSIS — M67362 Transient synovitis, left knee: Secondary | ICD-10-CM | POA: Diagnosis not present

## 2018-05-15 DIAGNOSIS — Z833 Family history of diabetes mellitus: Secondary | ICD-10-CM | POA: Diagnosis not present

## 2018-05-15 DIAGNOSIS — Z8249 Family history of ischemic heart disease and other diseases of the circulatory system: Secondary | ICD-10-CM | POA: Diagnosis not present

## 2018-05-15 DIAGNOSIS — M1990 Unspecified osteoarthritis, unspecified site: Secondary | ICD-10-CM | POA: Diagnosis present

## 2018-05-15 DIAGNOSIS — M24662 Ankylosis, left knee: Secondary | ICD-10-CM | POA: Diagnosis not present

## 2018-05-15 DIAGNOSIS — Z91040 Latex allergy status: Secondary | ICD-10-CM | POA: Diagnosis not present

## 2018-05-15 DIAGNOSIS — R011 Cardiac murmur, unspecified: Secondary | ICD-10-CM | POA: Diagnosis present

## 2018-05-15 DIAGNOSIS — E119 Type 2 diabetes mellitus without complications: Secondary | ICD-10-CM | POA: Diagnosis not present

## 2018-05-15 DIAGNOSIS — I129 Hypertensive chronic kidney disease with stage 1 through stage 4 chronic kidney disease, or unspecified chronic kidney disease: Secondary | ICD-10-CM | POA: Diagnosis present

## 2018-05-15 DIAGNOSIS — Z471 Aftercare following joint replacement surgery: Secondary | ICD-10-CM | POA: Diagnosis not present

## 2018-05-15 DIAGNOSIS — Z7984 Long term (current) use of oral hypoglycemic drugs: Secondary | ICD-10-CM | POA: Diagnosis not present

## 2018-05-15 DIAGNOSIS — Z7401 Bed confinement status: Secondary | ICD-10-CM | POA: Diagnosis not present

## 2018-05-15 DIAGNOSIS — Z856 Personal history of leukemia: Secondary | ICD-10-CM | POA: Diagnosis not present

## 2018-05-15 DIAGNOSIS — Z800 Family history of malignant neoplasm of digestive organs: Secondary | ICD-10-CM

## 2018-05-15 DIAGNOSIS — Z96659 Presence of unspecified artificial knee joint: Secondary | ICD-10-CM

## 2018-05-15 DIAGNOSIS — M6281 Muscle weakness (generalized): Secondary | ICD-10-CM | POA: Diagnosis not present

## 2018-05-15 DIAGNOSIS — M24669 Ankylosis, unspecified knee: Secondary | ICD-10-CM | POA: Diagnosis not present

## 2018-05-15 DIAGNOSIS — Z87442 Personal history of urinary calculi: Secondary | ICD-10-CM | POA: Diagnosis not present

## 2018-05-15 DIAGNOSIS — M2342 Loose body in knee, left knee: Secondary | ICD-10-CM | POA: Diagnosis not present

## 2018-05-15 DIAGNOSIS — M11262 Other chondrocalcinosis, left knee: Secondary | ICD-10-CM | POA: Diagnosis not present

## 2018-05-15 HISTORY — PX: KNEE ARTHROTOMY: SHX5881

## 2018-05-15 LAB — GLUCOSE, CAPILLARY
GLUCOSE-CAPILLARY: 92 mg/dL (ref 70–99)
GLUCOSE-CAPILLARY: 195 mg/dL — AB (ref 70–99)
Glucose-Capillary: 245 mg/dL — ABNORMAL HIGH (ref 70–99)

## 2018-05-15 SURGERY — ARTHROTOMY, KNEE
Anesthesia: General | Site: Knee | Laterality: Left | Wound class: Clean

## 2018-05-15 MED ORDER — FLEET ENEMA 7-19 GM/118ML RE ENEM: 1.0000 | ENEMA | Freq: Once | RECTAL | Status: DC | PRN

## 2018-05-15 MED ORDER — ONDANSETRON HCL 4 MG/2ML IJ SOLN
4.0000 mg | Freq: Four times a day (QID) | INTRAMUSCULAR | Status: DC | PRN
Start: 2018-05-15 — End: 2018-05-17

## 2018-05-15 MED ORDER — FERROUS SULFATE 325 (65 FE) MG PO TABS
325.0000 mg | ORAL_TABLET | Freq: Every day | ORAL | Status: DC
  Administered 2018-05-16 – 2018-05-17 (×2): 325 mg via ORAL
  Filled 2018-05-15 (×2): qty 1

## 2018-05-15 MED ORDER — FAMOTIDINE 20 MG PO TABS
ORAL_TABLET | ORAL | Status: AC
  Filled 2018-05-15: qty 1

## 2018-05-15 MED ORDER — LINAGLIPTIN 5 MG PO TABS
5.0000 mg | ORAL_TABLET | Freq: Every day | ORAL | Status: DC
  Administered 2018-05-15 – 2018-05-17 (×3): 5 mg via ORAL
  Filled 2018-05-15 (×3): qty 1

## 2018-05-15 MED ORDER — VERAPAMIL HCL ER 180 MG PO TBCR
360.0000 mg | EXTENDED_RELEASE_TABLET | Freq: Every day | ORAL | Status: DC
  Administered 2018-05-16 – 2018-05-17 (×2): 360 mg via ORAL
  Filled 2018-05-15 (×2): qty 2

## 2018-05-15 MED ORDER — BUPIVACAINE HCL 0.25 % IJ SOLN
INTRAMUSCULAR | Status: DC | PRN
  Administered 2018-05-15: 60 mL

## 2018-05-15 MED ORDER — SODIUM CHLORIDE 0.9 % IV SOLN
INTRAVENOUS | Status: DC | PRN
  Administered 2018-05-15: 20 mL

## 2018-05-15 MED ORDER — OXYCODONE HCL 5 MG PO TABS
10.0000 mg | ORAL_TABLET | ORAL | Status: DC | PRN
  Administered 2018-05-17: 10 mg via ORAL
  Filled 2018-05-15: qty 2

## 2018-05-15 MED ORDER — ACETAMINOPHEN 10 MG/ML IV SOLN
INTRAVENOUS | Status: DC | PRN
  Administered 2018-05-15: 1000 mg via INTRAVENOUS

## 2018-05-15 MED ORDER — GABAPENTIN 300 MG PO CAPS
300.0000 mg | ORAL_CAPSULE | Freq: Every day | ORAL | Status: DC
  Administered 2018-05-15 – 2018-05-16 (×2): 300 mg via ORAL
  Filled 2018-05-15 (×2): qty 1

## 2018-05-15 MED ORDER — ONDANSETRON HCL 4 MG/2ML IJ SOLN
INTRAMUSCULAR | Status: AC
  Filled 2018-05-15: qty 2

## 2018-05-15 MED ORDER — FAMOTIDINE 20 MG PO TABS: 20.0000 mg | ORAL_TABLET | Freq: Once | ORAL | Status: DC

## 2018-05-15 MED ORDER — ENOXAPARIN SODIUM 30 MG/0.3ML ~~LOC~~ SOLN
30.0000 mg | Freq: Two times a day (BID) | SUBCUTANEOUS | Status: DC
  Administered 2018-05-16 – 2018-05-17 (×3): 30 mg via SUBCUTANEOUS
  Filled 2018-05-15 (×3): qty 0.3

## 2018-05-15 MED ORDER — CELECOXIB 200 MG PO CAPS
ORAL_CAPSULE | ORAL | Status: AC
  Filled 2018-05-15: qty 2

## 2018-05-15 MED ORDER — PIOGLITAZONE HCL 30 MG PO TABS
45.0000 mg | ORAL_TABLET | Freq: Every day | ORAL | Status: DC
  Administered 2018-05-16 – 2018-05-17 (×2): 45 mg via ORAL
  Filled 2018-05-15 (×2): qty 1

## 2018-05-15 MED ORDER — GLYCOPYRROLATE 0.2 MG/ML IJ SOLN
INTRAMUSCULAR | Status: DC | PRN
  Administered 2018-05-15: 0.2 mg via INTRAVENOUS

## 2018-05-15 MED ORDER — HYDROCHLOROTHIAZIDE 12.5 MG PO CAPS
12.5000 mg | ORAL_CAPSULE | Freq: Every day | ORAL | Status: DC
Start: 2018-05-16 — End: 2018-05-17
  Administered 2018-05-16: 12.5 mg via ORAL
  Filled 2018-05-15 (×2): qty 1

## 2018-05-15 MED ORDER — BENAZEPRIL HCL 20 MG PO TABS
20.0000 mg | ORAL_TABLET | Freq: Every day | ORAL | Status: DC
  Administered 2018-05-16: 20 mg via ORAL
  Filled 2018-05-15 (×2): qty 1

## 2018-05-15 MED ORDER — CEFAZOLIN SODIUM-DEXTROSE 2-4 GM/100ML-% IV SOLN
INTRAVENOUS | Status: AC
  Filled 2018-05-15: qty 100

## 2018-05-15 MED ORDER — SENNOSIDES-DOCUSATE SODIUM 8.6-50 MG PO TABS
1.0000 | ORAL_TABLET | Freq: Two times a day (BID) | ORAL | Status: DC
  Administered 2018-05-15 – 2018-05-17 (×4): 1 via ORAL
  Filled 2018-05-15 (×4): qty 1

## 2018-05-15 MED ORDER — PROPOFOL 10 MG/ML IV BOLUS
INTRAVENOUS | Status: DC | PRN
  Administered 2018-05-15: 150 mg via INTRAVENOUS

## 2018-05-15 MED ORDER — DEXAMETHASONE SODIUM PHOSPHATE 10 MG/ML IJ SOLN
INTRAMUSCULAR | Status: DC | PRN
  Administered 2018-05-15: 5 mg via INTRAVENOUS

## 2018-05-15 MED ORDER — SODIUM CHLORIDE 0.9 % IV SOLN
INTRAVENOUS | Status: DC
  Administered 2018-05-15: 15:00:00 via INTRAVENOUS

## 2018-05-15 MED ORDER — CEFAZOLIN SODIUM-DEXTROSE 2-4 GM/100ML-% IV SOLN: 2.0000 g | INTRAVENOUS | Status: DC

## 2018-05-15 MED ORDER — FENTANYL CITRATE (PF) 100 MCG/2ML IJ SOLN
INTRAMUSCULAR | Status: AC
Start: 2018-05-15 — End: ?
  Filled 2018-05-15: qty 2

## 2018-05-15 MED ORDER — BISACODYL 10 MG RE SUPP
10.0000 mg | Freq: Every day | RECTAL | Status: DC | PRN
  Administered 2018-05-17: 10 mg via RECTAL
  Filled 2018-05-15: qty 1

## 2018-05-15 MED ORDER — DEXAMETHASONE SODIUM PHOSPHATE 10 MG/ML IJ SOLN: 8.0000 mg | Freq: Once | INTRAMUSCULAR | Status: DC

## 2018-05-15 MED ORDER — METOCLOPRAMIDE HCL 10 MG PO TABS: 5.0000 mg | ORAL_TABLET | Freq: Three times a day (TID) | ORAL | Status: DC | PRN

## 2018-05-15 MED ORDER — METOCLOPRAMIDE HCL 5 MG/ML IJ SOLN: 5.0000 mg | Freq: Three times a day (TID) | INTRAMUSCULAR | Status: DC | PRN

## 2018-05-15 MED ORDER — ACETAMINOPHEN 10 MG/ML IV SOLN
1000.0000 mg | Freq: Four times a day (QID) | INTRAVENOUS | Status: AC
  Administered 2018-05-16 (×3): 1000 mg via INTRAVENOUS
  Filled 2018-05-15 (×4): qty 100

## 2018-05-15 MED ORDER — BENAZEPRIL-HYDROCHLOROTHIAZIDE 20-12.5 MG PO TABS: 1.0000 | ORAL_TABLET | Freq: Every day | ORAL | Status: DC

## 2018-05-15 MED ORDER — AMLODIPINE BESYLATE 5 MG PO TABS
2.5000 mg | ORAL_TABLET | Freq: Every evening | ORAL | Status: DC
  Administered 2018-05-15: 2.5 mg via ORAL
  Filled 2018-05-15 (×2): qty 1

## 2018-05-15 MED ORDER — ALUM & MAG HYDROXIDE-SIMETH 200-200-20 MG/5ML PO SUSP: 30.0000 mL | ORAL | Status: DC | PRN

## 2018-05-15 MED ORDER — VITAMIN D 1000 UNITS PO TABS
5000.0000 [IU] | ORAL_TABLET | Freq: Every day | ORAL | Status: DC
  Administered 2018-05-15 – 2018-05-17 (×3): 5000 [IU] via ORAL
  Filled 2018-05-15 (×3): qty 5

## 2018-05-15 MED ORDER — DIPHENHYDRAMINE HCL 12.5 MG/5ML PO ELIX: 12.5000 mg | ORAL_SOLUTION | ORAL | Status: DC | PRN

## 2018-05-15 MED ORDER — DIPHENHYDRAMINE HCL 25 MG PO CAPS: 25.0000 mg | ORAL_CAPSULE | Freq: Every evening | ORAL | Status: DC | PRN

## 2018-05-15 MED ORDER — CEFAZOLIN SODIUM-DEXTROSE 2-3 GM-%(50ML) IV SOLR
INTRAVENOUS | Status: DC | PRN
  Administered 2018-05-15: 2 g via INTRAVENOUS

## 2018-05-15 MED ORDER — DEXAMETHASONE SODIUM PHOSPHATE 10 MG/ML IJ SOLN
INTRAMUSCULAR | Status: AC
  Administered 2018-05-15: 8 mg via INTRAVENOUS
  Filled 2018-05-15: qty 1

## 2018-05-15 MED ORDER — PANTOPRAZOLE SODIUM 40 MG PO TBEC
40.0000 mg | DELAYED_RELEASE_TABLET | Freq: Two times a day (BID) | ORAL | Status: DC
  Administered 2018-05-15 – 2018-05-17 (×4): 40 mg via ORAL
  Filled 2018-05-15 (×4): qty 1

## 2018-05-15 MED ORDER — ACETAMINOPHEN 10 MG/ML IV SOLN
INTRAVENOUS | Status: AC
  Filled 2018-05-15: qty 100

## 2018-05-15 MED ORDER — CELECOXIB 200 MG PO CAPS
400.0000 mg | ORAL_CAPSULE | Freq: Once | ORAL | Status: AC
  Administered 2018-05-15: 400 mg via ORAL

## 2018-05-15 MED ORDER — ROCURONIUM BROMIDE 100 MG/10ML IV SOLN
INTRAVENOUS | Status: DC | PRN
  Administered 2018-05-15: 30 mg via INTRAVENOUS
  Administered 2018-05-15 (×2): 20 mg via INTRAVENOUS

## 2018-05-15 MED ORDER — ROCURONIUM BROMIDE 50 MG/5ML IV SOLN
INTRAVENOUS | Status: AC
  Filled 2018-05-15: qty 1

## 2018-05-15 MED ORDER — GABAPENTIN 300 MG PO CAPS: 300.0000 mg | ORAL_CAPSULE | Freq: Once | ORAL | Status: DC

## 2018-05-15 MED ORDER — FENTANYL CITRATE (PF) 100 MCG/2ML IJ SOLN
INTRAMUSCULAR | Status: DC | PRN
  Administered 2018-05-15: 50 ug via INTRAVENOUS
  Administered 2018-05-15: 100 ug via INTRAVENOUS
  Administered 2018-05-15: 50 ug via INTRAVENOUS

## 2018-05-15 MED ORDER — DIPHENHYDRAMINE-APAP (SLEEP) 25-500 MG PO TABS: 1.0000 | ORAL_TABLET | Freq: Every evening | ORAL | Status: DC | PRN

## 2018-05-15 MED ORDER — MAGNESIUM HYDROXIDE 400 MG/5ML PO SUSP
30.0000 mL | Freq: Every day | ORAL | Status: DC
  Administered 2018-05-16 – 2018-05-17 (×2): 30 mL via ORAL
  Filled 2018-05-15 (×2): qty 30

## 2018-05-15 MED ORDER — CEFAZOLIN SODIUM-DEXTROSE 2-4 GM/100ML-% IV SOLN
2.0000 g | Freq: Four times a day (QID) | INTRAVENOUS | Status: AC
  Administered 2018-05-15 – 2018-05-16 (×4): 2 g via INTRAVENOUS
  Filled 2018-05-15 (×5): qty 100

## 2018-05-15 MED ORDER — DEXAMETHASONE SODIUM PHOSPHATE 10 MG/ML IJ SOLN
INTRAMUSCULAR | Status: AC
  Filled 2018-05-15: qty 1

## 2018-05-15 MED ORDER — FENTANYL CITRATE (PF) 100 MCG/2ML IJ SOLN: 25.0000 ug | INTRAMUSCULAR | Status: DC | PRN

## 2018-05-15 MED ORDER — FENTANYL CITRATE (PF) 100 MCG/2ML IJ SOLN
INTRAMUSCULAR | Status: AC
  Filled 2018-05-15: qty 2

## 2018-05-15 MED ORDER — SODIUM CHLORIDE 0.9 % IV SOLN
INTRAVENOUS | Status: DC | PRN
  Administered 2018-05-15 (×2): via INTRAVENOUS

## 2018-05-15 MED ORDER — HYDROMORPHONE HCL 1 MG/ML IJ SOLN: 0.5000 mg | INTRAMUSCULAR | Status: DC | PRN

## 2018-05-15 MED ORDER — ONDANSETRON HCL 4 MG/2ML IJ SOLN: 4.0000 mg | Freq: Once | INTRAMUSCULAR | Status: DC | PRN

## 2018-05-15 MED ORDER — SODIUM CHLORIDE 0.9 % IJ SOLN
INTRAMUSCULAR | Status: DC | PRN
  Administered 2018-05-15: 40 mL via INTRAVENOUS

## 2018-05-15 MED ORDER — GLIMEPIRIDE 2 MG PO TABS
4.0000 mg | ORAL_TABLET | Freq: Two times a day (BID) | ORAL | Status: DC
  Administered 2018-05-16: 4 mg via ORAL
  Filled 2018-05-15 (×3): qty 2
  Filled 2018-05-15: qty 1

## 2018-05-15 MED ORDER — GABAPENTIN 300 MG PO CAPS
ORAL_CAPSULE | ORAL | Status: AC
  Administered 2018-05-15: 300 mg
  Filled 2018-05-15: qty 1

## 2018-05-15 MED ORDER — METOCLOPRAMIDE HCL 10 MG PO TABS
10.0000 mg | ORAL_TABLET | Freq: Three times a day (TID) | ORAL | Status: DC
  Administered 2018-05-15 – 2018-05-17 (×6): 10 mg via ORAL
  Filled 2018-05-15 (×6): qty 1

## 2018-05-15 MED ORDER — ONDANSETRON HCL 4 MG/2ML IJ SOLN
INTRAMUSCULAR | Status: DC | PRN
  Administered 2018-05-15: 4 mg via INTRAVENOUS

## 2018-05-15 MED ORDER — INSULIN ASPART 100 UNIT/ML ~~LOC~~ SOLN
0.0000 [IU] | Freq: Three times a day (TID) | SUBCUTANEOUS | Status: DC
  Administered 2018-05-16 (×2): 3 [IU] via SUBCUTANEOUS
  Administered 2018-05-16: 5 [IU] via SUBCUTANEOUS
  Administered 2018-05-17 (×2): 3 [IU] via SUBCUTANEOUS
  Filled 2018-05-15 (×5): qty 1

## 2018-05-15 MED ORDER — PHENOL 1.4 % MT LIQD
1.0000 | OROMUCOSAL | Status: DC | PRN
  Filled 2018-05-15: qty 177

## 2018-05-15 MED ORDER — SODIUM CHLORIDE 0.9 % IV SOLN
INTRAVENOUS | Status: DC
  Administered 2018-05-16: 100 mL/h via INTRAVENOUS

## 2018-05-15 MED ORDER — TRAMADOL HCL 50 MG PO TABS
50.0000 mg | ORAL_TABLET | ORAL | Status: DC | PRN
  Administered 2018-05-16 (×3): 50 mg via ORAL
  Administered 2018-05-17: 100 mg via ORAL
  Administered 2018-05-17: 50 mg via ORAL
  Filled 2018-05-15: qty 1
  Filled 2018-05-15: qty 2
  Filled 2018-05-15 (×3): qty 1

## 2018-05-15 MED ORDER — CHLORHEXIDINE GLUCONATE 4 % EX LIQD: 60.0000 mL | Freq: Once | CUTANEOUS | Status: DC

## 2018-05-15 MED ORDER — ONDANSETRON HCL 4 MG PO TABS: 4.0000 mg | ORAL_TABLET | Freq: Four times a day (QID) | ORAL | Status: DC | PRN

## 2018-05-15 MED ORDER — MENTHOL 3 MG MT LOZG
1.0000 | LOZENGE | OROMUCOSAL | Status: DC | PRN
  Filled 2018-05-15: qty 9

## 2018-05-15 MED ORDER — SUGAMMADEX SODIUM 500 MG/5ML IV SOLN
INTRAVENOUS | Status: DC | PRN
  Administered 2018-05-15: 250 mg via INTRAVENOUS

## 2018-05-15 MED ORDER — CELECOXIB 200 MG PO CAPS
200.0000 mg | ORAL_CAPSULE | Freq: Two times a day (BID) | ORAL | Status: DC
  Administered 2018-05-15 – 2018-05-17 (×4): 200 mg via ORAL
  Filled 2018-05-15 (×4): qty 1

## 2018-05-15 MED ORDER — ACETAMINOPHEN 325 MG PO TABS: 325.0000 mg | ORAL_TABLET | Freq: Four times a day (QID) | ORAL | Status: DC | PRN

## 2018-05-15 MED ORDER — SIMVASTATIN 20 MG PO TABS
10.0000 mg | ORAL_TABLET | Freq: Every day | ORAL | Status: DC
  Administered 2018-05-15 – 2018-05-16 (×2): 10 mg via ORAL
  Filled 2018-05-15 (×2): qty 1

## 2018-05-15 MED ORDER — TRANEXAMIC ACID 1000 MG/10ML IV SOLN
1000.0000 mg | Freq: Once | INTRAVENOUS | Status: AC
  Administered 2018-05-15: 1000 mg via INTRAVENOUS
  Filled 2018-05-15: qty 1100

## 2018-05-15 MED ORDER — OXYCODONE HCL 5 MG PO TABS
5.0000 mg | ORAL_TABLET | ORAL | Status: DC | PRN
  Administered 2018-05-15 – 2018-05-17 (×5): 5 mg via ORAL
  Filled 2018-05-15 (×5): qty 1

## 2018-05-15 SURGICAL SUPPLY — 56 items
CANISTER SUCT 1200ML W/VALVE (MISCELLANEOUS) ×2 IMPLANT
CANISTER SUCT 3000ML PPV (MISCELLANEOUS) ×6 IMPLANT
COOLER POLAR GLACIER W/PUMP (MISCELLANEOUS) ×2 IMPLANT
CUFF TOURN 24 STER (MISCELLANEOUS) IMPLANT
CUFF TOURN 30 STER DUAL PORT (MISCELLANEOUS) ×2 IMPLANT
DRAPE SHEET LG 3/4 BI-LAMINATE (DRAPES) ×2 IMPLANT
DRSG DERMACEA 8X12 NADH (GAUZE/BANDAGES/DRESSINGS) ×2 IMPLANT
DRSG OPSITE POSTOP 4X14 (GAUZE/BANDAGES/DRESSINGS) ×2 IMPLANT
DRSG TEGADERM 4X4.75 (GAUZE/BANDAGES/DRESSINGS) ×2 IMPLANT
DURAPREP 26ML APPLICATOR (WOUND CARE) ×4 IMPLANT
ELECT CAUTERY BLADE 6.4 (BLADE) ×2 IMPLANT
ELECT REM PT RETURN 9FT ADLT (ELECTROSURGICAL) ×2
ELECTRODE REM PT RTRN 9FT ADLT (ELECTROSURGICAL) ×1 IMPLANT
GLOVE BIOGEL M STRL SZ7.5 (GLOVE) ×4 IMPLANT
GLOVE BIOGEL PI IND STRL 9 (GLOVE) ×1 IMPLANT
GLOVE BIOGEL PI INDICATOR 9 (GLOVE) ×1
GLOVE INDICATOR 8.0 STRL GRN (GLOVE) ×2 IMPLANT
GLOVE SURG SYN 9.0  PF PI (GLOVE) ×1
GLOVE SURG SYN 9.0 PF PI (GLOVE) ×1 IMPLANT
GOWN STRL REUS W/ TWL LRG LVL3 (GOWN DISPOSABLE) ×2 IMPLANT
GOWN STRL REUS W/TWL 2XL LVL3 (GOWN DISPOSABLE) ×2 IMPLANT
GOWN STRL REUS W/TWL LRG LVL3 (GOWN DISPOSABLE) ×2
HANDPIECE VERSAJET DEBRIDEMENT (MISCELLANEOUS) ×2 IMPLANT
HEMOVAC 400CC 10FR (MISCELLANEOUS) ×2 IMPLANT
HOLDER FOLEY CATH W/STRAP (MISCELLANEOUS) ×2 IMPLANT
HOOD PEEL AWAY FLYTE STAYCOOL (MISCELLANEOUS) ×4 IMPLANT
KIT TURNOVER KIT A (KITS) ×2 IMPLANT
LABEL OR SOLS (LABEL) ×2 IMPLANT
NDL SAFETY ECLIPSE 18X1.5 (NEEDLE) ×1 IMPLANT
NEEDLE HYPO 18GX1.5 SHARP (NEEDLE) ×1
NEEDLE SPNL 20GX3.5 QUINCKE YW (NEEDLE) ×4 IMPLANT
NS IRRIG 500ML POUR BTL (IV SOLUTION) ×2 IMPLANT
PACK TOTAL KNEE (MISCELLANEOUS) ×2 IMPLANT
PAD WRAPON POLAR KNEE (MISCELLANEOUS) IMPLANT
PAD WRAPON POLOR MULTI XL (MISCELLANEOUS) ×1 IMPLANT
PLATE ROT INSERT 10MM SIZE 2.5 (Plate) ×2 IMPLANT
PULSAVAC PLUS IRRIG FAN TIP (DISPOSABLE) ×2
SOL .9 NS 3000ML IRR  AL (IV SOLUTION) ×2
SOL .9 NS 3000ML IRR UROMATIC (IV SOLUTION) ×2 IMPLANT
SOL PREP PVP 2OZ (MISCELLANEOUS) ×2
SOLUTION PREP PVP 2OZ (MISCELLANEOUS) ×1 IMPLANT
SPONGE DRAIN TRACH 4X4 STRL 2S (GAUZE/BANDAGES/DRESSINGS) ×2 IMPLANT
STAPLER SKIN PROX 35W (STAPLE) ×2 IMPLANT
SUCTION FRAZIER HANDLE 10FR (MISCELLANEOUS) ×1
SUCTION TUBE FRAZIER 10FR DISP (MISCELLANEOUS) ×1 IMPLANT
SUT VIC AB 0 CT1 36 (SUTURE) ×2 IMPLANT
SUT VIC AB 1 CT1 36 (SUTURE) ×4 IMPLANT
SUT VIC AB 2-0 CT2 27 (SUTURE) ×2 IMPLANT
SYR 20CC LL (SYRINGE) ×2 IMPLANT
SYR 30ML LL (SYRINGE) ×4 IMPLANT
TIP FAN IRRIG PULSAVAC PLUS (DISPOSABLE) ×1 IMPLANT
TOWEL OR 17X26 4PK STRL BLUE (TOWEL DISPOSABLE) ×2 IMPLANT
TRAY FOLEY MTR SLVR 16FR STAT (SET/KITS/TRAYS/PACK) ×2 IMPLANT
WRAP-ON POLOR PAD MULTI XL (MISCELLANEOUS) ×1
WRAPON POLAR PAD KNEE (MISCELLANEOUS)
WRAPON POLOR PAD MULTI XL (MISCELLANEOUS) ×1

## 2018-05-15 NOTE — Anesthesia Preprocedure Evaluation (Addendum)
Anesthesia Evaluation  Patient identified by MRN, date of birth, ID band Patient awake    Reviewed: Allergy & Precautions, H&P , NPO status , Patient's Chart, lab work & pertinent test results  History of Anesthesia Complications Negative for: history of anesthetic complications  Airway Mallampati: III  TM Distance: >3 FB Neck ROM: full    Dental  (+) Chipped, Poor Dentition, Missing   Pulmonary neg pulmonary ROS, neg shortness of breath,           Cardiovascular Exercise Tolerance: Poor hypertension, Pt. on medications + Valvular Problems/Murmurs      Neuro/Psych  Headaches, negative psych ROS   GI/Hepatic negative GI ROS, Neg liver ROS,   Endo/Other  diabetes, Type 2  Renal/GU Renal InsufficiencyRenal disease     Musculoskeletal  (+) Arthritis , Osteoarthritis,    Abdominal   Peds negative pediatric ROS (+)  Hematology negative hematology ROS (+) anemia ,   Anesthesia Other Findings Past Medical History: No date: Allergic rhinitis No date: Chronic kidney disease No date: Chronic lymphocytic leukemia (HCC) No date: DA (degenerative arthritis) No date: Diabetes mellitus No date: Headache No date: Heart murmur No date: History of kidney stones No date: Hyperlipidemia No date: Hypertension No date: Hypertriglyceridemia No date: Iron deficiency anemia No date: Other elevated white blood cell count No date: Unspecified deficiency anemia  Reproductive/Obstetrics negative OB ROS                           Anesthesia Physical Anesthesia Plan  ASA: III  Anesthesia Plan: General ETT   Post-op Pain Management:    Induction: Intravenous  PONV Risk Score and Plan: Dexamethasone, Ondansetron, Midazolam and Treatment may vary due to age or medical condition  Airway Management Planned: Oral ETT  Additional Equipment:   Intra-op Plan:   Post-operative Plan: Extubation in  OR  Informed Consent: I have reviewed the patients History and Physical, chart, labs and discussed the procedure including the risks, benefits and alternatives for the proposed anesthesia with the patient or authorized representative who has indicated his/her understanding and acceptance.   Dental advisory given  Plan Discussed with: CRNA and Surgeon  Anesthesia Plan Comments: (Patient consented for risks of anesthesia including but not limited to:  - adverse reactions to medications - damage to teeth, lips or other oral mucosa - sore throat or hoarseness - Damage to heart, brain, lungs or loss of life  Patient voiced understanding.)       Anesthesia Quick Evaluation

## 2018-05-15 NOTE — Discharge Instructions (Signed)
°  Instructions after Total Knee Replacement ° ° Carmelia Tiner P. Christophr Calix, Jr., M.D.    ° Dept. of Orthopaedics & Sports Medicine ° [facility] ° [address] ° [place], [place]  [address] ° Phone: [phone]   Fax: [phone] ° °  °DIET: °• Drink plenty of non-alcoholic fluids. °• Resume your normal diet. Include foods high in fiber. ° °ACTIVITY:  °• You may use crutches or a walker with weight-bearing as tolerated, unless instructed otherwise. °• You may be weaned off of the walker or crutches by your Physical Therapist.  °• Do NOT place pillows under the knee. Anything placed under the knee could limit your ability to straighten the knee.   °• Continue doing gentle exercises. Exercising will reduce the pain and swelling, increase motion, and prevent muscle weakness.   °• Please continue to use the TED compression stockings for 6 weeks. You may remove the stockings at night, but should reapply them in the morning. °• Do not drive or operate any equipment until instructed. ° °WOUND CARE:  °• Continue to use the PolarCare or ice packs periodically to reduce pain and swelling. °• You may bathe or shower after the staples are removed at the first office visit following surgery. ° °MEDICATIONS: °• You may resume your regular medications. °• Please take the pain medication as prescribed on the medication. °• Do not take pain medication on an empty stomach. °• You have been given a prescription for a blood thinner (Lovenox or Coumadin). Please take the medication as instructed. (NOTE: After completing a 2 week course of Lovenox, take one Enteric-coated aspirin once a day. This along with elevation will help reduce the possibility of phlebitis in your operated leg.) °• Do not drive or drink alcoholic beverages when taking pain medications. ° °CALL THE OFFICE FOR: °• Temperature above 101 degrees °• Excessive bleeding or drainage on the dressing. °• Excessive swelling, coldness, or paleness of the toes. °• Persistent  nausea and vomiting. ° °FOLLOW-UP:  °• You should have an appointment to return to the office in 10-14 days after surgery. °• Arrangements have been made for continuation of Physical Therapy (either home therapy or outpatient therapy). °  °

## 2018-05-15 NOTE — Anesthesia Postprocedure Evaluation (Signed)
Anesthesia Post Note  Patient: Tammy [name]  Procedure(s) Performed: KNEE ARTHROTOMY,LYSIS OF ADHESIONS, POLY EXCHANGE (Left Knee)  Patient location during evaluation: PACU Anesthesia Type: General Level of consciousness: awake and alert Pain management: pain level controlled Vital Signs Assessment: post-procedure vital signs reviewed and stable Respiratory status: spontaneous breathing, nonlabored ventilation, respiratory function stable and patient connected to nasal cannula oxygen Cardiovascular status: blood pressure returned to baseline and stable Postop Assessment: no apparent nausea or vomiting Anesthetic complications: no     Last Vitals:  Vitals:   05/15/18 2006 05/15/18 2021  BP: (!) 147/85 (!) 115/48  Pulse: 77 76  Resp: 20 18  Temp:  37.2 C  SpO2: 96% 96%    Last Pain:  Vitals:   05/15/18 2021  TempSrc:   PainSc: 0-No pain                 Precious Haws Doryce Mcgregory

## 2018-05-15 NOTE — OR Nursing (Signed)
Call to lab @ 1755 spoke with [facility] to let him know stat gram stain is being walked down to lab. Dr Marry Guan request a call back

## 2018-05-15 NOTE — Anesthesia Procedure Notes (Signed)
Procedure Name: Intubation Date/Time: 05/15/2018 4:04 PM Performed by: Jonna Clark, CRNA Pre-anesthesia Checklist: Patient identified, Patient being monitored, Timeout performed, Emergency Drugs available and Suction available Patient Re-evaluated:Patient Re-evaluated prior to induction Oxygen Delivery Method: Circle system utilized Preoxygenation: Pre-oxygenation with 100% oxygen Induction Type: IV induction Ventilation: Mask ventilation without difficulty Laryngoscope Size: 3 and McGraph Grade View: Grade II Tube type: Oral Tube size: 7.0 mm Number of attempts: 2 Airway Equipment and Method: Stylet Placement Confirmation: ETT inserted through vocal cords under direct vision,  positive ETCO2 and breath sounds checked- equal and bilateral Secured at: 21 cm Tube secured with: Tape Dental Injury: Teeth and Oropharynx as per pre-operative assessment  Difficulty Due To: Difficulty was unanticipated and Difficult Airway- due to anterior larynx Comments: Pt more anterior than expected. Not a good view with Mac 3 so switched to [employer]. Good view. Recommend using a number 4 blade next time

## 2018-05-15 NOTE — H&P (Signed)
The patient has been re-examined, and the chart reviewed, and there have been no interval changes to the documented history and physical.    The risks, benefits, and alternatives have been discussed at length. The patient expressed understanding of the risks benefits and agreed with plans for surgical intervention.  James P. Hooten, Jr. M.D.    

## 2018-05-15 NOTE — Anesthesia Post-op Follow-up Note (Signed)
Anesthesia QCDR form completed.        

## 2018-05-15 NOTE — OR Nursing (Signed)
Dr. Marry Guan is aware of patients listed latex allergy. He requested latex gloves.

## 2018-05-15 NOTE — Op Note (Signed)
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 M.D.  ASSISTANT:  Vance Peper, PA (present and scrubbed throughout the case, critical for assistance with exposure, retraction, instrumentation, and closure)  ANESTHESIA: spinal  ESTIMATED BLOOD LOSS: 50 mL  FLUIDS REPLACED: 1100 mL of crystalloid  TOURNIQUET TIME: 90 minutes  DRAINS: 2 medium Hemovac drains  IMPLANTS UTILIZED: DePuy PFC Sigma 10 mm stabilized rotating platform polyethylene insert.  INDICATIONS FOR SURGERY:[name] is a 72 y.o. year old female who is status post a remote left total knee arthroplasty.  She has noticed progressive decrease in her knee range of motion and has had significant start up stiffness.  The patient had not seen any significant improvement despite conservative nonsurgical intervention. After discussion of the risks and benefits of surgical intervention, the patient expressed understanding of the risks benefits and agree with plans for  left knee arthrotomy, extensive synovectomy, debridement/lysis of adhesions, and polyethylene exchange.   The risks, benefits, and alternatives were discussed at length including but not limited to the risks of infection, bleeding, nerve injury, stiffness, blood clots, the need for revision surgery, cardiopulmonary complications, among others, and they were willing to proceed.  PROCEDURE IN DETAIL: The patient was brought into the operating room and, after adequate spinal anesthesia was achieved, a tourniquet was placed on the patient's upper thigh. The patient's knee and leg were cleaned and prepped with alcohol and  DuraPrep and draped in the usual sterile fashion. A "timeout" was performed as per usual protocol. The lower extremity was exsanguinated using an Esmarch, and the tourniquet was inflated to 300 mmHg. An anterior longitudinal incision was made in line with the previous surgical scar followed by a standard medial parapatellar approach. The deep fibers of the medial collateral ligament were elevated in a subperiosteal fashion off of the medial flare of the tibia so as to maintain a continuous soft tissue sleeve. The patella was subluxed laterally.  Dense fibrotic tissue was encountered in the suprapatellar pouch and extended into both the medial and lateral gutters.    A sample of the fibrotic tissue was submitted for pathology as well as stat Gram stain and cultures.  Dense fibrotic tissue was also encountered in the infrapatellar region.  The fibrotic masses were sharply excised in a systematic fashion thus reestablishing the suprapatellar pouch and the medial and lateral gutters. Next, the polyethylene insert was removed without difficulty.  Dense fibrotic tissue was also encountered in the posterior recess.The fibrotic tissue in the posterior recess was then sharply excised. Next, a curette was used to debride tissue around the bone cement implant interfaces.  No evidence of loosening or osteolysis was encountered.  Both the femoral and tibial components were felt to be stable.  The patellar opponent was also noted to be stable.  The soft tissue was then debrided using the Versa Jet.  Tourniquet was deflated after total tourniquet time of 88minutes. Hemostasis was achieved using electrocautery. The knee was irrigated with copious amounts of normal saline with antibiotic solution using pulsatile lavage and then suctioned dry. 20 mL of 1.3% Exparel and 60 mL of 0.25% Marcaine in 40 mL of normal saline was injected along the posterior capsule,  medial and lateral gutters, and along the arthrotomy site. A 10 mm  stabilized rotating platform polyethylene insert was inserted and the knee was placed through a range of motion with excellent mediolateral soft tissue balancing appreciated and excellent patellar tracking noted. 2 medium drains were placed in the wound bed and brought out through separate stab incisions. The medial parapatellar portion of the incision was reapproximated using interrupted sutures of #1 Vicryl. Subcutaneous tissue was approximated in layers using first #0 Vicryl followed #2-0 Vicryl. The skin was approximated with skin staples. A sterile dressing was applied.  The patient tolerated the procedure well and was transported to the recovery room in stable condition.    James P. Holley Bouche., M.D.

## 2018-05-15 NOTE — Transfer of Care (Signed)
Immediate Anesthesia Transfer of Care Note  Patient: Tammy [name]  Procedure(s) Performed: KNEE ARTHROTOMY,LYSIS OF ADHESIONS, POLY EXCHANGE (Left Knee)  Patient Location: PACU  Anesthesia Type:General  Level of Consciousness: sedated and patient cooperative  Airway & Oxygen Therapy: Patient Spontanous Breathing and Patient connected to face mask oxygen  Post-op Assessment: Report given to RN and Post -op Vital signs reviewed and stable  Post vital signs: Reviewed and stable  Last Vitals:  Vitals Value Taken Time  BP 128/46 05/15/2018  7:37 PM  Temp 37.1 C 05/15/2018  7:36 PM  Pulse 76 05/15/2018  7:40 PM  Resp 24 05/15/2018  7:40 PM  SpO2 100 % 05/15/2018  7:40 PM  Vitals shown include unvalidated device data.  Last Pain:  Vitals:   05/15/18 1936  TempSrc:   PainSc: (P) Asleep         Complications: No apparent anesthesia complications

## 2018-05-16 ENCOUNTER — Other Ambulatory Visit: Payer: Self-pay

## 2018-05-16 ENCOUNTER — Encounter
Admission: RE | Admit: 2018-05-16 | Discharge: 2018-05-16 | Disposition: A | Discharge status: Home / Self Care | Payer: Medicare HMO | Source: Ambulatory Visit | Attending: Internal Medicine | Admitting: Internal Medicine

## 2018-05-16 ENCOUNTER — Encounter: Payer: Self-pay | Admitting: Orthopedic Surgery

## 2018-05-16 LAB — GLUCOSE, CAPILLARY
GLUCOSE-CAPILLARY: 194 mg/dL — AB (ref 70–99)
GLUCOSE-CAPILLARY: 172 mg/dL — AB (ref 70–99)
GLUCOSE-CAPILLARY: 195 mg/dL — AB (ref 70–99)
Glucose-Capillary: 218 mg/dL — ABNORMAL HIGH (ref 70–99)

## 2018-05-16 MED ORDER — TRAMADOL HCL 50 MG PO TABS: 50.0000 mg | ORAL_TABLET | ORAL | 0 refills | Status: DC | PRN

## 2018-05-16 MED ORDER — GLIMEPIRIDE 2 MG PO TABS
4.0000 mg | ORAL_TABLET | Freq: Two times a day (BID) | ORAL | Status: DC
  Administered 2018-05-17: 4 mg via ORAL
  Filled 2018-05-16 (×2): qty 1

## 2018-05-16 MED ORDER — ENOXAPARIN SODIUM 30 MG/0.3ML ~~LOC~~ SOLN: 30.0000 mg | Freq: Two times a day (BID) | SUBCUTANEOUS | 0 refills | Status: DC

## 2018-05-16 MED ORDER — OXYCODONE HCL 5 MG PO TABS: 5.0000 mg | ORAL_TABLET | ORAL | 0 refills | Status: DC | PRN

## 2018-05-16 NOTE — Clinical Social Work Note (Signed)
Clinical Social Work Assessment  Patient Details  Name: Tammy [name] MRN: [id number] Date of Birth: 06/03/1946  Date of referral:  05/16/18               Reason for consult:  Facility Placement                Permission sought to share information with:  Chartered certified accountant granted to share information::  Yes, Verbal Permission Granted  Name::      [place]::   [place]   Relationship::     Contact Information:     Housing/Transportation Living arrangements for the past 2 months:  [place] of Information:  Patient Patient Interpreter Needed:  None Criminal Activity/Legal Involvement Pertinent to Current Situation/Hospitalization:  No - Comment as needed Significant Relationships:  Spouse Lives with:  Spouse Do you feel safe going back to the place where you live?  Yes Need for family participation in patient care:  Yes (Comment)  Care giving concerns:  Patient lives in [place] with her husband Tammy [name].    Social Worker assessment / plan:  Holiday representative (CSW) received SNF consult. PT is recommending SNF. CSW met with patient alone at bedside to discuss D/C plan. Patient was alert and oriented X4 and was laying in the bed. CSW introduced self and explained role of CSW department. Patient reported that she lives in [place] with her husband and prefers to go to [facility]. Per patient she called [facility] prior to surgery to secure a bed. CSW explained that [facility] will have to approve SNF. Patient verbalized her understanding. FL2 complete and faxed out.   CSW presented bed offers and patient chose [facility]. Per [facility] admissions coordinator at [facility] she will start [facility] authorization today. CSW will continue to follow and assist as needed.   Employment status:  Retired Nurse, adult PT Recommendations:  [place] / Referral  to community resources:  [place]  Patient/Family's Response to care:  Patient chose [facility].   Patient/Family's Understanding of and Emotional Response to Diagnosis, Current Treatment, and Prognosis:  Patient was very pleasant and thanked CSW for assistance.   Emotional Assessment Appearance:  Appears stated age Attitude/Demeanor/Rapport:    Affect (typically observed):  Accepting, Adaptable, Pleasant Orientation:  Oriented to Self, Oriented to Place, Oriented to  Time, Oriented to Situation Alcohol / Substance use:  Not Applicable Psych involvement (Current and /or in the community):  No (Comment)  Discharge Needs  Concerns to be addressed:  Discharge Planning Concerns Readmission within the last 30 days:  No Current discharge risk:  Dependent with Mobility Barriers to Discharge:  Continued Medical Work up   [employer], Veronia Beets, LCSW 05/16/2018, 2:31 PM

## 2018-05-16 NOTE — Evaluation (Signed)
Physical Therapy Evaluation Patient Details Name: Tammy [name] MRN: [id number] DOB: Sep 28, 1946 Today's Date: 05/16/2018   History of Present Illness  Pt is a 72 y.o. female s/p L knee arthrotomy, extensive synovectomy, debridement of adhesions, and polyethylene exchange 05/15/18.  PMH includes heart murmur, htn, DM type 2, HA's OA, CKD, chronic lymphocytic leukemia, anemia.  Clinical Impression  Prior to hospital admission, pt was independent with ambulation.  Pt lives with her husband in 1 level home with 2 steps to enter with R railing.  Currently pt is min assist supine to sit; mod assist to stand from bed; and CGA to ambulate 8 feet with RW.  Pt's L knee pain 6/10 beginning of session but decreased to 4/10 end of session resting in chair.  L knee ROM extension 15 degrees short of neutral to 70 degrees flexion.  With repetition and cueing pt able to perform L LE SLR on own with increased effort.  Pt would benefit from skilled PT to address noted impairments and functional limitations (see below for any additional details).  Upon hospital discharge, currently recommend pt discharge to [facility].    Follow Up Recommendations SNF    Equipment Recommendations  Rolling walker with 5" wheels;3in1 (PT)    Recommendations for Other Services OT consult     Precautions / Restrictions Precautions Precautions: Knee;Fall Precaution Booklet Issued: Yes (comment) Restrictions Weight Bearing Restrictions: Yes LLE Weight Bearing: Weight bearing as tolerated      Mobility  Bed Mobility Overal bed mobility: Needs Assistance Bed Mobility: Supine to Sit     Supine to sit: Min assist;HOB elevated     General bed mobility comments: assist for L LE; increased effort and time for pt to perform  Transfers Overall transfer level: Needs assistance Equipment used: Rolling walker (2 wheeled) Transfers: Sit to/from Stand Sit to Stand: Mod assist         General transfer comment: vc's for UE and LE  placement; assist to initiate and come to full stand  Ambulation/Gait Ambulation/Gait assistance: Min guard Gait Distance (Feet): 8 Feet Assistive device: Rolling walker (2 wheeled)   Gait velocity: decreased   General Gait Details: decreased stance time L LE; step to gait pattern  Stairs            Wheelchair Mobility    Modified Rankin (Stroke Patients Only)       Balance Overall balance assessment: Needs assistance Sitting-balance support: No upper extremity supported;Feet supported Sitting balance-Leahy Scale: Good Sitting balance - Comments: steady sitting reaching within BOS   Standing balance support: Single extremity supported Standing balance-Leahy Scale: Poor Standing balance comment: pt requires at least single UE support for static standing balance                             Pertinent Vitals/Pain Pain Assessment: 0-10 Pain Score: 4 (6/10 beginning of session; 4/10 end of session) Pain Location: L knee Pain Descriptors / Indicators: Aching;Sore;Guarding;Grimacing Pain Intervention(s): Limited activity within patient's tolerance;Monitored during session;Premedicated before session;Repositioned;Other (comment)(polar care applied and activated)    Home Living Family/patient expects to be discharged to:: Private residence Living Arrangements: Spouse/significant other Available Help at Discharge: Family Type of Home: House Home Access: Stairs to enter Entrance Stairs-Rails: Right Entrance Stairs-Number of Steps: 2 Home Layout: One [place]: [place] - 4 wheels;Bedside commode;Grab bars - tub/shower      Prior Function Level of Independence: Independent  Comments: 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     Hand Dominance        Extremity/Trunk Assessment   Upper Extremity Assessment Upper Extremity Assessment: Defer to OT evaluation;Overall WFL for tasks assessed    Lower Extremity Assessment Lower Extremity  Assessment: RLE deficits/detail;LLE deficits/detail RLE Deficits / Details: strength and ROM WFL LLE Deficits / Details: hip flexion at least 3/5; DF at least 3/5 AROM LLE: Unable to fully assess due to pain    Cervical / Trunk Assessment Cervical / Trunk Assessment: Normal  Communication   Communication: No difficulties  Cognition Arousal/Alertness: Awake/alert Behavior During Therapy: WFL for tasks assessed/performed Overall Cognitive Status: Within Functional Limits for tasks assessed                                        General Comments General comments (skin integrity, edema, etc.): No drainage noted; hemovac in place.  Pt agreeable to PT session.    Exercises Total Joint Exercises Ankle Circles/Pumps: AROM;Strengthening;Both;10 reps;Supine Quad Sets: AROM;Strengthening;Left;10 reps;Supine Short Arc Quad: AAROM;Strengthening;Left;10 reps;Supine Heel Slides: AAROM;Strengthening;Left;10 reps;Supine Hip ABduction/ADduction: AAROM;Strengthening;Left;10 reps;Supine Straight Leg Raises: AAROM;AROM;Strengthening;Left;10 reps;Supine Goniometric ROM: L knee extension AROM 15 degrees short of neutral semi-supine in bed; L knee flexion AAROM 70 degrees sitting ege of recliner   Assessment/Plan    PT Assessment Patient needs continued PT services  PT Problem List Decreased strength;Decreased range of motion;Decreased activity tolerance;Decreased balance;Decreased mobility;Decreased knowledge of use of DME;Decreased knowledge of precautions;Pain       PT Treatment Interventions DME instruction;Gait training;Stair training;Functional mobility training;Therapeutic activities;Therapeutic exercise;Balance training;Patient/family education    PT Goals (Current goals can be found in the Care Plan section)  Acute Rehab PT Goals Patient Stated Goal: to go to rehab PT Goal Formulation: With patient Time For Goal Achievement: 05/30/18 Potential to Achieve Goals: Fair     Frequency BID   Barriers to discharge Decreased caregiver support      Co-evaluation               AM-PAC PT "6 Clicks" Daily Activity  Outcome Measure Difficulty turning over in bed (including adjusting bedclothes, sheets and blankets)?: A Little Difficulty moving from lying on back to sitting on the side of the bed? : Unable Difficulty sitting down on and standing up from a chair with arms (e.g., wheelchair, bedside commode, etc,.)?: Unable Help needed moving to and from a bed to chair (including a wheelchair)?: A Lot Help needed walking in hospital room?: A Little Help needed climbing 3-5 steps with a railing? : A Lot 6 Click Score: 12    End of Session Equipment Utilized During Treatment: Gait belt Activity Tolerance: Patient limited by pain Patient left: in chair;with call bell/phone within reach;with chair alarm set;with family/visitor present;with SCD's reapplied;Other (comment)(B heels elevated via towel rolls; polar care in place and activated) Nurse Communication: Mobility status;Precautions;Weight bearing status;Other (comment)(Pt's pain status) PT Visit Diagnosis: Other abnormalities of gait and mobility (R26.89);Muscle weakness (generalized) (M62.81);Difficulty in walking, not elsewhere classified (R26.2);Pain Pain - Right/Left: Left Pain - part of body: Knee    Time: 1122-1200 PT Time Calculation (min) (ACUTE ONLY): 38 min   Charges:   PT Evaluation $PT Eval Low Complexity: 1 Low PT Treatments $Therapeutic Exercise: 8-22 mins $Therapeutic Activity: 8-22 mins   PT G CodesLeitha Bleak, PT 05/16/18, 12:37 PM [phone]

## 2018-05-16 NOTE — Discharge Summary (Addendum)
Physician Discharge Summary  Patient ID: Tammy [name] MRN: [id number] DOB/AGE: 01/14/1946 72 y.o.  Admit date: 05/15/2018 Discharge date: 05/17/2018  Admission Diagnoses:  arthrofibrosis of knee joint, status post total left knee replacement   Discharge Diagnoses: Patient Active Problem List   Diagnosis Date Noted  . CLL (chronic lymphocytic leukemia) ([place]) 06/03/2015  . Iron deficiency anemia 06/03/2015  . S/P total knee arthroplasty 03/26/2014    Past Medical History:  Diagnosis Date  . Allergic rhinitis   . Chronic kidney disease   . Chronic lymphocytic leukemia ([place])   . DA (degenerative arthritis)   . Diabetes mellitus   . Headache   . Heart murmur   . History of kidney stones   . Hyperlipidemia   . Hypertension   . Hypertriglyceridemia   . Iron deficiency anemia   . Other elevated white blood cell count   . Unspecified deficiency anemia      Transfusion: No transfusions during this admission   Consultants (if any):   Discharged Condition: Improved  Hospital Course: Tammy [name] is an 72 y.o. 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  Patient was having difficultly with urination and subsequence sent to rehab with foley for 24 hours along with bladder training   Surgeries:Procedure(s): [place], POLY EXCHANGE on 05/15/2018  PRE-OPERATIVE DIAGNOSIS:Arthrofibrosis of the left knee status post left total knee arthroplasty  POST-OPERATIVE DIAGNOSIS: Same  PROCEDURE: Left knee arthrotomy, extensive synovectomy, debridement of adhesions, and polyethylene exchange  SURGEON: Dereck Leep, Jr. M.D.  ASSISTANT: Vance Peper, PA (present and scrubbed throughout the case, critical for assistance with exposure, retraction, instrumentation, and  closure)  ANESTHESIA:spinal  ESTIMATED BLOOD LOSS:25mL  FLUIDS REPLACED:1159mL of crystalloid  TOURNIQUET TIME:46minutes  DRAINS: 2 medium Hemovac drains  IMPLANTS UTILIZED: DePuy PFC Sigma40mm stabilized rotating platform polyethylene insert.  INDICATIONS FOR SURGERY:Tammy [name] is a 72 y.o. year old femalewho is status post a remote left total knee arthroplasty. She has noticed progressive decrease in her knee range of motion and has had significant start up stiffness. The patient had not seen any significant improvement despite conservative nonsurgical intervention. After discussion of the risks and benefits of surgical intervention, the patient expressed understanding of the risks benefits and agree with plans for left knee arthrotomy, extensive synovectomy, debridement/lysis of adhesions, and polyethylene exchange.   The risks, benefits, and alternatives were discussed at length including but not limited to the risks of infection, bleeding, nerve injury, stiffness, blood clots, the need for revision surgery, cardiopulmonary complications, among others, and they were willing to proceed.   Patient tolerated the surgery well. No complications .Patient was taken to PACU where she was stabilized and then transferred to the orthopedic floor.  Patient started on Lovenox 30 mg q 12 hrs. Foot pumps applied bilaterally at 80 mm hgb. Heels elevated off bed with rolled towels. No evidence of DVT. Calves non tender. Negative Homan. Physical therapy started on day #1 for gait training and transfer with OT starting on  day #1 for ADL and assisted devices. Patient has done well with therapy. Ambulated 40 feet upon being discharged.  Patient's IV And Foley were discontinued on day #1 with Hemovac being discontinued on day #2. Dressing was changed on day 2 prior to patient being discharged   She was given perioperative antibiotics:  Anti-infectives (From  admission, onward)    Start     Dose/Rate Route Frequency Ordered Stop   05/15/18 2200  ceFAZolin (ANCEF) IVPB 2g/100 mL premix     2 g 200 mL/hr over 30 Minutes Intravenous Every 6 hours 05/15/18 2055 05/16/18 2159   05/15/18 1349  ceFAZolin (ANCEF) 2-4 GM/100ML-% IVPB    Note to Pharmacy:  Lyman Bishop   : cabinet override      05/15/18 1349 05/16/18 0159   05/15/18 0600  ceFAZolin (ANCEF) IVPB 2g/100 mL premix  Status:  Discontinued     2 g 200 mL/hr over 30 Minutes Intravenous On call to O.R. 05/15/18 0102 05/15/18 1356    .  She was fitted with AV 1 compression foot pump devices, instructed on heel pumps, early ambulation, and fitted with TED stockings bilaterally for DVT prophylaxis.  She benefited maximally from the hospital stay and there were no complications.    Recent vital signs:  Vitals:   05/16/18 0429 05/16/18 0737  BP: (!) 119/45 (!) 125/51  Pulse: 72 71  Resp: 18 18  Temp: 98.1 F (36.7 C) 98.7 F (37.1 C)  SpO2: 94% 96%    Recent laboratory studies:  Lab Results  Component Value Date   HGB 11.3 (L) 05/03/2018   HGB 11.1 (L) 12/12/2017   HGB 11.3 (L) 10/07/2017   Lab Results  Component Value Date   WBC 26.6 (H) 05/03/2018   PLT 207 05/03/2018   Lab Results  Component Value Date   INR 1.06 05/03/2018   Lab Results  Component Value Date   NA 139 05/03/2018   K 3.6 05/03/2018   CL 104 05/03/2018   CO2 26 05/03/2018   BUN 29 (H) 05/03/2018   CREATININE 1.30 (H) 05/03/2018   GLUCOSE 193 (H) 05/03/2018    Discharge Medications:   Allergies as of 05/16/2018      Reactions   Latex Rash   Sulfa Antibiotics Rash      Medication List    STOP taking these medications   aspirin 81 MG tablet     TAKE these medications   acetaminophen 500 MG tablet Commonly known as:  TYLENOL Take 1,000 mg by mouth every 6 (six) hours as needed for moderate pain or headache.   amLODipine 2.5 MG tablet Commonly known as:  NORVASC Take 2.5 mg by mouth every evening.    benazepril-hydrochlorthiazide 20-12.5 MG tablet Commonly known as:  LOTENSIN HCT Take 1 tablet by mouth daily.   Cholecalciferol 5000 units capsule Take 5,000 Units by mouth daily.   diphenhydramine-acetaminophen 25-500 MG Tabs tablet Commonly known as:  TYLENOL PM Take 1 tablet by mouth at bedtime as needed.   enoxaparin 30 MG/0.3ML injection Commonly known as:  LOVENOX Inject 0.3 mLs (30 mg total) into the skin every 12 (twelve) hours. Start taking on:  05/18/2018   ferrous sulfate 325 (65 FE) MG tablet Take 325 mg by mouth daily with breakfast.   glimepiride 4 MG tablet Commonly known as:  AMARYL Take 4 mg by mouth 2 (two) times daily.   MULTIVITAMIN PO Take 1 tablet by mouth daily.   oxyCODONE 5 MG immediate release tablet Commonly known as:  Oxy IR/ROXICODONE Take 1 tablet (5 mg total) by mouth every 4 (four) hours as needed for moderate pain (pain score 4-6).   pioglitazone 45 MG tablet Commonly known as:  ACTOS Take 45 mg by mouth daily.   simvastatin 10 MG tablet Commonly known as:  ZOCOR Take 10 mg by  mouth at bedtime.   sitaGLIPtin 50 MG tablet Commonly known as:  JANUVIA Take 50 mg by mouth daily.   traMADol 50 MG tablet Commonly known as:  ULTRAM Take 1-2 tablets (50-100 mg total) by mouth every 4 (four) hours as needed for moderate pain. Start taking on:  05/18/2018   verapamil 180 MG CR tablet Commonly known as:  CALAN-SR Take 360 mg by mouth daily.            Durable Medical Equipment  (From admission, onward)        Start     Ordered   05/15/18 2056  DME Walker rolling  Once    Question:  Patient needs a walker to treat with the following condition  Answer:  Total knee replacement status   05/15/18 2055   05/15/18 2056  DME Bedside commode  Once    Question:  Patient needs a bedside commode to treat with the following condition  Answer:  Total knee replacement status   05/15/18 2055      Diagnostic Studies: Dg Knee Left  Port  Result Date: 05/15/2018 CLINICAL DATA:  Left total knee arthroplasty EXAM: PORTABLE LEFT KNEE - 1-2 VIEW COMPARISON:  Left knee radiograph 01/02/2017 FINDINGS: Well seated components of left total knee arthroplasty. There is associated soft tissue gas, surgical drain and overlying skin staples. No immediate abnormality is evident. IMPRESSION: Normal postoperative appearance of left total knee arthroplasty. Electronically Signed   By: Ulyses Jarred M.D.   On: 05/15/2018 20:16    Disposition:   Discharge Instructions    Increase activity slowly   Complete by:  As directed       Follow-up Information    Watt Climes, PA On 05/30/2018.   Specialty:  Physician Assistant Why:  at 1:15pm Contact information: [place] [address] [phone]        Dereck Leep, MD On 06/27/2018.   Specialty:  Orthopedic Surgery Why:  at 10:00am Contact information: [place] [address] [phone]            Signed: Watt Climes 05/16/2018, 7:55 AM

## 2018-05-16 NOTE — Evaluation (Signed)
Occupational Therapy Evaluation Patient Details Name: Tammy [name] MRN: [id number] DOB: 07-04-1946 Today's Date: 05/16/2018    History of Present Illness Pt is a 72 y.o. female s/p L knee arthrotomy, extensive synovectomy, debridement of adhesions, and polyethylene exchange 05/15/18.  PMH includes heart murmur, htn, DM type 2, HA's OA, CKD, chronic lymphocytic leukemia, anemia.   Clinical Impression   Pt seen for OT evaluation this date, POD#1 from above surgery. Pt was independent in all ADLs prior to surgery, however becoming more limited due to L knee pain. Pt is eager to return to PLOF with less pain and improved safety and independence. Pt currently requires minimal to moderate assist for LB dressing and bathing while in seated position due to pain and limited AROM of L knee. Pt instructed in polar care mgt, falls prevention strategies, home/routines modifications, DME/AE for LB bathing and dressing tasks, and compression stocking mgt. Pt would benefit from skilled OT services including additional instruction in dressing techniques with or without assistive devices for dressing and bathing skills to support recall and carryover prior to discharge and ultimately to maximize safety, independence, and minimize falls risk and caregiver burden. Recommend STR at this time.       Follow Up Recommendations  SNF    Equipment Recommendations  Other (comment)(reacher)    Recommendations for Other Services       Precautions / Restrictions Precautions Precautions: Knee;Fall Precaution Booklet Issued: Yes (comment) Restrictions Weight Bearing Restrictions: Yes LLE Weight Bearing: Weight bearing as tolerated      Mobility Bed Mobility      General bed mobility comments: deferred, up in recliner  Transfers       General transfer comment: deferred, pt politely declined secondary to fatigue, recently received pain meds    Balance Overall balance assessment: Needs  assistance Sitting-balance support: No upper extremity supported;Feet supported Sitting balance-Leahy Scale: Good Sitting balance - Comments: steady sitting reaching within BOS                              ADL either performed or assessed with clinical judgement   ADL Overall ADL's : Needs assistance/impaired Eating/Feeding: Independent;Sitting   Grooming: Standing;Min guard   Upper Body Bathing: Sitting;Supervision/ safety   Lower Body Bathing: Sit to/from stand;Moderate assistance   Upper Body Dressing : Sitting;Supervision/safety   Lower Body Dressing: Sit to/from stand;Moderate assistance Lower Body Dressing Details (indicate cue type and reason): pt instructed in use of AE for LB dressing tasks to maximize safety and independence with dressing Toilet Transfer: RW;Minimal assistance;Moderate assistance;Ambulation;Comfort height toilet Toilet Transfer Details (indicate cue type and reason): instructed in use of BSC at home over toilet if needed         Functional mobility during ADLs: Min guard;Rolling walker       Vision Baseline Vision/History: Wears glasses Wears Glasses: Reading only Patient Visual Report: No change from baseline       Perception     Praxis      Pertinent Vitals/Pain Pain Assessment: 0-10 Pain Score: 3  Pain Location: L knee Pain Descriptors / Indicators: Aching;Sore;Guarding;Grimacing Pain Intervention(s): Limited activity within patient's tolerance;Monitored during session;Premedicated before session;Ice applied     Hand Dominance     Extremity/Trunk Assessment Upper Extremity Assessment Upper Extremity Assessment: Overall WFL for tasks assessed   Lower Extremity Assessment Lower Extremity Assessment: Defer to PT evaluation RLE Deficits / Details: strength and ROM WFL LLE Deficits / Details: hip  flexion at least 3/5; DF at least 3/5 AROM LLE: Unable to fully assess due to pain   Cervical / Trunk Assessment Cervical /  Trunk Assessment: Normal   Communication Communication Communication: No difficulties   Cognition Arousal/Alertness: Awake/alert Behavior During Therapy: WFL for tasks assessed/performed Overall Cognitive Status: Within Functional Limits for tasks assessed                                     General Comments      Exercises Other Exercises Other Exercises: pt instructed in compression stocking mgt, falls prevention strategies, and polar care mgt   Shoulder Instructions      Home Living Family/patient expects to be discharged to:: Private residence Living Arrangements: Spouse/significant other Available Help at Discharge: Available PRN/intermittently;Family(spouse works 3rd and sometimes 2nd shift) Type of Home: House Home Access: Stairs to enter [employer] of Steps: 2 Entrance Stairs-Rails: Right Home Layout: One level     Bathroom Shower/Tub: Occupational psychologist: Handicapped height     Home Equipment: Environmental consultant - 4 wheels;Bedside commode;Grab bars - tub/shower;Shower seat - built in          Prior Functioning/Environment Level of Independence: Independent        Comments: 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 Is a Charity fundraiser         OT Problem List: Decreased strength;Decreased knowledge of use of DME or AE;Pain;Decreased activity tolerance;Obesity;Decreased range of motion      OT Treatment/Interventions: Self-care/ADL training;Balance training;Therapeutic exercise;Therapeutic activities;DME and/or AE instruction;Patient/family education    OT Goals(Current goals can be found in the care plan section) Acute Rehab OT Goals Patient Stated Goal: to go to rehab OT Goal Formulation: With patient Time For Goal Achievement: 05/30/18 Potential to Achieve Goals: Good ADL Goals Pt Will Perform Lower Body Dressing: with supervision;sit to/from stand;with adaptive equipment Pt Will Transfer to Toilet: with  supervision;ambulating(LRAD for amb, elevated commode) Additional ADL Goal #1: Pt will independently instruct spouse in compression stocking mgt and polar care mgt to maximize safety and adherence.  OT Frequency: Min 1X/week   Barriers to D/C: Decreased caregiver support          Co-evaluation              AM-PAC PT "6 Clicks" Daily Activity     Outcome Measure Help from another person eating meals?: None Help from another person taking care of personal grooming?: A Little Help from another person toileting, which includes using toliet, bedpan, or urinal?: A Lot Help from another person bathing (including washing, rinsing, drying)?: A Lot Help from another person to put on and taking off regular upper body clothing?: None Help from another person to put on and taking off regular lower body clothing?: A Lot 6 Click Score: 17   End of Session    Activity Tolerance: Patient limited by fatigue Patient left: in chair;with call bell/phone within reach;with chair alarm set;Other (comment)(polar care in place)  OT Visit Diagnosis: Other abnormalities of gait and mobility (R26.89);Pain Pain - Right/Left: Left Pain - part of body: Knee                Time: 3419-3790 OT Time Calculation (min): 12 min Charges:  OT General Charges $OT Visit: 1 Visit OT Evaluation $OT Eval Low Complexity: 1 Low  Jeni Salles, MPH, MS, OTR/L ascom [phone] 05/16/18, 1:56 PM

## 2018-05-16 NOTE — Clinical Social Work Placement (Signed)
   CLINICAL SOCIAL WORK PLACEMENT  NOTE  Date:  05/16/2018  Patient Details  Name: Tammy [name] MRN: [id number] Date of Birth: 11/28/1945  Clinical Social Work is seeking post-discharge placement for this patient at the [place] level of care (*CSW will initial, date and re-position this form in  chart as items are completed):  Yes   Patient/family provided with [facility] Work Department's list of facilities offering this level of care within the geographic area requested by the patient (or if unable, by the patient's family).  Yes   Patient/family informed of their freedom to choose among providers that offer the needed level of care, that participate in Medicare, Medicaid or managed care program needed by the patient, have an available bed and are willing to accept the patient.  Yes   Patient/family informed of [facility]'s ownership interest in [facility] and [facility], as well as of the fact that they are under no obligation to receive care at these facilities.  PASRR submitted to EDS on       PASRR number received on       Existing PASRR number confirmed on 05/16/18     FL2 transmitted to all facilities in geographic area requested by pt/family on 05/16/18     FL2 transmitted to all facilities within larger geographic area on       Patient informed that his/her managed care company has contracts with or will negotiate with certain facilities, including the following:        Yes   Patient/family informed of bed offers received.  Patient chooses bed at [facility] )     Physician recommends and patient chooses bed at      Patient to be transferred to   on  .  Patient to be transferred to facility by       Patient family notified on   of transfer.  Name of family member notified:        PHYSICIAN       Additional Comment:    _______________________________________________ Wayde Gopaul, Veronia Beets, LCSW 05/16/2018, 2:29  PM

## 2018-05-16 NOTE — Progress Notes (Signed)
Physical Therapy Treatment Patient Details Name: Tammy [name] MRN: [id number] DOB: [birth year] Today's Date: 05/16/2018    History of Present Illness Pt is a 72 y.o. female s/p L knee arthrotomy, extensive synovectomy, debridement of adhesions, and polyethylene exchange 05/15/18.  PMH includes heart murmur, htn, DM type 2, HA's OA, CKD, chronic lymphocytic leukemia, anemia.    PT Comments    Pt in recliner, ready to return to bed.  Stood with min/mod a x 1.  Once standing pt reported dizziness 5/10 which did not clear with time.  She felt comfortable transferring to bed and was able with min a x 1.  Upon sitting, pt c/o nausea.  She was assisted to supine with min assist for LE management.  Positioned for comfort but declined further activity.   Follow Up Recommendations  SNF     Equipment Recommendations  Rolling walker with 5" wheels;3in1 (PT)    Recommendations for Other Services OT consult     Precautions / Restrictions Precautions Precautions: Knee;Fall Precaution Booklet Issued: Yes (comment) Restrictions Weight Bearing Restrictions: Yes LLE Weight Bearing: Weight bearing as tolerated    Mobility  Bed Mobility Overal bed mobility: Needs Assistance Bed Mobility: Sit to Supine     Supine to sit: Min assist;HOB elevated Sit to supine: Min assist   General bed mobility comments: for LE management  Transfers Overall transfer level: Needs assistance Equipment used: Rolling walker (2 wheeled) Transfers: Sit to/from Stand Sit to Stand: Min assist;Mod assist         General transfer comment: deferred, pt politely declined secondary to fatigue, recently received pain meds  Ambulation/Gait Ambulation/Gait assistance: Min guard Gait Distance (Feet): 3 Feet Assistive device: Rolling walker (2 wheeled) Gait Pattern/deviations: Step-to pattern;Decreased step length - right;Decreased step length - left Gait velocity: decreased Gait velocity interpretation: <1.31  ft/sec, indicative of household ambulator General Gait Details: decreased stance time L LE; step to gait pattern   Stairs             Wheelchair Mobility    Modified Rankin (Stroke Patients Only)       Balance Overall balance assessment: Needs assistance Sitting-balance support: No upper extremity supported;Feet supported Sitting balance-Leahy Scale: Good Sitting balance - Comments: steady sitting reaching within BOS   Standing balance support: Bilateral upper extremity supported Standing balance-Leahy Scale: Poor Standing balance comment: pt requires at least single UE support for static standing balance                            Cognition Arousal/Alertness: Awake/alert Behavior During Therapy: WFL for tasks assessed/performed Overall Cognitive Status: Within Functional Limits for tasks assessed                                        Exercises Total Joint Exercises Ankle Circles/Pumps: AROM;Strengthening;Both;10 reps;Supine Quad Sets: AROM;Strengthening;Left;10 reps;Supine Short Arc Quad: AAROM;Strengthening;Left;10 reps;Supine Heel Slides: AAROM;Strengthening;Left;10 reps;Supine Hip ABduction/ADduction: AAROM;Strengthening;Left;10 reps;Supine Straight Leg Raises: AAROM;AROM;Strengthening;Left;10 reps;Supine Goniometric ROM: L knee extension AROM 15 degrees short of neutral semi-supine in bed; L knee flexion AAROM 70 degrees sitting ege of recliner Other Exercises Other Exercises: pt instructed in compression stocking mgt, falls prevention strategies, and polar care mgt    General Comments General comments (skin integrity, edema, etc.): No drainage noted; hemovac in place      Pertinent Vitals/Pain Pain Assessment: 0-10 Pain Score:  6  Pain Location: L knee Pain Descriptors / Indicators: Aching;Sore;Guarding;Grimacing Pain Intervention(s): Limited activity within patient's tolerance;Monitored during session;Ice applied    Home  Living Family/patient expects to be discharged to:: Private residence Living Arrangements: Spouse/significant other Available Help at Discharge: Available PRN/intermittently;Family(spouse works 3rd and sometimes 2nd shift) Type of Home: House Home Access: Stairs to enter Entrance Stairs-Rails: Right Home Layout: One level Home Equipment: Environmental consultant - 4 wheels;Bedside commode;Grab bars - tub/shower;Shower seat - built in      Prior Function Level of Independence: Independent      Comments: 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 Is a Charity fundraiser    PT Goals (current goals can now be found in the care plan section) Acute Rehab PT Goals Patient Stated Goal: to go to rehab PT Goal Formulation: With patient Time For Goal Achievement: 05/30/18 Potential to Achieve Goals: Fair Progress towards PT goals: Progressing toward goals    Frequency    BID      PT Plan Current plan remains appropriate    Co-evaluation              AM-PAC PT "6 Clicks" Daily Activity  Outcome Measure  Difficulty turning over in bed (including adjusting bedclothes, sheets and blankets)?: A Little Difficulty moving from lying on back to sitting on the side of the bed? : Unable Difficulty sitting down on and standing up from a chair with arms (e.g., wheelchair, bedside commode, etc,.)?: Unable Help needed moving to and from a bed to chair (including a wheelchair)?: A Little Help needed walking in hospital room?: A Little Help needed climbing 3-5 steps with a railing? : A Lot 6 Click Score: 13    End of Session Equipment Utilized During Treatment: Gait belt Activity Tolerance: Patient limited by pain;Other (comment) Patient left: in bed;with bed alarm set;with call bell/phone within reach;with SCD's reapplied Nurse Communication: Mobility status;Precautions;Weight bearing status;Other (comment)(Pt's pain status) PT Visit Diagnosis: Other abnormalities of gait and mobility (R26.89);Muscle weakness  (generalized) (M62.81);Difficulty in walking, not elsewhere classified (R26.2);Pain Pain - Right/Left: Left Pain - part of body: Knee     Time: 1405-1420 PT Time Calculation (min) (ACUTE ONLY): 15 min  Charges:  $Gait Training: 8-22 mins $Therapeutic Exercise: 8-22 mins $Therapeutic Activity: 8-22 mins                    G Codes:       Chesley Noon, PTA 05/16/18, 3:59 PM

## 2018-05-16 NOTE — Progress Notes (Signed)
OT Cancellation Note  Patient Details Name: Tammy [name] MRN: [id number] DOB: 12-17-1945   Cancelled Treatment:    Reason Eval/Treat Not Completed: Pain limiting ability to participate. Order received, chart reviewed. Upon attempt to evaluate, pt reporting significant pain, RN aware and bringing pain meds per pt report. Pt declines this am 2:2 significant pain. Agreeable to OT re-attempt in afternoon.   Jeni Salles, MPH, MS, OTR/L ascom [phone] 05/16/18, 11:13 AM

## 2018-05-16 NOTE — NC FL2 (Signed)
[place] LEVEL OF CARE SCREENING TOOL     IDENTIFICATION  Patient Name: Tammy [name] Birthdate: 1946/06/01 Sex: female Admission Date (Current Location): 05/15/2018  [place] and [place] Number:  Engineering geologist and Address:  [facility], [address] [place], [place] [address]      Provider Number: [id number]  Attending Physician Name and Address:  Dereck Leep, MD  Relative Name and Phone Number:       Current Level of Care: Hospital Recommended Level of Care: [place] Prior Approval Number:    Date Approved/Denied:   PASRR Number: [id number])  Discharge Plan: SNF    Current Diagnoses: Patient Active Problem List   Diagnosis Date Noted  . CLL (chronic lymphocytic leukemia) ([place]) 06/03/2015  . Iron deficiency anemia 06/03/2015  . S/P total knee arthroplasty 03/26/2014    Orientation RESPIRATION BLADDER Height & Weight     Self, Time, Situation, Place  Normal Continent Weight:   Height:     BEHAVIORAL SYMPTOMS/MOOD NEUROLOGICAL BOWEL NUTRITION STATUS      Continent Diet(Diet: Carb Modified. )  AMBULATORY STATUS COMMUNICATION OF NEEDS Skin   Extensive Assist Verbally Surgical wounds(Incision: Left Knee )                       Personal Care Assistance Level of Assistance  Bathing, Feeding, Dressing Bathing Assistance: Limited assistance Feeding assistance: Independent Dressing Assistance: Limited assistance     Functional Limitations Info  Sight, Hearing, Speech Sight Info: Adequate Hearing Info: Adequate Speech Info: Adequate    SPECIAL CARE FACTORS FREQUENCY  PT (By licensed PT), OT (By licensed OT)     PT Frequency: (5) OT Frequency: (5)            Contractures      Additional Factors Info  Code Status, Allergies Code Status Info: (Full Code. ) Allergies Info: (Latex, Sulfa Antibiotics)           Current Medications (05/16/2018):  This is the current hospital  active medication list Current Facility-Administered Medications  Medication Dose Route Frequency Provider Last Rate Last Dose  . 0.9 %  sodium chloride infusion   Intravenous Continuous Hooten, Laurice Record, MD 100 mL/hr at 05/16/18 0444 100 mL/hr at 05/16/18 0444  . acetaminophen (OFIRMEV) IV 1,000 mg  1,000 mg Intravenous Q6H Hooten, Laurice Record, MD   Stopped at 05/16/18 [phone]  . acetaminophen (TYLENOL) tablet 325-650 mg  325-650 mg Oral Q6H PRN Hooten, Laurice Record, MD      . alum & mag hydroxide-simeth (MAALOX/MYLANTA) 200-200-20 MG/5ML suspension 30 mL  30 mL Oral Q4H PRN Hooten, Laurice Record, MD      . amLODipine (NORVASC) tablet 2.5 mg  2.5 mg Oral QPM Hooten, Laurice Record, MD   2.5 mg at 05/15/18 2139  . benazepril (LOTENSIN) tablet 20 mg  20 mg Oral Daily Hallaji, Sheema M, RPH       And  . hydrochlorothiazide (MICROZIDE) capsule 12.5 mg  12.5 mg Oral Daily Hallaji, Sheema M, RPH      . bisacodyl (DULCOLAX) suppository 10 mg  10 mg Rectal Daily PRN Hooten, Laurice Record, MD      . ceFAZolin (ANCEF) IVPB 2g/100 mL premix  2 g Intravenous Q6H Hooten, Laurice Record, MD   Stopped at 05/16/18 [phone]  . celecoxib (CELEBREX) capsule 200 mg  200 mg Oral BID Dereck Leep, MD   200 mg at 05/15/18 2140  . cholecalciferol (  VITAMIN D) tablet 5,000 Units  5,000 Units Oral Daily Dereck Leep, MD   5,000 Units at 05/15/18 2138  . diphenhydrAMINE (BENADRYL) 12.5 MG/5ML elixir 12.5-25 mg  12.5-25 mg Oral Q4H PRN Hooten, Laurice Record, MD      . diphenhydrAMINE (BENADRYL) capsule 25 mg  25 mg Oral QHS PRN Hallaji, Sheema M, RPH      . enoxaparin (LOVENOX) injection 30 mg  30 mg Subcutaneous Q12H Hooten, Laurice Record, MD      . ferrous sulfate tablet 325 mg  325 mg Oral Q breakfast Hooten, Laurice Record, MD      . gabapentin (NEURONTIN) capsule 300 mg  300 mg Oral QHS Hooten, Laurice Record, MD   300 mg at 05/15/18 2140  . glimepiride (AMARYL) tablet 4 mg  4 mg Oral BID Hooten, Laurice Record, MD      . HYDROmorphone (DILAUDID) injection 0.5-1 mg  0.5-1 mg Intravenous  Q4H PRN Hooten, Laurice Record, MD      . insulin aspart (novoLOG) injection 0-15 Units  0-15 Units Subcutaneous TID WC Hooten, Laurice Record, MD      . linagliptin (TRADJENTA) tablet 5 mg  5 mg Oral Daily Hooten, Laurice Record, MD   5 mg at 05/15/18 2215  . magnesium hydroxide (MILK OF MAGNESIA) suspension 30 mL  30 mL Oral Daily Hooten, Laurice Record, MD      . menthol-cetylpyridinium (CEPACOL) lozenge 3 mg  1 lozenge Oral PRN Hooten, Laurice Record, MD       Or  . phenol (CHLORASEPTIC) mouth spray 1 spray  1 spray Mouth/Throat PRN Hooten, Laurice Record, MD      . metoCLOPramide (REGLAN) tablet 5-10 mg  5-10 mg Oral Q8H PRN Hooten, Laurice Record, MD       Or  . metoCLOPramide (REGLAN) injection 5-10 mg  5-10 mg Intravenous Q8H PRN Hooten, Laurice Record, MD      . metoCLOPramide (REGLAN) tablet 10 mg  10 mg Oral TID AC & HS Hooten, Laurice Record, MD   10 mg at 05/15/18 2140  . ondansetron (ZOFRAN) tablet 4 mg  4 mg Oral Q6H PRN Hooten, Laurice Record, MD       Or  . ondansetron (ZOFRAN) injection 4 mg  4 mg Intravenous Q6H PRN Hooten, Laurice Record, MD      . oxyCODONE (Oxy IR/ROXICODONE) immediate release tablet 10 mg  10 mg Oral Q4H PRN Hooten, Laurice Record, MD      . oxyCODONE (Oxy IR/ROXICODONE) immediate release tablet 5 mg  5 mg Oral Q4H PRN Dereck Leep, MD   5 mg at 05/15/18 2139  . pantoprazole (PROTONIX) EC tablet 40 mg  40 mg Oral BID Dereck Leep, MD   40 mg at 05/15/18 2139  . pioglitazone (ACTOS) tablet 45 mg  45 mg Oral Daily Hooten, Laurice Record, MD      . senna-docusate (Senokot-S) tablet 1 tablet  1 tablet Oral BID Dereck Leep, MD   1 tablet at 05/15/18 2139  . simvastatin (ZOCOR) tablet 10 mg  10 mg Oral QHS Hooten, Laurice Record, MD   10 mg at 05/15/18 2141  . sodium phosphate (FLEET) 7-19 GM/118ML enema 1 enema  1 enema Rectal Once PRN Hooten, Laurice Record, MD      . traMADol Veatrice Bourbon) tablet 50-100 mg  50-100 mg Oral Q4H PRN Dereck Leep, MD   50 mg at 05/16/18 6767  . verapamil (CALAN-SR) CR tablet 360 mg  360 mg Oral  Daily Hooten, Laurice Record, MD          Discharge Medications: Please see discharge summary for a list of discharge medications.  Relevant Imaging Results:  Relevant Lab Results:   Additional Information (SSN: [id number])  Layton Naves, Veronia Beets, LCSW

## 2018-05-16 NOTE — Progress Notes (Signed)
   Subjective: 1 Day Post-Op Procedure(s) (LRB): KNEE ARTHROTOMY,LYSIS OF ADHESIONS, POLY EXCHANGE (Left) Patient reports pain as 5 on 0-10 scale.   Doing very well this morning.  Slept most of the night. Patient is well, and has had no acute complaints or problems We will start therapy today.  Plan is to go Rehab after hospital stay. no nausea and no vomiting Patient denies any chest pains or shortness of breath. Objective: Vital signs in last 24 hours: Temp:  [97 F (36.1 C)-98.9 F (37.2 C)] 98.7 F (37.1 C) (06/27 0737) Pulse Rate:  [71-81] 71 (06/27 0737) Resp:  [14-20] 18 (06/27 0737) BP: (109-147)/(38-85) 125/51 (06/27 0737) SpO2:  [91 %-100 %] 96 % (06/27 0737) Heels are non tender and elevated off the bed using rolled towels Intake/Output from previous day: 06/26 0701 - 06/27 0700 In: 3250 [P.O.:720; I.V.:2230; IV Piggyback:300] Out: 1035 [Urine:925; Drains:60; Blood:50] Intake/Output this shift: No intake/output data recorded.  No results for input(s): HGB in the last 72 hours. No results for input(s): WBC, RBC, HCT, PLT in the last 72 hours. No results for input(s): NA, K, CL, CO2, BUN, CREATININE, GLUCOSE, CALCIUM in the last 72 hours. No results for input(s): LABPT, INR in the last 72 hours.  EXAM General - Patient is Alert, Appropriate and Oriented Extremity - Neurologically intact Neurovascular intact Sensation intact distally Intact pulses distally Dorsiflexion/Plantar flexion intact Compartment soft Dressing - dressing C/D/I Motor Function - intact, moving foot and toes well on exam.    Past Medical History:  Diagnosis Date  . Allergic rhinitis   . Chronic kidney disease   . Chronic lymphocytic leukemia ([place])   . DA (degenerative arthritis)   . Diabetes mellitus   . Headache   . Heart murmur   . History of kidney stones   . Hyperlipidemia   . Hypertension   . Hypertriglyceridemia   . Iron deficiency anemia   . Other elevated white blood cell  count   . Unspecified deficiency anemia     Assessment/Plan: 1 Day Post-Op Procedure(s) (LRB): KNEE ARTHROTOMY,LYSIS OF ADHESIONS, POLY EXCHANGE (Left) Active Problems:   S/P total knee arthroplasty  Estimated body mass index is 39.01 kg/m as calculated from the following:   Height as of 05/03/18: 5\' 10"  (1.778 m).   Weight as of 05/03/18: 123.3 kg (271 lb 14.4 oz). Advance diet Up with therapy D/C IV fluids Plan for discharge tomorrow Discharge to SNF  Labs: None DVT Prophylaxis - Lovenox, Foot Pumps and TED hose Weight-Bearing as tolerated to left leg D/C O2 and Pulse OX and try on Room Air Begin working on bowel movement  Chyane Greer R. [place] 05/16/2018, 7:49 AM

## 2018-05-17 DIAGNOSIS — N189 Chronic kidney disease, unspecified: Secondary | ICD-10-CM | POA: Diagnosis not present

## 2018-05-17 DIAGNOSIS — R2689 Other abnormalities of gait and mobility: Secondary | ICD-10-CM | POA: Diagnosis not present

## 2018-05-17 DIAGNOSIS — N183 Chronic kidney disease, stage 3 (moderate): Secondary | ICD-10-CM | POA: Diagnosis not present

## 2018-05-17 DIAGNOSIS — Z471 Aftercare following joint replacement surgery: Secondary | ICD-10-CM | POA: Diagnosis not present

## 2018-05-17 DIAGNOSIS — Z7401 Bed confinement status: Secondary | ICD-10-CM | POA: Diagnosis not present

## 2018-05-17 DIAGNOSIS — E1122 Type 2 diabetes mellitus with diabetic chronic kidney disease: Secondary | ICD-10-CM | POA: Diagnosis not present

## 2018-05-17 DIAGNOSIS — R29898 Other symptoms and signs involving the musculoskeletal system: Secondary | ICD-10-CM | POA: Diagnosis not present

## 2018-05-17 DIAGNOSIS — Z96652 Presence of left artificial knee joint: Secondary | ICD-10-CM | POA: Diagnosis not present

## 2018-05-17 DIAGNOSIS — C9190 Lymphoid leukemia, unspecified not having achieved remission: Secondary | ICD-10-CM | POA: Diagnosis not present

## 2018-05-17 DIAGNOSIS — D509 Iron deficiency anemia, unspecified: Secondary | ICD-10-CM | POA: Diagnosis not present

## 2018-05-17 DIAGNOSIS — M25569 Pain in unspecified knee: Secondary | ICD-10-CM | POA: Diagnosis not present

## 2018-05-17 DIAGNOSIS — M25662 Stiffness of left knee, not elsewhere classified: Secondary | ICD-10-CM | POA: Diagnosis not present

## 2018-05-17 DIAGNOSIS — M6281 Muscle weakness (generalized): Secondary | ICD-10-CM | POA: Diagnosis not present

## 2018-05-17 DIAGNOSIS — I129 Hypertensive chronic kidney disease with stage 1 through stage 4 chronic kidney disease, or unspecified chronic kidney disease: Secondary | ICD-10-CM | POA: Diagnosis not present

## 2018-05-17 DIAGNOSIS — I10 Essential (primary) hypertension: Secondary | ICD-10-CM | POA: Diagnosis not present

## 2018-05-17 DIAGNOSIS — E119 Type 2 diabetes mellitus without complications: Secondary | ICD-10-CM | POA: Diagnosis not present

## 2018-05-17 LAB — GLUCOSE, CAPILLARY
Glucose-Capillary: 165 mg/dL — ABNORMAL HIGH (ref 70–99)
Glucose-Capillary: 174 mg/dL — ABNORMAL HIGH (ref 70–99)

## 2018-05-17 LAB — SURGICAL PATHOLOGY

## 2018-05-17 NOTE — Progress Notes (Signed)
Physical Therapy Treatment Patient Details Name: Tammy [name] MRN: [id number] DOB: August 09, 1946 Today's Date: 05/17/2018    History of Present Illness Pt is a 72 y.o. female s/p L knee arthrotomy, extensive synovectomy, debridement of adhesions, and polyethylene exchange 05/15/18.  PMH includes heart murmur, htn, DM type 2, HA's OA, CKD, chronic lymphocytic leukemia, anemia.    PT Comments    Pt able to progress to ambulating 40 feet with RW CGA; no L knee buckling noted; steady; distance limited d/t pt reporting feeling sick to her stomach and also L knee pain.   Pain L knee 7/10 beginning of session and 6/10 end of session (nursing present end of session giving pain medications).  Able to increase L knee flexion to 82 degrees today.  Will continue to progress pt with strengthening, L knee ROM, and progressive functional mobility per pt tolerance.   Follow Up Recommendations  SNF     Equipment Recommendations  Rolling walker with 5" wheels;3in1 (PT)    Recommendations for Other Services OT consult     Precautions / Restrictions Precautions Precautions: Knee;Fall Precaution Booklet Issued: Yes (comment) Restrictions Weight Bearing Restrictions: Yes LLE Weight Bearing: Weight bearing as tolerated    Mobility  Bed Mobility Overal bed mobility: Needs Assistance Bed Mobility: Supine to Sit     Supine to sit: Min assist;HOB elevated     General bed mobility comments: assist for L LE management  Transfers Overall transfer level: Needs assistance Equipment used: Rolling walker (2 wheeled) Transfers: Sit to/from Stand Sit to Stand: Min assist;Mod assist         General transfer comment: assist to initiate and come to full stand with RW; vc's for UE and LE placement required  Ambulation/Gait Ambulation/Gait assistance: Min guard Gait Distance (Feet): 40 Feet Assistive device: Rolling walker (2 wheeled)   Gait velocity: decreased   General Gait Details: decreased  stance time L LE; step to gait pattern; antalgic   Stairs             Wheelchair Mobility    Modified Rankin (Stroke Patients Only)       Balance Overall balance assessment: Needs assistance Sitting-balance support: No upper extremity supported;Feet supported Sitting balance-Leahy Scale: Good Sitting balance - Comments: steady sitting reaching within BOS   Standing balance support: Single extremity supported Standing balance-Leahy Scale: Poor Standing balance comment: pt requires at least single UE support for static standing balance                            Cognition Arousal/Alertness: Awake/alert Behavior During Therapy: WFL for tasks assessed/performed Overall Cognitive Status: Within Functional Limits for tasks assessed                                        Exercises Total Joint Exercises Quad Sets: AROM;Strengthening;Left;10 reps;Supine Short Arc Quad: AAROM;Strengthening;Left;10 reps;Supine Heel Slides: AAROM;Strengthening;Left;10 reps;Supine Hip ABduction/ADduction: AAROM;Strengthening;Left;10 reps;Supine Straight Leg Raises: AAROM;Strengthening;Left;10 reps;Supine(very minimal assist for L LE SLR) Goniometric ROM: L knee extension 10 degrees short of neutral semi-supine in bed; L knee flexion 82 degrees sitting edge of recliner    General Comments General comments (skin integrity, edema, etc.): mild red drainage noted in honeycomb dressing.  Nursing cleared pt for participation in physical therapy.  Pt agreeable to PT session.      Pertinent Vitals/Pain Pain Assessment: 0-10 Pain  Score: 7  Pain Location: L knee Pain Descriptors / Indicators: Aching;Sore;Guarding;Grimacing Pain Intervention(s): Limited activity within patient's tolerance;Monitored during session;Premedicated before session;Repositioned;Patient requesting pain meds-RN notified;Other (comment)(polar care applied and activated; nursing present end of session  giving pt pain medications)  Vitals (HR and O2 on room air) stable and WFL throughout treatment session.    Home Living                      Prior Function            PT Goals (current goals can now be found in the care plan section) Acute Rehab PT Goals Patient Stated Goal: to go to rehab PT Goal Formulation: With patient Time For Goal Achievement: 05/30/18 Potential to Achieve Goals: Fair Progress towards PT goals: Progressing toward goals    Frequency    BID      PT Plan Current plan remains appropriate    Co-evaluation              AM-PAC PT "6 Clicks" Daily Activity  Outcome Measure  Difficulty turning over in bed (including adjusting bedclothes, sheets and blankets)?: A Little Difficulty moving from lying on back to sitting on the side of the bed? : Unable Difficulty sitting down on and standing up from a chair with arms (e.g., wheelchair, bedside commode, etc,.)?: Unable Help needed moving to and from a bed to chair (including a wheelchair)?: A Lot Help needed walking in hospital room?: A Little Help needed climbing 3-5 steps with a railing? : A Lot 6 Click Score: 12    End of Session Equipment Utilized During Treatment: Gait belt Activity Tolerance: Patient limited by pain Patient left: in chair;with call bell/phone within reach;with chair alarm set;with nursing/sitter in room;with SCD's reapplied;Other (comment)(B heels elevated via towel rolls; polar care in place and activated) Nurse Communication: Mobility status;Precautions;Weight bearing status;Other (comment)(Pt's pain status) PT Visit Diagnosis: Other abnormalities of gait and mobility (R26.89);Muscle weakness (generalized) (M62.81);Difficulty in walking, not elsewhere classified (R26.2);Pain Pain - Right/Left: Left Pain - part of body: Knee     Time: 2035-5974 PT Time Calculation (min) (ACUTE ONLY): 38 min  Charges:  $Gait Training: 8-22 mins $Therapeutic Exercise: 8-22  mins $Therapeutic Activity: 8-22 mins                    G CodesLeitha Bleak, PT 05/17/18, 10:29 AM [phone]

## 2018-05-17 NOTE — Progress Notes (Signed)
Per [facility] admissions coordinator at [facility] authorization has been received. Patient is aware of above. Patient can D/C to [facility] when medically stable.   [employer], LCSW [phone]

## 2018-05-17 NOTE — Progress Notes (Signed)
   Subjective: 2 Days Post-Op Procedure(s) (LRB): KNEE ARTHROTOMY,LYSIS OF ADHESIONS, POLY EXCHANGE (Left) Patient reports pain as moderate.   Patient is well, and has had no acute complaints or problems Patient did not do well with therapy yesterday. Plan is to go Rehab after hospital stay. no nausea and no vomiting Patient denies any chest pains or shortness of breath. Objective: Vital signs in last 24 hours: Temp:  [97.7 F (36.5 C)-98.9 F (37.2 C)] 97.7 F (36.5 C) (06/28 0719) Pulse Rate:  [67-71] 67 (06/28 0719) Resp:  [18] 18 (06/28 0719) BP: (99-125)/(45-58) 121/45 (06/28 0719) SpO2:  [95 %-98 %] 98 % (06/28 0719) well approximated incision Heels are non tender and elevated off the bed using rolled towels Intake/Output from previous day: 06/27 0701 - 06/28 0700 In: 4200 [P.O.:1800; I.V.:2400] Out: 675 [Urine:675] Intake/Output this shift: No intake/output data recorded.  No results for input(s): HGB in the last 72 hours. No results for input(s): WBC, RBC, HCT, PLT in the last 72 hours. No results for input(s): NA, K, CL, CO2, BUN, CREATININE, GLUCOSE, CALCIUM in the last 72 hours. No results for input(s): LABPT, INR in the last 72 hours.  EXAM General - Patient is Alert, Appropriate and Oriented Extremity - Neurologically intact Neurovascular intact Sensation intact distally Intact pulses distally Dorsiflexion/Plantar flexion intact No cellulitis present Compartment soft Dressing - dressing C/D/I Motor Function - intact, moving foot and toes well on exam.    Past Medical History:  Diagnosis Date  . Allergic rhinitis   . Chronic kidney disease   . Chronic lymphocytic leukemia ([place])   . DA (degenerative arthritis)   . Diabetes mellitus   . Headache   . Heart murmur   . History of kidney stones   . Hyperlipidemia   . Hypertension   . Hypertriglyceridemia   . Iron deficiency anemia   . Other elevated white blood cell count   . Unspecified deficiency  anemia     Assessment/Plan: 2 Days Post-Op Procedure(s) (LRB): KNEE ARTHROTOMY,LYSIS OF ADHESIONS, POLY EXCHANGE (Left) Active Problems:   S/P total knee arthroplasty  Estimated body mass index is 39.01 kg/m as calculated from the following:   Height as of 05/03/18: 5\' 10"  (1.778 m).   Weight as of 05/03/18: 123.3 kg (271 lb 14.4 oz). Up with therapy Discharge to SNF once patient has had a bowel movement and is able to urinate  Labs: None DVT Prophylaxis - Lovenox, Foot Pumps and TED hose Weight-Bearing as tolerated to left leg Patient needs a bowel movement May need to consider catheterization with bladder training  Ramata Strothman R. [place] 05/17/2018, 8:27 AM

## 2018-05-17 NOTE — Progress Notes (Signed)
Patient is medically stable for D/C to [facility] today. Per [facility] admissions coordinator at [facility] authorization has been received and patient can come today to room 205. RN will call report at [phone] and arrange EMS for transport. Clinical Education officer, museum (CSW) sent D/C orders to [employer] [facility] via [employer]. Patient is aware of above. CSW contacted patient's husband [name] and made him aware of above. Please reconsult if future social work needs arise. CSW signing off.   [employer], LCSW [phone]

## 2018-05-17 NOTE — Clinical Social Work Placement (Signed)
   CLINICAL SOCIAL WORK PLACEMENT  NOTE  Date:  05/17/2018  Patient Details  Name: Tammy [name] MRN: [id number] Date of Birth: 1946-08-23  Clinical Social Work is seeking post-discharge placement for this patient at the [place] level of care (*CSW will initial, date and re-position this form in  chart as items are completed):  Yes   Patient/family provided with [facility] Work Department's list of facilities offering this level of care within the geographic area requested by the patient (or if unable, by the patient's family).  Yes   Patient/family informed of their freedom to choose among providers that offer the needed level of care, that participate in Medicare, Medicaid or managed care program needed by the patient, have an available bed and are willing to accept the patient.  Yes   Patient/family informed of [facility]'s ownership interest in [facility] and [facility], as well as of the fact that they are under no obligation to receive care at these facilities.  PASRR submitted to EDS on       PASRR number received on       Existing PASRR number confirmed on 05/16/18     FL2 transmitted to all facilities in geographic area requested by pt/family on 05/16/18     FL2 transmitted to all facilities within larger geographic area on       Patient informed that his/her managed care company has contracts with or will negotiate with certain facilities, including the following:        Yes   Patient/family informed of bed offers received.  Patient chooses bed at [facility] )     Physician recommends and patient chooses bed at      Patient to be transferred to [facility] ) on 05/17/18.  Patient to be transferred to facility by [facility] )     Patient family notified on 05/17/18 of transfer.  Name of family member notified:  (Patient's husband Tammy [name] is aware of D/C today. )     PHYSICIAN       Additional  Comment:    _______________________________________________ Kellyn [place]field, Veronia Beets, LCSW 05/17/2018, 2:06 PM

## 2018-05-17 NOTE — Progress Notes (Signed)
Pt unable to void, bladder scan done and revealed 361ml. Pt encouraged PO fluids and has been drinking and tolerating PO intake well. Bladder scan done again at around 0015 and revealed 346ml. Pt had an in and out catheterization done at 1540 yesterday (05/16/18). On call Dr. Rudene Christians paged and ordered to insert foley catheter, keep for 6 hours and discontinue in the morning. Order placed and will carry out.

## 2018-05-17 NOTE — Progress Notes (Signed)
Pt in no acute distress. Per PA, RN placed foley for urine retention. RN called report to [facility] facility. EMS called to transport the pt. VSS. IV removed.

## 2018-05-20 ENCOUNTER — Other Ambulatory Visit
Admission: RE | Admit: 2018-05-20 | Discharge: 2018-05-20 | Disposition: A | Discharge status: Home / Self Care | Payer: Medicare HMO | Source: Skilled Nursing Facility | Attending: Internal Medicine | Admitting: Internal Medicine

## 2018-05-20 ENCOUNTER — Encounter
Admission: RE | Admit: 2018-05-20 | Discharge: 2018-05-20 | Disposition: A | Discharge status: Home / Self Care | Payer: Medicare HMO | Source: Ambulatory Visit | Attending: Internal Medicine | Admitting: Internal Medicine

## 2018-05-20 DIAGNOSIS — C9190 Lymphoid leukemia, unspecified not having achieved remission: Secondary | ICD-10-CM | POA: Insufficient documentation

## 2018-05-20 DIAGNOSIS — Z96652 Presence of left artificial knee joint: Secondary | ICD-10-CM | POA: Diagnosis not present

## 2018-05-20 LAB — COMPREHENSIVE METABOLIC PANEL
ALT: 7 U/L (ref 0–44)
ANION GAP: 6 (ref 5–15)
AST: 18 U/L (ref 15–41)
Albumin: 3.1 g/dL — ABNORMAL LOW (ref 3.5–5.0)
Alkaline Phosphatase: 61 U/L (ref 38–126)
BILIRUBIN TOTAL: 0.5 mg/dL (ref 0.3–1.2)
BUN: 32 mg/dL — ABNORMAL HIGH (ref 8–23)
CO2: 29 mmol/L (ref 22–32)
Calcium: 8.3 mg/dL — ABNORMAL LOW (ref 8.9–10.3)
Chloride: 106 mmol/L (ref 98–111)
Creatinine, Ser: 1.08 mg/dL — ABNORMAL HIGH (ref 0.44–1.00)
GFR calc Af Amer: 58 mL/min — ABNORMAL LOW (ref >60)
GFR, EST NON AFRICAN AMERICAN: 50 mL/min — AB (ref >60)
Glucose, Bld: 83 mg/dL (ref 70–99)
POTASSIUM: 4.2 mmol/L (ref 3.5–5.1)
Sodium: 141 mmol/L (ref 135–145)
TOTAL PROTEIN: 5.8 g/dL — AB (ref 6.5–8.1)

## 2018-05-20 LAB — CBC WITH DIFFERENTIAL/PLATELET
BASOS PCT: 0 %
Basophils Absolute: 0 10*3/uL (ref 0–0.1)
EOS ABS: 0.4 10*3/uL (ref 0–0.7)
EOS PCT: 1 %
HEMATOCRIT: 29.5 % — AB (ref 35.0–47.0)
Hemoglobin: 9.7 g/dL — ABNORMAL LOW (ref 12.0–16.0)
LYMPHS ABS: 31.9 10*3/uL — AB (ref 1.0–3.6)
Lymphocytes Relative: 85 %
MCH: 31.2 pg (ref 26.0–34.0)
MCHC: 32.7 g/dL (ref 32.0–36.0)
MCV: 95.4 fL (ref 80.0–100.0)
MONO ABS: 0.8 10*3/uL (ref 0.2–0.9)
Monocytes Relative: 2 %
Neutro Abs: 4.5 10*3/uL (ref 1.4–6.5)
Neutrophils Relative %: 12 %
PLATELETS: 215 10*3/uL (ref 150–440)
RBC: 3.1 MIL/uL — AB (ref 3.80–5.20)
RDW: 15.3 % — AB (ref 11.5–14.5)
WBC: 37.6 10*3/uL — AB (ref 3.6–11.0)

## 2018-05-21 LAB — AEROBIC/ANAEROBIC CULTURE W GRAM STAIN (SURGICAL/DEEP WOUND): Gram Stain: NONE SEEN

## 2018-05-21 LAB — AEROBIC/ANAEROBIC CULTURE (SURGICAL/DEEP WOUND): CULTURE: NO GROWTH

## 2018-05-28 ENCOUNTER — Non-Acute Institutional Stay (SKILLED_NURSING_FACILITY): Payer: Medicare HMO | Admitting: Adult Health

## 2018-05-28 ENCOUNTER — Encounter: Payer: Self-pay | Admitting: Adult Health

## 2018-05-28 DIAGNOSIS — Z96652 Presence of left artificial knee joint: Secondary | ICD-10-CM | POA: Diagnosis not present

## 2018-05-28 DIAGNOSIS — I10 Essential (primary) hypertension: Secondary | ICD-10-CM

## 2018-05-28 DIAGNOSIS — E1122 Type 2 diabetes mellitus with diabetic chronic kidney disease: Secondary | ICD-10-CM

## 2018-05-28 DIAGNOSIS — N183 Chronic kidney disease, stage 3 (moderate): Secondary | ICD-10-CM | POA: Diagnosis not present

## 2018-05-28 DIAGNOSIS — D509 Iron deficiency anemia, unspecified: Secondary | ICD-10-CM | POA: Diagnosis not present

## 2018-05-28 DIAGNOSIS — C9110 Chronic lymphocytic leukemia of B-cell type not having achieved remission: Secondary | ICD-10-CM

## 2018-05-28 DIAGNOSIS — C9190 Lymphoid leukemia, unspecified not having achieved remission: Secondary | ICD-10-CM

## 2018-05-28 NOTE — Progress Notes (Signed)
[facility]:  [facility] Number: [place] of Service:  SNF [phone]) Provider:  Durenda Age, NP  Patient Care Team: Wenda Low, MD as PCP - General (Internal Medicine)  Extended Emergency Contact Information Primary Emergency Contact: Tammy [name] Address: [address]          [place], [place] [address] Johnnette Litter of [employer] Phone: [phone] Mobile Phone: [phone] Relation: Spouse  Code Status:  FULL  Goals of care: Advanced Directive information Advanced Directives 05/28/2018  Does Patient Have a Medical Advance Directive? No  Type of Advance Directive -  Does patient want to make changes to medical advance directive? No - Patient declined  Copy of [place] in Chart? -  Would patient like information on creating a medical advance directive? -     Chief Complaint  Patient presents with  . Discharge Note    Discharged from SNF    HPI:  Pt is a 72 y.o. [name] seen today for a discharge visit. She will discharge with outpatient rehabilitation PT and OT.  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 She will follow-up with Dr. Marry Guan, orthopedics, on 05/30/18. She has PMH of CLL (chronic lymphocytic leukemia), CKD stage 3, DM, and iron deficiency anemia.  Patient was admitted to this facility for short-term rehabilitation after the patient's recent hospitalization.  Patient has completed SNF rehabilitation and therapy has cleared the patient for discharge.   Past Medical History:  Diagnosis Date  . Allergic rhinitis   . Chronic kidney disease   . Chronic lymphocytic leukemia ([place])   . DA (degenerative arthritis)   . Diabetes mellitus   . Headache   . Heart murmur   . History of kidney stones   . Hyperlipidemia   . Hypertension   . Hypertriglyceridemia   . Iron deficiency anemia     . Other elevated white blood cell count   . Unspecified deficiency anemia    Past Surgical History:  Procedure Laterality Date  . ABDOMINAL HYSTERECTOMY     50 years  . COLONOSCOPY  2008   most recent  . [place]   lask  . JOINT REPLACEMENT    . JOINT REPLACEMENT    . KNEE ARTHROTOMY Left 05/15/2018   Procedure: KNEE ARTHROTOMY,LYSIS OF ADHESIONS, POLY EXCHANGE;  Surgeon: Dereck Leep, MD;  Location: [facility] ORS;  Service: Orthopedics;  Laterality: Left;  . REFRACTIVE SURGERY Bilateral 1996  . REPLACEMENT TOTAL KNEE Right 2015  . REPLACEMENT TOTAL KNEE Left 2007    Allergies  Allergen Reactions  . Latex Rash  . Sulfa Antibiotics Rash    Outpatient Encounter Medications as of 05/28/2018  Medication Sig  . acetaminophen (TYLENOL) 500 MG tablet Take 1,000 mg by mouth every 6 (six) hours as needed for moderate pain or headache.  Marland Kitchen amLODipine (NORVASC) 2.5 MG tablet Take 2.5 mg by mouth every evening.   . benazepril-hydrochlorthiazide (LOTENSIN HCT) 20-12.5 MG per tablet Take 1 tablet by mouth daily.   . Cholecalciferol 5000 units capsule Take 5,000 Units by mouth daily.   . diphenhydramine-acetaminophen (TYLENOL PM) 25-500 MG TABS tablet Take 1 tablet by mouth at bedtime as needed.   . enoxaparin (LOVENOX) 30 MG/0.3ML injection Inject 0.3 mLs (30 mg total) into the skin every 12 (twelve) hours.  . ferrous sulfate 325 (Tammy FE) MG tablet  Take 325 mg by mouth daily with breakfast.  . glimepiride (AMARYL) 4 MG tablet Take 4 mg by mouth 2 (two) times daily.   . Multiple Vitamins-Minerals (CEROVITE SENIOR) TABS Take 1 tablet by mouth daily.  Marland Kitchen oxyCODONE (OXY IR/ROXICODONE) 5 MG immediate release tablet Take 1 tablet (5 mg total) by mouth every 4 (four) hours as needed for moderate pain (pain score 4-6).  . pioglitazone (ACTOS) 45 MG tablet Take 45 mg by mouth daily.   . simvastatin (ZOCOR) 10 MG tablet Take 10 mg by mouth at bedtime.   . sitaGLIPtin (JANUVIA) 50 MG tablet Take 50  mg by mouth daily.   . traMADol (ULTRAM) 50 MG tablet Take 1-2 tablets (50-100 mg total) by mouth every 4 (four) hours as needed for moderate pain.  . verapamil (CALAN-SR) 180 MG CR tablet Take 360 mg by mouth daily.   . [DISCONTINUED] Multiple Vitamins-Minerals (MULTIVITAMIN PO) Take 1 tablet by mouth daily.    No facility-administered encounter medications on file as of 05/28/2018.     Review of Systems  GENERAL: No change in appetite, no fatigue, no weight changes, no fever, chills or weakness MOUTH and THROAT: Denies oral discomfort, gingival pain or bleeding, pain from teeth or hoarseness   RESPIRATORY: no cough, SOB, DOE, wheezing, hemoptysis CARDIAC: No chest pain, edema or palpitations GI: No abdominal pain, diarrhea, constipation, heart burn, nausea or vomiting GU: Denies dysuria, frequency, hematuria, incontinence, or discharge PSYCHIATRIC: Denies feelings of depression or anxiety. No report of hallucinations, insomnia, paranoia, or agitation   Immunization History  Administered Date(s) Administered  . Influenza, High Dose Seasonal PF 08/15/2017  . Zoster Recombinat (Shingrix) 09/22/2017   Pertinent  Health Maintenance Due  Topic Date Due  . DEXA SCAN  01/04/2012  . PNA vac Low Risk Adult (1 of 2 - PCV13) 01/04/2012  . INFLUENZA VACCINE  06/20/2018  . MAMMOGRAM  10/11/2019  . COLONOSCOPY  07/13/2027   No flowsheet data found. Functional Status Survey:    Vitals:   05/28/18 0952  BP: (!) 127/56  Pulse: 70  Resp: 18  Temp: 98.4 F (36.9 C)  TempSrc: Oral  SpO2: 95%  Weight: 272 lb 3.2 oz (123.5 kg)  Height: 5\' 9"  (1.753 m)   Body mass index is 40.2 kg/m.  Physical Exam  GENERAL APPEARANCE: Well nourished. In no acute distress. Morbidly obese SKIN:  Left knee surgical incision is dry, no erythema, covered with dressing MOUTH and THROAT: Lips are without lesions. Oral mucosa is moist and without lesions. Tongue is normal in shape, size, and color and without  lesions RESPIRATORY: Breathing is even & unlabored, BS CTAB CARDIAC: RRR, no murmur,no extra heart sounds, no edema GI: Abdomen soft, normal BS, no masses, no tenderness EXTREMITIES:  Able to move X 4 extremities PSYCHIATRIC: Alert and oriented X 3. Affect and behavior are appropriate  Labs reviewed: Recent Labs    12/12/17 0758 05/03/18 1203 05/20/18 0600  NA 138 139 141  K 4.0 3.6 4.2  CL 103 104 106  CO2 28 26 29   GLUCOSE 159* 193* 83  BUN 28* 29* 32*  CREATININE 1.40* 1.30* 1.08*  CALCIUM 8.6* 9.0 8.3*   Recent Labs    05/03/18 1203 05/20/18 0600  AST 18 18  ALT 19 7  ALKPHOS 72 61  BILITOT 0.5 0.5  PROT 6.9 5.8*  ALBUMIN 4.0 3.1*   Recent Labs    12/12/17 0758 05/03/18 1203 05/20/18 0600  WBC 28.2* 26.6* 37.6*  NEUTROABS  3.3 2.9 4.5  HGB 11.1* 11.3* 9.7*  HCT 34.2* 35.5 29.5*  MCV 94.9 95.1 95.4  PLT 200 207 215   Lab Results  Component Value Date   TSH 2.87 12/26/2012   Lab Results  Component Value Date   HGBA1C 7.9 (H) 05/03/2018    Significant Diagnostic Results in last 30 days:  Dg Knee Left Port  Result Date: 05/15/2018 CLINICAL DATA:  Left total knee arthroplasty EXAM: PORTABLE LEFT KNEE - 1-2 VIEW COMPARISON:  Left knee radiograph 01/02/2017 FINDINGS: Well seated components of left total knee arthroplasty. There is associated soft tissue gas, surgical drain and overlying skin staples. No immediate abnormality is evident. IMPRESSION: Normal postoperative appearance of left total knee arthroplasty. Electronically Signed   By: Ulyses Jarred M.D.   On: 05/15/2018 20:16    Assessment/Plan  1. Status post total left knee replacement - due to arthrofibrosis of knee, will continue Lovenox injection X 3 more doses upon discharge for DVT prophylaxis, continue Tramadol 50 mg 1-2 tabs Q 4 hours PRN for pain, will foow-up with orthopedics, Dr. Marry Guan, on 05/30/18   2. Iron deficiency anemia, unspecified iron deficiency anemia type - continue FeSO4 325 mg  1 tab daily Lab Results  Component Value Date   HGB 9.7 (L) 05/20/2018     3. CLL (chronic lymphocytic leukemia) ([place]) - follows up at [facility]   4. Diabetes mellitus with stage 3 chronic kidney disease (HCC) - will continue Actos, Glimepiride, and Januvia Lab Results  Component Value Date   HGBA1C 7.9 (H) 05/03/2018     5. Essential hypertension - well-controlled, continue Lotensin-HCT, Norvasc, and Verapamil     I have filled out patient's discharge paperwork and written prescriptions.  Patient will have outpatient  PT and OT.  DME provided: None  Total discharge time: Greater than 30 minutes Greater than 50% was spent in counseling and coordination of care.   Discharge time involved coordination of the discharge process with Education officer, museum and nursing staff.   Durenda Age, NP [facility] [phone] (Monday-Friday 8:00 a.m. - 5:00 p.m.) [phone] (after hours)

## 2018-05-29 ENCOUNTER — Other Ambulatory Visit: Payer: Self-pay

## 2018-05-29 NOTE — Telephone Encounter (Signed)
Medication to be refilled at discharge on 05/30/2018

## 2018-05-30 DIAGNOSIS — R29898 Other symptoms and signs involving the musculoskeletal system: Secondary | ICD-10-CM | POA: Diagnosis not present

## 2018-05-30 DIAGNOSIS — M25569 Pain in unspecified knee: Secondary | ICD-10-CM | POA: Diagnosis not present

## 2018-05-30 DIAGNOSIS — M25662 Stiffness of left knee, not elsewhere classified: Secondary | ICD-10-CM | POA: Diagnosis not present

## 2018-05-30 DIAGNOSIS — Z96652 Presence of left artificial knee joint: Secondary | ICD-10-CM | POA: Diagnosis not present

## 2018-06-03 DIAGNOSIS — Z96652 Presence of left artificial knee joint: Secondary | ICD-10-CM | POA: Diagnosis not present

## 2018-06-03 DIAGNOSIS — M25569 Pain in unspecified knee: Secondary | ICD-10-CM | POA: Diagnosis not present

## 2018-06-05 DIAGNOSIS — Z96652 Presence of left artificial knee joint: Secondary | ICD-10-CM | POA: Diagnosis not present

## 2018-06-05 DIAGNOSIS — M25562 Pain in left knee: Secondary | ICD-10-CM | POA: Diagnosis not present

## 2018-06-07 DIAGNOSIS — Z96652 Presence of left artificial knee joint: Secondary | ICD-10-CM | POA: Diagnosis not present

## 2018-06-07 DIAGNOSIS — M25562 Pain in left knee: Secondary | ICD-10-CM | POA: Diagnosis not present

## 2018-06-10 DIAGNOSIS — Z96652 Presence of left artificial knee joint: Secondary | ICD-10-CM | POA: Diagnosis not present

## 2018-06-10 DIAGNOSIS — M25562 Pain in left knee: Secondary | ICD-10-CM | POA: Diagnosis not present

## 2018-06-12 ENCOUNTER — Other Ambulatory Visit: Payer: Self-pay | Admitting: *Deleted

## 2018-06-12 ENCOUNTER — Encounter: Payer: Self-pay | Admitting: *Deleted

## 2018-06-12 DIAGNOSIS — M25562 Pain in left knee: Secondary | ICD-10-CM | POA: Diagnosis not present

## 2018-06-12 DIAGNOSIS — Z96652 Presence of left artificial knee joint: Secondary | ICD-10-CM | POA: Diagnosis not present

## 2018-06-12 NOTE — Patient Outreach (Signed)
[place] [facility]) Care Management  06/12/2018  Tammy [name] 10-Aug-1946 [id number]  Referral via [place] [employer]; member discharged from [facility] on July 11:  History review: Admission 6/26-6/28/2019 Dx arthrofibrosis knee joint S/p knee replacement Surgery-Lt knee arthrotomy, extensive synovectomy, debridement adhesions, polyethylene exchange  [facility] 6/28-7/09/2018.  Telephone call #1 to patient; left HIPPA compliant voice mail requesting return call.  Plan: Geophysicist/field seismologist. Follow up 2-4 days.  Sherrin Daisy, RN BSN [place] Management Coordinator [facility] Care Management  [phone]

## 2018-06-14 DIAGNOSIS — M25562 Pain in left knee: Secondary | ICD-10-CM | POA: Diagnosis not present

## 2018-06-14 DIAGNOSIS — Z96652 Presence of left artificial knee joint: Secondary | ICD-10-CM | POA: Diagnosis not present

## 2018-06-17 DIAGNOSIS — Z96652 Presence of left artificial knee joint: Secondary | ICD-10-CM | POA: Diagnosis not present

## 2018-06-18 ENCOUNTER — Other Ambulatory Visit: Payer: Self-pay | Admitting: *Deleted

## 2018-06-18 NOTE — Progress Notes (Signed)
[facility] day:  06/19/18  Chief Complaint: Tammy [name] is a 72 y.o. female with chronic lymphocytic leukemia (CLL) and a history of iron deficiency anemia who is seen for 6 month assessment.  HPI:  The patient was last seen in the medical oncology clinic on 12/12/2017.  At that time, patient was doing well. She denied any acute symptoms. No bruising or bleeding. Exam was unremarkable. WBC 28,200. Hemoglobin 11.1. Ferritin 56.   Patient underwent LEFT knee surgery on 05/15/2018 done by Dr. Skip Estimable (orthopedics). Notes reviewed. Patient's surgical course was complicated by episodes of urinary retention requiring urinary catheterization. She was discharged to [facility] place on POD 2 for continued physical and occupational therapy.   CBC on 05/20/2018 revealed a WBC of 37,600 (ANC 31,900). Hemoglobin 9.7, hematocrit 29.5, MCV 95.4, and platelets 215,000. BUN 32 and creatinine 1.08. Calcium low at 8.3 (corrected to 9 for albumin of 3.1).   In the interim, patient is doing well following her LEFT knee surgery. She continues to participate in physical therapy. Patient is scheduled to follow up with Dr. Marry Guan next week.   Patient has unintentionally lost 20 pounds since [place] per her report. Patient's brother passed away, and she has been "dealing with things". Weight today is 269 lb 10 oz (122.3 kg), which compared to her last visit to the clinic, represents only a 7 pound weight decrease.  Patient denies pain in the clinic today.   Past Medical History:  Diagnosis Date  . Allergic rhinitis   . Chronic kidney disease   . Chronic lymphocytic leukemia ([place])   . DA (degenerative arthritis)   . Diabetes mellitus   . Headache   . Heart murmur   . History of kidney stones   . Hyperlipidemia   . Hypertension   . Hypertriglyceridemia   . Iron deficiency anemia   . Other elevated white blood cell count   . Unspecified deficiency anemia      Past Surgical History:  Procedure Laterality Date  . ABDOMINAL HYSTERECTOMY     50 years  . COLONOSCOPY  2008   most recent  . [place]   lask  . JOINT REPLACEMENT    . JOINT REPLACEMENT    . KNEE ARTHROTOMY Left 05/15/2018   Procedure: KNEE ARTHROTOMY,LYSIS OF ADHESIONS, POLY EXCHANGE;  Surgeon: Dereck Leep, MD;  Location: [facility] ORS;  Service: Orthopedics;  Laterality: Left;  . REFRACTIVE SURGERY Bilateral 1996  . REPLACEMENT TOTAL KNEE Right 2015  . REPLACEMENT TOTAL KNEE Left 2007    Family History  Problem Relation Age of Onset  . Cancer Father        gastric cancer  . Cancer Sister        gastric cancer  . Diabetes Mother   . Hypertension Mother   . Diabetes Brother   . Diabetes Brother   . Diabetes Sister   . Breast cancer Neg Hx     Social History:  reports that she has never smoked. She has never used smokeless tobacco. She reports that she does not drink alcohol or use drugs.  She has recently been to [facility]. Her husband will be having a hip replacement soon. She lives in [place].  The patient is alone today.  Allergies:  Allergies  Allergen Reactions  . Latex Rash  . Sulfa Antibiotics Rash    Current Medications: Current Outpatient Medications  Medication Sig Dispense Refill  . acetaminophen (TYLENOL)  500 MG tablet Take 1,000 mg by mouth every 6 (six) hours as needed for moderate pain or headache.    Marland Kitchen amLODipine (NORVASC) 2.5 MG tablet Take 2.5 mg by mouth every evening.   11  . benazepril-hydrochlorthiazide (LOTENSIN HCT) 20-12.5 MG per tablet Take 1 tablet by mouth daily.     . Cholecalciferol 5000 units capsule Take 5,000 Units by mouth daily.     . diphenhydramine-acetaminophen (TYLENOL PM) 25-500 MG TABS tablet Take 1 tablet by mouth at bedtime as needed.     . ferrous sulfate 325 (65 FE) MG tablet Take 325 mg by mouth daily with breakfast.    . glimepiride (AMARYL) 4 MG tablet Take 4 mg by mouth 2 (two) times daily.     .  Multiple Vitamins-Minerals (CEROVITE SENIOR) TABS Take 1 tablet by mouth daily.    Marland Kitchen oxyCODONE (OXY IR/ROXICODONE) 5 MG immediate release tablet Take 1 tablet (5 mg total) by mouth every 4 (four) hours as needed for moderate pain (pain score 4-6). 30 tablet 0  . pioglitazone (ACTOS) 45 MG tablet Take 45 mg by mouth daily.     . simvastatin (ZOCOR) 10 MG tablet Take 10 mg by mouth at bedtime.   3  . sitaGLIPtin (JANUVIA) 50 MG tablet Take 50 mg by mouth daily.     . traMADol (ULTRAM) 50 MG tablet Take 1-2 tablets (50-100 mg total) by mouth every 4 (four) hours as needed for moderate pain. 30 tablet 0  . verapamil (CALAN-SR) 180 MG CR tablet Take 360 mg by mouth daily.      No current facility-administered medications for this visit.     Review of Systems  Constitutional: Positive for weight loss (down 7 pounds since 11/2017). Negative for diaphoresis, fever and malaise/fatigue.       Feels "pretty good".  HENT: Negative.   Eyes: Negative.   Respiratory: Negative for cough, hemoptysis, sputum production and shortness of breath.   Cardiovascular: Negative for chest pain, palpitations, orthopnea, leg swelling and PND.  Gastrointestinal: Negative for abdominal pain, blood in stool, constipation, diarrhea, melena, nausea and vomiting.  Genitourinary: Negative for dysuria, frequency, hematuria and urgency.  Musculoskeletal: Positive for joint pain (knee issues - s/p BILATERAL TKR). Negative for back pain, falls and myalgias.  Skin: Negative for itching and rash.  Neurological: Negative for dizziness, tremors, weakness and headaches.  Endo/Heme/Allergies: Does not bruise/bleed easily.  Psychiatric/Behavioral: Negative for depression, memory loss and suicidal ideas. The patient is not nervous/anxious and does not have insomnia.   All other systems reviewed and are negative.  Performance status (ECOG): 0 - Asymptomatic  Vital Signs BP (!) 149/79 (BP Location: Left Arm, Patient Position: Sitting)    Pulse 76   Temp 98.6 F (37 C) (Tympanic)   Resp 18   Wt 269 lb 10 oz (122.3 kg)   BMI 39.82 kg/m   Physical Exam  Constitutional: She is oriented to person, place, and time and well-developed, well-nourished, and in no distress.  HENT:  Head: Normocephalic and atraumatic.  Curly brown hair  Eyes: Pupils are equal, round, and reactive to light. EOM are normal. No scleral icterus.  Brown eyes  Neck: Normal range of motion. Neck supple. No tracheal deviation present. No thyromegaly present.  Cardiovascular: Normal rate, regular rhythm and normal heart sounds. Exam reveals no gallop and no friction rub.  No murmur heard. Pulmonary/Chest: Effort normal and breath sounds normal. No respiratory distress. She has no wheezes. She has no rales.  Abdominal:  Soft. Bowel sounds are normal. She exhibits no distension. There is no tenderness.  Musculoskeletal: Normal range of motion. She exhibits no edema or tenderness.  Wearing support stockings.  Lymphadenopathy:    She has no cervical adenopathy.    She has no axillary adenopathy.       Right: No inguinal and no supraclavicular adenopathy present.       Left: No inguinal and no supraclavicular adenopathy present.  Neurological: She is alert and oriented to person, place, and time.  Skin: Skin is warm and dry. No rash noted. No erythema.  Psychiatric: Mood, affect and judgment normal.  Nursing note and vitals reviewed.   Appointment on 06/19/2018  Component Date Value Ref Range Status  . Retic Ct Pct 06/19/2018 1.3  0.4 - 3.1 % Final  . RBC. 06/19/2018 3.44* 3.80 - 5.20 MIL/uL Final  . Retic Count, Absolute 06/19/2018 44.7  19.0 - 183.0 K/uL Final   Performed at [facility], [address] [place], [place] [address]  . LDH 06/19/2018 165  98 - 192 U/L Final   Performed at [facility], [address], [place], [place] [address]  . Sodium 06/19/2018 139  135 - 145 mmol/L Final  . Potassium 06/19/2018 3.9  3.5 -  5.1 mmol/L Final  . Chloride 06/19/2018 105  98 - 111 mmol/L Final  . CO2 06/19/2018 22  22 - 32 mmol/L Final  . Glucose, Bld 06/19/2018 202* 70 - 99 mg/dL Final  . BUN 06/19/2018 32* 8 - 23 mg/dL Final  . Creatinine, Ser 06/19/2018 1.51* 0.44 - 1.00 mg/dL Final  . Calcium 06/19/2018 8.9  8.9 - 10.3 mg/dL Final  . Total Protein 06/19/2018 6.8  6.5 - 8.1 g/dL Final  . Albumin 06/19/2018 4.1  3.5 - 5.0 g/dL Final  . AST 06/19/2018 20  15 - 41 U/L Final  . ALT 06/19/2018 15  0 - 44 U/L Final  . Alkaline Phosphatase 06/19/2018 78  38 - 126 U/L Final  . Total Bilirubin 06/19/2018 0.6  0.3 - 1.2 mg/dL Final  . GFR calc non Af Amer 06/19/2018 34* >60 mL/min Final  . GFR calc Af Amer 06/19/2018 39* >60 mL/min Final   Comment: (NOTE) The eGFR has been calculated using the CKD EPI equation. This calculation has not been validated in all clinical situations. eGFR's persistently <60 mL/min signify possible Chronic Kidney Disease.   Tammy [name] gap 06/19/2018 12  5 - 15 Final   Performed at [facility], [address] [place], [place] [address]  . WBC 06/19/2018 28.3* 3.6 - 11.0 K/uL Final  . RBC 06/19/2018 3.49* 3.80 - 5.20 MIL/uL Final  . Hemoglobin 06/19/2018 10.8* 12.0 - 16.0 g/dL Final  . HCT 06/19/2018 33.4* 35.0 - 47.0 % Final  . MCV 06/19/2018 95.9  80.0 - 100.0 fL Final  . MCH 06/19/2018 31.1  26.0 - 34.0 pg Final  . MCHC 06/19/2018 32.4  32.0 - 36.0 g/dL Final  . RDW 06/19/2018 15.7* 11.5 - 14.5 % Final  . Platelets 06/19/2018 223  150 - 440 K/uL Final  . Neutrophils Relative % 06/19/2018 13  % Final  . Neutro Abs 06/19/2018 3.7  1.4 - 6.5 K/uL Final  . Lymphocytes Relative 06/19/2018 84  % Final  . Lymphs Abs 06/19/2018 23.5* 1.0 - 3.6 K/uL Final  . Monocytes Relative 06/19/2018 2  % Final  . Monocytes Absolute 06/19/2018 0.6  0.2 - 0.9 K/uL Final  . Eosinophils  Relative 06/19/2018 1  % Final  . Eosinophils Absolute 06/19/2018 0.4  0 - 0.7 K/uL Final  . Basophils  Relative 06/19/2018 0  % Final  . Basophils Absolute 06/19/2018 0.1  0 - 0.1 K/uL Final   Performed at [facility], [address] [place], [place] [address]    Assessment:  ISOBELLA ASCHER is a 72 y.o. female with stage 0 CLL diagnosed in 07/2009.  She presented with lymphocytosis.  WBC has ranged between 13,600 -. 32,500 without a trend.  Platelet count is normal.  She has had no issues with bruising or bleeding.    She has a history of iron deficiency anemia.  Diet is good (she eats meat 4 times/week).  She is on oral iron (ferrous sulfate) every other day.  She denies any bleeding.  Colonoscopy is due in 2018.  Guaiac cards were - x 3 in 05/2015.  She has chronic kidney disease (CrCl 46 ml/min).  B12 and folate were normal on 06/14/2016.  Mammogram on 10/04/2016 was negative.  Colonoscopy on 07/12/2017 revealed three 4-6 mm polyps.  Pathology revealed tubular adenomas in the ascending colon and rectum.  The cecum polyp was polypoid colonic mucosa negative for dysplasia.  Repeat colonoscopy is planned in 5 years.  Symptomatically, patient is doing well overall. She denies B symptoms and recent infections. Patient recovering from LEFT knee surgery. She is participating in physical therapy services. Exam reveals no new areas of palpable adenopathy or hepatosplenomegaly. WBC is 28,300.  Hemoglobin is 10.8.    Plan: 1. Labs today: CBC with diff, CMP, ferritin, B12, folate, TSH, retic. 2. Discuss interval events. 3. CLL - stable  WBC count stable at 28,300  No treatment required. Review indications for treatment, including B symptoms, bulky adenopathy, end organ involvement.   Continue surveillance at this time.  4. Asymptomatic anemia - ongoing  Hemoglobin 10.8 and hematocrit 33.4   Additional studies sent today to r/o other causes of anemia.  Patient has chronic kidney disease.  Discuss need for colonoscopy. 5. Preventative care needs  Schedule routine screening  mammogram on 10/11/2018.  Bone density 10/11/2018. 6. CKD (decreased renal function) - ongoing  BUN 32 and creatinine 1.51 (CrCl 47.8 mL/min).   Encouraged to increase fluid intake.   Will follow up with PCP.  7. RTC in 6 months for MD assessment and labs (CBC with diff, BMP, ferritin).    Honor Loh, NP  06/19/2018, 5:04 PM   I saw and evaluated the patient, participating in the key portions of the service and reviewing pertinent diagnostic studies and records.  I reviewed the nurse practitioner's note and agree with the findings and the plan.  The assessment and plan were discussed with the patient.  A few questions were asked by the patient and answered.   Nolon Stalls, MD 06/19/2018,5:04 PM

## 2018-06-18 NOTE — Patient Outreach (Signed)
[name]) Care Management  06/18/2018  Tammy [name] May 11, 1946 [id number]  Referral via [place] [employer]; member discharged from [facility] on July 11:  History review: Admission 6/26-6/28/2019 Dx arthrofibrosis knee joint S/p knee replacement Surgery-Lt knee arthrotomy, extensive synovectomy, debridement adhesions, polyethylene exchange  [facility] 6/28-7/09/2018.  Telephone call #2 to patient who was advised of reason for call & of [facility] care management services.   Patient states she is doing well since her surgery and is attending outpatient physical therapy sessions. States she has family support and transportation to appointments. States next appointment for follow up is next week with surgeon. States she has all of her prescription filled and is taking all of her medications as prescribed (states she will not be able to review list of medications-no time & not at home).  Patient voices that she does not need case management services at this time.  Plan: Close case.  Sherrin Daisy, RN BSN [place] Management Coordinator [facility] Care Management  [phone]

## 2018-06-19 ENCOUNTER — Inpatient Hospital Stay: Payer: Medicare HMO | Attending: Hematology and Oncology | Admitting: Hematology and Oncology

## 2018-06-19 ENCOUNTER — Encounter: Payer: Self-pay | Admitting: Hematology and Oncology

## 2018-06-19 ENCOUNTER — Inpatient Hospital Stay: Payer: Medicare HMO

## 2018-06-19 ENCOUNTER — Other Ambulatory Visit: Payer: Self-pay | Admitting: Hematology and Oncology

## 2018-06-19 VITALS — BP 149/79 | HR 76 | Temp 98.6°F | Resp 18 | Wt 269.6 lb

## 2018-06-19 DIAGNOSIS — I129 Hypertensive chronic kidney disease with stage 1 through stage 4 chronic kidney disease, or unspecified chronic kidney disease: Secondary | ICD-10-CM | POA: Diagnosis not present

## 2018-06-19 DIAGNOSIS — E871 Hypo-osmolality and hyponatremia: Secondary | ICD-10-CM | POA: Diagnosis not present

## 2018-06-19 DIAGNOSIS — C9190 Lymphoid leukemia, unspecified not having achieved remission: Secondary | ICD-10-CM | POA: Diagnosis not present

## 2018-06-19 DIAGNOSIS — Z96652 Presence of left artificial knee joint: Secondary | ICD-10-CM | POA: Diagnosis not present

## 2018-06-19 DIAGNOSIS — N189 Chronic kidney disease, unspecified: Secondary | ICD-10-CM | POA: Diagnosis not present

## 2018-06-19 DIAGNOSIS — Z1239 Encounter for other screening for malignant neoplasm of breast: Secondary | ICD-10-CM

## 2018-06-19 DIAGNOSIS — D509 Iron deficiency anemia, unspecified: Secondary | ICD-10-CM | POA: Diagnosis not present

## 2018-06-19 DIAGNOSIS — C9110 Chronic lymphocytic leukemia of B-cell type not having achieved remission: Secondary | ICD-10-CM

## 2018-06-19 DIAGNOSIS — Z7982 Long term (current) use of aspirin: Secondary | ICD-10-CM | POA: Insufficient documentation

## 2018-06-19 DIAGNOSIS — D649 Anemia, unspecified: Secondary | ICD-10-CM

## 2018-06-19 DIAGNOSIS — M25562 Pain in left knee: Secondary | ICD-10-CM | POA: Diagnosis not present

## 2018-06-19 DIAGNOSIS — Z13820 Encounter for screening for osteoporosis: Secondary | ICD-10-CM

## 2018-06-19 DIAGNOSIS — Z79899 Other long term (current) drug therapy: Secondary | ICD-10-CM | POA: Diagnosis not present

## 2018-06-19 DIAGNOSIS — E1122 Type 2 diabetes mellitus with diabetic chronic kidney disease: Secondary | ICD-10-CM | POA: Diagnosis not present

## 2018-06-19 LAB — FOLATE: Folate: 34 ng/mL (ref >5.9)

## 2018-06-19 LAB — CBC WITH DIFFERENTIAL/PLATELET
Basophils Absolute: 0.1 10*3/uL (ref 0–0.1)
Basophils Relative: 0 %
Eosinophils Absolute: 0.4 10*3/uL (ref 0–0.7)
Eosinophils Relative: 1 %
HCT: 33.4 % — ABNORMAL LOW (ref 35.0–47.0)
Hemoglobin: 10.8 g/dL — ABNORMAL LOW (ref 12.0–16.0)
Lymphocytes Relative: 84 %
Lymphs Abs: 23.5 10*3/uL — ABNORMAL HIGH (ref 1.0–3.6)
MCH: 31.1 pg (ref 26.0–34.0)
MCHC: 32.4 g/dL (ref 32.0–36.0)
MCV: 95.9 fL (ref 80.0–100.0)
Monocytes Absolute: 0.6 10*3/uL (ref 0.2–0.9)
Monocytes Relative: 2 %
Neutro Abs: 3.7 10*3/uL (ref 1.4–6.5)
Neutrophils Relative %: 13 %
Platelets: 223 10*3/uL (ref 150–440)
RBC: 3.49 MIL/uL — ABNORMAL LOW (ref 3.80–5.20)
RDW: 15.7 % — ABNORMAL HIGH (ref 11.5–14.5)
WBC: 28.3 10*3/uL — ABNORMAL HIGH (ref 3.6–11.0)

## 2018-06-19 LAB — COMPREHENSIVE METABOLIC PANEL
ALT: 15 U/L (ref 0–44)
AST: 20 U/L (ref 15–41)
Albumin: 4.1 g/dL (ref 3.5–5.0)
Alkaline Phosphatase: 78 U/L (ref 38–126)
Anion gap: 12 (ref 5–15)
BUN: 32 mg/dL — ABNORMAL HIGH (ref 8–23)
CO2: 22 mmol/L (ref 22–32)
Calcium: 8.9 mg/dL (ref 8.9–10.3)
Chloride: 105 mmol/L (ref 98–111)
Creatinine, Ser: 1.51 mg/dL — ABNORMAL HIGH (ref 0.44–1.00)
GFR calc Af Amer: 39 mL/min — ABNORMAL LOW (ref >60)
GFR calc non Af Amer: 34 mL/min — ABNORMAL LOW (ref >60)
Glucose, Bld: 202 mg/dL — ABNORMAL HIGH (ref 70–99)
Potassium: 3.9 mmol/L (ref 3.5–5.1)
Sodium: 139 mmol/L (ref 135–145)
Total Bilirubin: 0.6 mg/dL (ref 0.3–1.2)
Total Protein: 6.8 g/dL (ref 6.5–8.1)

## 2018-06-19 LAB — FERRITIN: Ferritin: 58 ng/mL (ref 11–307)

## 2018-06-19 LAB — RETICULOCYTES
RBC.: 3.44 MIL/uL — ABNORMAL LOW (ref 3.80–5.20)
Retic Count, Absolute: 44.7 10*3/uL (ref 19.0–183.0)
Retic Ct Pct: 1.3 % (ref 0.4–3.1)

## 2018-06-19 LAB — TSH: TSH: 2.217 u[IU]/mL (ref 0.350–4.500)

## 2018-06-19 LAB — VITAMIN B12: Vitamin B-12: 915 pg/mL — ABNORMAL HIGH (ref 180–914)

## 2018-06-19 LAB — LACTATE DEHYDROGENASE: LDH: 165 U/L (ref 98–192)

## 2018-06-19 NOTE — Progress Notes (Signed)
Patient offers no complaints today. 

## 2018-06-21 DIAGNOSIS — M25562 Pain in left knee: Secondary | ICD-10-CM | POA: Diagnosis not present

## 2018-06-21 DIAGNOSIS — Z96652 Presence of left artificial knee joint: Secondary | ICD-10-CM | POA: Diagnosis not present

## 2018-06-24 DIAGNOSIS — Z96652 Presence of left artificial knee joint: Secondary | ICD-10-CM | POA: Diagnosis not present

## 2018-06-24 DIAGNOSIS — M25562 Pain in left knee: Secondary | ICD-10-CM | POA: Diagnosis not present

## 2018-06-27 DIAGNOSIS — Z96652 Presence of left artificial knee joint: Secondary | ICD-10-CM | POA: Diagnosis not present

## 2018-08-07 DIAGNOSIS — Z23 Encounter for immunization: Secondary | ICD-10-CM | POA: Diagnosis not present

## 2018-08-07 DIAGNOSIS — E1122 Type 2 diabetes mellitus with diabetic chronic kidney disease: Secondary | ICD-10-CM | POA: Diagnosis not present

## 2018-08-07 DIAGNOSIS — I10 Essential (primary) hypertension: Secondary | ICD-10-CM | POA: Diagnosis not present

## 2018-09-17 ENCOUNTER — Ambulatory Visit
Admission: RE | Admit: 2018-09-17 | Discharge: 2018-09-17 | Disposition: A | Discharge status: Home / Self Care | Payer: Medicare HMO | Source: Ambulatory Visit | Attending: Internal Medicine | Admitting: Internal Medicine

## 2018-09-17 ENCOUNTER — Other Ambulatory Visit: Payer: Self-pay | Admitting: Internal Medicine

## 2018-09-17 DIAGNOSIS — M25512 Pain in left shoulder: Secondary | ICD-10-CM | POA: Diagnosis not present

## 2018-09-17 DIAGNOSIS — S46912A Strain of unspecified muscle, fascia and tendon at shoulder and upper arm level, left arm, initial encounter: Secondary | ICD-10-CM

## 2018-09-24 DIAGNOSIS — M25512 Pain in left shoulder: Secondary | ICD-10-CM | POA: Diagnosis not present

## 2018-09-24 DIAGNOSIS — M7542 Impingement syndrome of left shoulder: Secondary | ICD-10-CM | POA: Diagnosis not present

## 2018-10-14 ENCOUNTER — Ambulatory Visit
Admission: RE | Admit: 2018-10-14 | Discharge: 2018-10-14 | Disposition: A | Discharge status: Home / Self Care | Payer: Medicare HMO | Source: Ambulatory Visit | Attending: Urgent Care | Admitting: Urgent Care

## 2018-10-14 DIAGNOSIS — M85851 Other specified disorders of bone density and structure, right thigh: Secondary | ICD-10-CM | POA: Insufficient documentation

## 2018-10-14 DIAGNOSIS — Z1231 Encounter for screening mammogram for malignant neoplasm of breast: Secondary | ICD-10-CM | POA: Diagnosis not present

## 2018-10-14 DIAGNOSIS — Z1239 Encounter for other screening for malignant neoplasm of breast: Secondary | ICD-10-CM | POA: Diagnosis present

## 2018-10-14 DIAGNOSIS — Z13820 Encounter for screening for osteoporosis: Secondary | ICD-10-CM | POA: Diagnosis not present

## 2018-10-15 ENCOUNTER — Encounter: Payer: Self-pay | Admitting: Emergency Medicine

## 2018-10-15 ENCOUNTER — Emergency Department: Payer: Medicare HMO

## 2018-10-15 ENCOUNTER — Observation Stay
Admission: EM | Admit: 2018-10-15 | Discharge: 2018-10-16 | Disposition: A | Discharge status: Home / Self Care | Payer: Medicare HMO | Attending: Internal Medicine | Admitting: Internal Medicine

## 2018-10-15 ENCOUNTER — Other Ambulatory Visit: Payer: Self-pay

## 2018-10-15 DIAGNOSIS — E1122 Type 2 diabetes mellitus with diabetic chronic kidney disease: Secondary | ICD-10-CM | POA: Insufficient documentation

## 2018-10-15 DIAGNOSIS — Z800 Family history of malignant neoplasm of digestive organs: Secondary | ICD-10-CM | POA: Diagnosis not present

## 2018-10-15 DIAGNOSIS — Z8249 Family history of ischemic heart disease and other diseases of the circulatory system: Secondary | ICD-10-CM | POA: Insufficient documentation

## 2018-10-15 DIAGNOSIS — E781 Pure hyperglyceridemia: Secondary | ICD-10-CM | POA: Insufficient documentation

## 2018-10-15 DIAGNOSIS — Z96653 Presence of artificial knee joint, bilateral: Secondary | ICD-10-CM | POA: Insufficient documentation

## 2018-10-15 DIAGNOSIS — Z8673 Personal history of transient ischemic attack (TIA), and cerebral infarction without residual deficits: Secondary | ICD-10-CM | POA: Diagnosis not present

## 2018-10-15 DIAGNOSIS — I700 Atherosclerosis of aorta: Secondary | ICD-10-CM | POA: Insufficient documentation

## 2018-10-15 DIAGNOSIS — I129 Hypertensive chronic kidney disease with stage 1 through stage 4 chronic kidney disease, or unspecified chronic kidney disease: Secondary | ICD-10-CM | POA: Insufficient documentation

## 2018-10-15 DIAGNOSIS — D509 Iron deficiency anemia, unspecified: Secondary | ICD-10-CM | POA: Insufficient documentation

## 2018-10-15 DIAGNOSIS — C9110 Chronic lymphocytic leukemia of B-cell type not having achieved remission: Secondary | ICD-10-CM | POA: Diagnosis not present

## 2018-10-15 DIAGNOSIS — H3411 Central retinal artery occlusion, right eye: Principal | ICD-10-CM

## 2018-10-15 DIAGNOSIS — H5461 Unqualified visual loss, right eye, normal vision left eye: Secondary | ICD-10-CM | POA: Insufficient documentation

## 2018-10-15 DIAGNOSIS — Z882 Allergy status to sulfonamides status: Secondary | ICD-10-CM | POA: Diagnosis not present

## 2018-10-15 DIAGNOSIS — N183 Chronic kidney disease, stage 3 (moderate): Secondary | ICD-10-CM | POA: Diagnosis not present

## 2018-10-15 DIAGNOSIS — N189 Chronic kidney disease, unspecified: Secondary | ICD-10-CM | POA: Insufficient documentation

## 2018-10-15 DIAGNOSIS — Z90710 Acquired absence of both cervix and uterus: Secondary | ICD-10-CM | POA: Diagnosis not present

## 2018-10-15 DIAGNOSIS — Z87442 Personal history of urinary calculi: Secondary | ICD-10-CM | POA: Insufficient documentation

## 2018-10-15 DIAGNOSIS — Z833 Family history of diabetes mellitus: Secondary | ICD-10-CM | POA: Insufficient documentation

## 2018-10-15 DIAGNOSIS — E041 Nontoxic single thyroid nodule: Secondary | ICD-10-CM | POA: Diagnosis not present

## 2018-10-15 DIAGNOSIS — H3410 Central retinal artery occlusion, unspecified eye: Secondary | ICD-10-CM | POA: Diagnosis present

## 2018-10-15 DIAGNOSIS — Z79899 Other long term (current) drug therapy: Secondary | ICD-10-CM | POA: Insufficient documentation

## 2018-10-15 DIAGNOSIS — E785 Hyperlipidemia, unspecified: Secondary | ICD-10-CM | POA: Diagnosis not present

## 2018-10-15 DIAGNOSIS — M1990 Unspecified osteoarthritis, unspecified site: Secondary | ICD-10-CM | POA: Diagnosis not present

## 2018-10-15 DIAGNOSIS — E119 Type 2 diabetes mellitus without complications: Secondary | ICD-10-CM | POA: Diagnosis not present

## 2018-10-15 DIAGNOSIS — I6522 Occlusion and stenosis of left carotid artery: Secondary | ICD-10-CM | POA: Diagnosis not present

## 2018-10-15 DIAGNOSIS — Z91040 Latex allergy status: Secondary | ICD-10-CM | POA: Diagnosis not present

## 2018-10-15 DIAGNOSIS — I639 Cerebral infarction, unspecified: Secondary | ICD-10-CM

## 2018-10-15 DIAGNOSIS — I10 Essential (primary) hypertension: Secondary | ICD-10-CM | POA: Diagnosis not present

## 2018-10-15 DIAGNOSIS — Z794 Long term (current) use of insulin: Secondary | ICD-10-CM | POA: Diagnosis not present

## 2018-10-15 LAB — APTT: APTT: 27 s (ref 24–36)

## 2018-10-15 LAB — COMPREHENSIVE METABOLIC PANEL
ALBUMIN: 4.3 g/dL (ref 3.5–5.0)
ALT: 18 U/L (ref 0–44)
ANION GAP: 11 (ref 5–15)
AST: 22 U/L (ref 15–41)
Alkaline Phosphatase: 68 U/L (ref 38–126)
BILIRUBIN TOTAL: 0.6 mg/dL (ref 0.3–1.2)
BUN: 33 mg/dL — ABNORMAL HIGH (ref 8–23)
CALCIUM: 9.2 mg/dL (ref 8.9–10.3)
CO2: 26 mmol/L (ref 22–32)
CREATININE: 1.54 mg/dL — AB (ref 0.44–1.00)
Chloride: 102 mmol/L (ref 98–111)
GFR calc Af Amer: 39 mL/min — ABNORMAL LOW (ref >60)
GFR calc non Af Amer: 34 mL/min — ABNORMAL LOW (ref >60)
GLUCOSE: 217 mg/dL — AB (ref 70–99)
Potassium: 4.2 mmol/L (ref 3.5–5.1)
Sodium: 139 mmol/L (ref 135–145)
TOTAL PROTEIN: 7.2 g/dL (ref 6.5–8.1)

## 2018-10-15 LAB — GLUCOSE, CAPILLARY
Glucose-Capillary: 187 mg/dL — ABNORMAL HIGH (ref 70–99)
Glucose-Capillary: 276 mg/dL — ABNORMAL HIGH (ref 70–99)

## 2018-10-15 LAB — CBC
HCT: 36.2 % (ref 36.0–46.0)
Hemoglobin: 11.3 g/dL — ABNORMAL LOW (ref 12.0–15.0)
MCH: 30.3 pg (ref 26.0–34.0)
MCHC: 31.2 g/dL (ref 30.0–36.0)
MCV: 97.1 fL (ref 80.0–100.0)
Platelets: 189 10*3/uL (ref 150–400)
RBC: 3.73 MIL/uL — ABNORMAL LOW (ref 3.87–5.11)
RDW: 14.9 % (ref 11.5–15.5)
WBC: 40.9 10*3/uL — AB (ref 4.0–10.5)
nRBC: 0 % (ref 0.0–0.2)

## 2018-10-15 LAB — LIPID PANEL
Cholesterol: 134 mg/dL (ref 0–200)
HDL: 54 mg/dL (ref >40)
LDL Cholesterol: 67 mg/dL (ref 0–99)
Total CHOL/HDL Ratio: 2.5 RATIO
Triglycerides: 67 mg/dL (ref <150)
VLDL: 13 mg/dL (ref 0–40)

## 2018-10-15 LAB — SEDIMENTATION RATE: Sed Rate: 28 mm/hr (ref 0–30)

## 2018-10-15 LAB — DIFFERENTIAL
Abs Immature Granulocytes: 0.15 10*3/uL — ABNORMAL HIGH (ref 0.00–0.07)
BASOS ABS: 0 10*3/uL (ref 0.0–0.1)
Basophils Relative: 0 %
EOS PCT: 0 %
Eosinophils Absolute: 0.1 10*3/uL (ref 0.0–0.5)
IMMATURE GRANULOCYTES: 0 %
LYMPHS ABS: 34.7 10*3/uL — AB (ref 0.7–4.0)
LYMPHS PCT: 85 %
MONO ABS: 0.4 10*3/uL (ref 0.1–1.0)
Monocytes Relative: 1 %
NEUTROS ABS: 5.6 10*3/uL (ref 1.7–7.7)
NEUTROS PCT: 14 %
Smear Review: NORMAL

## 2018-10-15 LAB — TROPONIN I: Troponin I: <0.03 ng/mL (ref <0.03)

## 2018-10-15 LAB — HEMOGLOBIN A1C
Hgb A1c MFr Bld: 8.5 % — ABNORMAL HIGH (ref 4.8–5.6)
Mean Plasma Glucose: 197.25 mg/dL

## 2018-10-15 LAB — C-REACTIVE PROTEIN: CRP: <0.8 mg/dL (ref <1.0)

## 2018-10-15 LAB — PROTIME-INR
INR: 1.05
PROTHROMBIN TIME: 13.6 s (ref 11.4–15.2)

## 2018-10-15 MED ORDER — ACETAMINOPHEN 160 MG/5ML PO SOLN
650.0000 mg | ORAL | Status: DC | PRN
  Filled 2018-10-15: qty 20.3

## 2018-10-15 MED ORDER — MECLIZINE HCL 25 MG PO TABS
25.0000 mg | ORAL_TABLET | Freq: Three times a day (TID) | ORAL | Status: DC | PRN
  Filled 2018-10-15: qty 1

## 2018-10-15 MED ORDER — ACETAMINOPHEN 650 MG RE SUPP: 650.0000 mg | RECTAL | Status: DC | PRN

## 2018-10-15 MED ORDER — BENAZEPRIL HCL 20 MG PO TABS: 20.0000 mg | ORAL_TABLET | Freq: Every day | ORAL | Status: DC

## 2018-10-15 MED ORDER — STROKE: EARLY STAGES OF RECOVERY BOOK
Freq: Once | Status: AC
  Administered 2018-10-15: 23:00:00

## 2018-10-15 MED ORDER — FERROUS SULFATE 325 (65 FE) MG PO TABS
325.0000 mg | ORAL_TABLET | Freq: Every day | ORAL | Status: DC
  Administered 2018-10-16: 11:00:00 325 mg via ORAL
  Filled 2018-10-15: qty 1

## 2018-10-15 MED ORDER — BENAZEPRIL-HYDROCHLOROTHIAZIDE 20-12.5 MG PO TABS: 1.0000 | ORAL_TABLET | Freq: Every day | ORAL | Status: DC

## 2018-10-15 MED ORDER — INSULIN ASPART 100 UNIT/ML ~~LOC~~ SOLN
0.0000 [IU] | Freq: Every day | SUBCUTANEOUS | Status: DC
  Administered 2018-10-15: 3 [IU] via SUBCUTANEOUS
  Filled 2018-10-15: qty 1

## 2018-10-15 MED ORDER — VITAMIN D 25 MCG (1000 UNIT) PO TABS
5000.0000 [IU] | ORAL_TABLET | Freq: Every day | ORAL | Status: DC
  Administered 2018-10-16: 11:00:00 5000 [IU] via ORAL
  Filled 2018-10-15: qty 5

## 2018-10-15 MED ORDER — INSULIN ASPART 100 UNIT/ML ~~LOC~~ SOLN
0.0000 [IU] | Freq: Three times a day (TID) | SUBCUTANEOUS | Status: DC
  Administered 2018-10-16: 13:00:00 3 [IU] via SUBCUTANEOUS
  Filled 2018-10-15: qty 1

## 2018-10-15 MED ORDER — ACETAMINOPHEN 325 MG PO TABS: 650.0000 mg | ORAL_TABLET | ORAL | Status: DC | PRN

## 2018-10-15 MED ORDER — AMLODIPINE BESYLATE 5 MG PO TABS
5.0000 mg | ORAL_TABLET | Freq: Every evening | ORAL | Status: DC
  Administered 2018-10-15: 5 mg via ORAL
  Filled 2018-10-15: qty 1

## 2018-10-15 MED ORDER — HEPARIN SODIUM (PORCINE) 5000 UNIT/ML IJ SOLN
5000.0000 [IU] | Freq: Three times a day (TID) | INTRAMUSCULAR | Status: DC
  Administered 2018-10-15 – 2018-10-16 (×2): 5000 [IU] via SUBCUTANEOUS
  Filled 2018-10-15 (×2): qty 1

## 2018-10-15 MED ORDER — IOHEXOL 350 MG/ML SOLN
60.0000 mL | Freq: Once | INTRAVENOUS | Status: AC | PRN
  Administered 2018-10-15: 60 mL via INTRAVENOUS

## 2018-10-15 MED ORDER — SENNOSIDES-DOCUSATE SODIUM 8.6-50 MG PO TABS: 1.0000 | ORAL_TABLET | Freq: Every evening | ORAL | Status: DC | PRN

## 2018-10-15 MED ORDER — HYDROCHLOROTHIAZIDE 12.5 MG PO CAPS
12.5000 mg | ORAL_CAPSULE | Freq: Every day | ORAL | Status: DC
  Administered 2018-10-16: 11:00:00 12.5 mg via ORAL
  Filled 2018-10-15: qty 1

## 2018-10-15 MED ORDER — ASPIRIN 81 MG PO CHEW
324.0000 mg | CHEWABLE_TABLET | Freq: Once | ORAL | Status: AC
  Administered 2018-10-15: 324 mg via ORAL
  Filled 2018-10-15: qty 4

## 2018-10-15 MED ORDER — SIMVASTATIN 20 MG PO TABS
10.0000 mg | ORAL_TABLET | Freq: Every day | ORAL | Status: DC
  Administered 2018-10-15: 10 mg via ORAL
  Filled 2018-10-15: qty 1

## 2018-10-15 MED ORDER — ADULT MULTIVITAMIN W/MINERALS CH
1.0000 | ORAL_TABLET | Freq: Every day | ORAL | Status: DC
  Administered 2018-10-16: 11:00:00 1 via ORAL
  Filled 2018-10-15: qty 1

## 2018-10-15 NOTE — Plan of Care (Signed)
  Problem: Education: Goal: Knowledge of General Education information will improve Description Including pain rating scale, medication(s)/side effects and non-pharmacologic comfort measures 10/15/2018 2335 by Wallace Going, RN Outcome: Progressing 10/15/2018 2335 by Wallace Going, RN Outcome: Progressing   Problem: Health Behavior/Discharge Planning: Goal: Ability to manage health-related needs will improve 10/15/2018 2335 by Wallace Going, RN Outcome: Progressing 10/15/2018 2335 by Wallace Going, RN Outcome: Progressing   Problem: Clinical Measurements: Goal: Ability to maintain clinical measurements within normal limits will improve 10/15/2018 2335 by Wallace Going, RN Outcome: Progressing 10/15/2018 2335 by Wallace Going, RN Outcome: Progressing Goal: Will remain free from infection 10/15/2018 2335 by Wallace Going, RN Outcome: Progressing 10/15/2018 2335 by Wallace Going, RN Outcome: Progressing Goal: Diagnostic test results will improve 10/15/2018 2335 by Wallace Going, RN Outcome: Progressing 10/15/2018 2335 by Wallace Going, RN Outcome: Progressing Goal: Respiratory complications will improve 10/15/2018 2335 by Wallace Going, RN Outcome: Progressing 10/15/2018 2335 by Wallace Going, RN Outcome: Progressing Goal: Cardiovascular complication will be avoided 10/15/2018 2335 by Wallace Going, RN Outcome: Progressing 10/15/2018 2335 by Wallace Going, RN Outcome: Progressing   Problem: Activity: Goal: Risk for activity intolerance will decrease 10/15/2018 2335 by Wallace Going, RN Outcome: Progressing 10/15/2018 2335 by Wallace Going, RN Outcome: Progressing   Problem: Elimination: Goal: Will not experience complications related to bowel motility 10/15/2018 2335 by Wallace Going, RN Outcome: Progressing 10/15/2018 2335 by Wallace Going, RN Outcome: Progressing Goal: Will not experience complications related to urinary  retention 10/15/2018 2335 by Wallace Going, RN Outcome: Progressing 10/15/2018 2335 by Wallace Going, RN Outcome: Progressing   Problem: Pain Managment: Goal: General experience of comfort will improve 10/15/2018 2335 by Wallace Going, RN Outcome: Progressing 10/15/2018 2335 by Wallace Going, RN Outcome: Progressing   Problem: Safety: Goal: Ability to remain free from injury will improve 10/15/2018 2335 by Wallace Going, RN Outcome: Progressing 10/15/2018 2335 by Wallace Going, RN Outcome: Progressing   Problem: Skin Integrity: Goal: Risk for impaired skin integrity will decrease 10/15/2018 2335 by Wallace Going, RN Outcome: Progressing 10/15/2018 2335 by Wallace Going, RN Outcome: Progressing   Problem: Education: Goal: Knowledge of disease or condition will improve 10/15/2018 2335 by Wallace Going, RN Outcome: Progressing 10/15/2018 2335 by Wallace Going, RN Outcome: Progressing Goal: Knowledge of secondary prevention will improve 10/15/2018 2335 by Wallace Going, RN Outcome: Progressing 10/15/2018 2335 by Wallace Going, RN Outcome: Progressing Goal: Knowledge of patient specific risk factors addressed and post discharge goals established will improve 10/15/2018 2335 by Wallace Going, RN Outcome: Progressing 10/15/2018 2335 by Wallace Going, RN Outcome: Progressing   Problem: Health Behavior/Discharge Planning: Goal: Ability to manage health-related needs will improve 10/15/2018 2335 by Wallace Going, RN Outcome: Progressing 10/15/2018 2335 by Wallace Going, RN Outcome: Progressing

## 2018-10-15 NOTE — ED Notes (Signed)
Pt states that she has approximately "25% of vision back in right eye".

## 2018-10-15 NOTE — ED Notes (Signed)
Patient transported to CT 

## 2018-10-15 NOTE — ED Notes (Addendum)
FIRST NURSE NOTE:  Received phone call from Dr. Raynald Kemp reports pt had sudden onset of right side vision loss around 1600 and presented to [facility] around 1630.  Pt has has central retinal artery occlusion. Pt sent to ED for CVA work up and needs CBC, CMP, sed rate, carotid dopplers and possible echo per Dr. Raynald Kemp.  Pt continues report complete vision loss on the right.

## 2018-10-15 NOTE — ED Triage Notes (Signed)
PT arrived with husband after sudden loss of vision in her right eye at 1600 today. Pt was seen at the eye doctor and was told she has central retinal artery occulusion. Pt remains unable to see out of her right eye. Pt denies pain.

## 2018-10-15 NOTE — ED Provider Notes (Signed)
[facility] Emergency Department Provider Note ____________________________________________   First MD Initiated Contact with Patient 10/15/18 1740     (approximate)  I have reviewed the triage vital signs and the nursing notes.   HISTORY  Chief Complaint Code Stroke    HPI Tammy [name] is a 72 y.o. female with PMH as noted below who presents with right eye vision loss, acute onset 2 hours ago at 4 PM and now somewhat improving, and not associated with other acute symptoms.  The patient states that she felt slightly dizzy when it first started.  She denies any weakness or numbness.  She has no headache or eye pain.  No prior history of this symptom.  The patient went to see the ophthalmologist right after the symptoms started and was diagnosed with a CRAO and referred to the emergency department for further evaluation and work-up.   Past Medical History:  Diagnosis Date  . Allergic rhinitis   . Chronic kidney disease   . Chronic lymphocytic leukemia ([place])   . DA (degenerative arthritis)   . Diabetes mellitus   . Headache   . Heart murmur   . History of kidney stones   . Hyperlipidemia   . Hypertension   . Hypertriglyceridemia   . Iron deficiency anemia   . Other elevated white blood cell count   . Unspecified deficiency anemia     Patient Active Problem List   Diagnosis Date Noted  . CLL (chronic lymphocytic leukemia) ([place]) 06/03/2015  . Iron deficiency anemia 06/03/2015  . 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 ABDOMINAL HYSTERECTOMY     50 years  . COLONOSCOPY  2008   most recent  . [place]   lask  . JOINT REPLACEMENT    . JOINT REPLACEMENT    . KNEE ARTHROTOMY Left 05/15/2018   Procedure: KNEE ARTHROTOMY,LYSIS OF ADHESIONS, POLY EXCHANGE;  Surgeon: Dereck Leep, MD;  Location: [facility] ORS;  Service: Orthopedics;  Laterality: Left;  . OOPHORECTOMY    . REFRACTIVE  SURGERY Bilateral 1996  . REPLACEMENT TOTAL KNEE Right 2015  . REPLACEMENT TOTAL KNEE Left 2007    Prior to Admission medications   Medication Sig Start Date End Date Taking? Authorizing Provider  acetaminophen (TYLENOL) 500 MG tablet Take 1,000 mg by mouth every 6 (six) hours as needed for moderate pain or headache.   Yes [provider]  amLODipine (NORVASC) 5 MG tablet Take 5 mg by mouth every evening.    Yes [provider]  benazepril-hydrochlorthiazide (LOTENSIN HCT) 20-12.5 MG per tablet Take 1 tablet by mouth daily.    Yes [provider]  Cholecalciferol 5000 units capsule Take 5,000 Units by mouth daily.    Yes [provider]  ferrous sulfate 325 (65 FE) MG tablet Take 325 mg by mouth daily with breakfast.   Yes [provider]  glimepiride (AMARYL) 4 MG tablet Take 4 mg by mouth 2 (two) times daily.    Yes [provider]  meclizine (ANTIVERT) 25 MG tablet Take 25 mg by mouth 3 (three) times daily as needed for dizziness. 10/07/18  Yes [provider]  Multiple Vitamins-Minerals (CEROVITE SENIOR) TABS Take 1 tablet by mouth daily.   Yes [provider]  pioglitazone (ACTOS) 45 MG tablet Take 45 mg by mouth daily.    Yes [provider]  simvastatin (ZOCOR) 10 MG tablet Take 10 mg by  mouth at bedtime.  04/12/18  Yes [provider]  sitaGLIPtin (JANUVIA) 50 MG tablet Take 50 mg by mouth daily.    Yes [provider]  oxyCODONE (OXY IR/ROXICODONE) 5 MG immediate release tablet Take 1 tablet (5 mg total) by mouth every 4 (four) hours as needed for moderate pain (pain score 4-6). Patient not taking: Reported on 10/15/2018 05/16/18   Watt Climes, PA  traMADol (ULTRAM) 50 MG tablet Take 1-2 tablets (50-100 mg total) by mouth every 4 (four) hours as needed for moderate pain. Patient not taking: Reported on 10/15/2018 05/18/18   Watt Climes, PA    Allergies Latex and Sulfa  antibiotics  Family History  Problem Relation Age of Onset  . Cancer Father        gastric cancer  . Cancer Sister        gastric cancer  . Diabetes Mother   . Hypertension Mother   . Diabetes Brother   . Diabetes Brother   . Diabetes Sister   . Breast cancer Neg Hx     Social History Social History   Tobacco Use  . Smoking status: Never Smoker  . Smokeless tobacco: Never Used  Substance Use Topics  . Alcohol use: No  . Drug use: No    Review of Systems  Constitutional: No fever. Eyes: Positive for right eye vision loss. ENT: No neck pain. Cardiovascular: Denies chest pain. Respiratory: Denies shortness of breath. Gastrointestinal: No vomiting.  Genitourinary: Negative for flank pain.  Musculoskeletal: Negative for back pain. Skin: Negative for rash. Neurological: Negative for headaches, focal weakness or numbness.   ____________________________________________   PHYSICAL EXAM:  VITAL SIGNS: ED Triage Vitals  Enc Vitals Group     BP 10/15/18 1743 (!) 181/72     Pulse Rate 10/15/18 1743 80     Resp 10/15/18 1743 (!) 28     Temp 10/15/18 1743 97.7 F (36.5 C)     Temp Source 10/15/18 1743 Oral     SpO2 10/15/18 1743 100 %     Weight 10/15/18 1739 268 lb 11.2 oz (121.9 kg)     Height 10/15/18 1739 5\' 7"  (1.702 m)     Head Circumference --      Peak Flow --      Pain Score 10/15/18 1741 0     Pain Loc --      Pain Edu? --      Excl. in [place]? --     Constitutional: Alert and oriented. Well appearing and in no acute distress. Eyes: Conjunctivae are normal.  EOMI.  Right pupil dilated (from drops given in office).  Left pupil reactive. Head: Atraumatic. Nose: No congestion/rhinnorhea. Mouth/Throat: Mucous membranes are moist.   Neck: Normal range of motion.  Cardiovascular: Normal rate, regular rhythm. Grossly normal heart sounds.  Good peripheral circulation. Respiratory: Normal respiratory effort.  No retractions. Lungs CTAB. Gastrointestinal: Soft  and nontender. No distention.  Genitourinary: No flank tenderness. Musculoskeletal: No lower extremity edema.  Extremities warm and well perfused.  Neurologic:  Normal speech and language.  5/5 motor motor strength and sensory intact in all extremities.  No facial droop.  Normal coordination with no ataxia.  No pronator drift. Skin:  Skin is warm and dry. No rash noted. Psychiatric: Mood and affect are normal. Speech and behavior are normal.  ____________________________________________   LABS (all labs ordered are listed, but only abnormal results are displayed)  Labs Reviewed  CBC - Abnormal; Notable for the following  components:      Result Value   WBC 40.9 (*)    RBC 3.73 (*)    Hemoglobin 11.3 (*)    All other components within normal limits  DIFFERENTIAL - Abnormal; Notable for the following components:   Lymphs Abs 34.7 (*)    Abs Immature Granulocytes 0.15 (*)    All other components within normal limits  COMPREHENSIVE METABOLIC PANEL - Abnormal; Notable for the following components:   Glucose, Bld 217 (*)    BUN 33 (*)    Creatinine, Ser 1.54 (*)    GFR calc non Af Amer 34 (*)    GFR calc Af Amer 39 (*)    All other components within normal limits  GLUCOSE, CAPILLARY - Abnormal; Notable for the following components:   Glucose-Capillary 187 (*)    All other components within normal limits  PROTIME-INR  APTT  TROPONIN I  SEDIMENTATION RATE  C-REACTIVE PROTEIN  PATHOLOGIST SMEAR REVIEW  CBG MONITORING, ED   ____________________________________________  EKG  ED ECG REPORT I, Arta Silence, the attending physician, personally viewed and interpreted this ECG.  Date: 10/15/2018 EKG Time: 1744 Rate: 81 Rhythm: normal sinus rhythm Intervals: LBBB ST/T Wave abnormalities: normal Narrative Interpretation: no evidence of acute ischemia; no significant change when compared to EKG of 05/03/2018  ____________________________________________  RADIOLOGY  CT  head: No ICH or acute stroke  ____________________________________________   PROCEDURES  Procedure(s) performed: No  Procedures  Critical Care performed: Yes  CRITICAL CARE Performed by: Arta Silence   Total critical care time: 30 minutes  Critical care time was exclusive of separately billable procedures and treating other patients.  Critical care was necessary to treat or prevent imminent or life-threatening deterioration.  Critical care was time spent personally by me on the following activities: development of treatment plan with patient and/or surrogate as well as nursing, discussions with consultants, evaluation of patient's response to treatment, examination of patient, obtaining history from patient or surrogate, ordering and performing treatments and interventions, ordering and review of laboratory studies, ordering and review of radiographic studies, pulse oximetry and re-evaluation of patient's condition. ____________________________________________   INITIAL IMPRESSION / ASSESSMENT AND PLAN / ED COURSE  Pertinent labs & imaging results that were available during my care of the patient were reviewed by me and considered in my medical decision making (see chart for details).  72 year old female with PMH as noted above presents with acute onset of right eye vision loss less than 2 hours ago.  The patient went to the ophthalmologist and was diagnosed with a CRAO and referred to the emergency department for further work-up and evaluation.  On exam the patient still has decreased vision on the right side and states she is able to see shapes and colors.  The remainder of her exam is unremarkable and the rest of her neuro exam is nonfocal.    I reviewed the past medical records in Epic; the patient was most recently admitted in June of this year for knee arthroplasty.  Given the short duration since the onset of symptoms, code stroke was activated.  CT head showed no  acute findings.  The patient is being evaluated by the tele-neurologist.  I also consulted with Dr. Murvin Natal from ophthalmology who had evaluated the patient.  He advised that the patient requires stroke work-up but no specific ophthalmologic interventions at this time.  ----------------------------------------- 7:29 PM on 10/15/2018 -----------------------------------------  Tele-neurology recommended a CT Angie of the head and neck which I obtained.  Patient's symptoms continue to slowly improve.  She is otherwise stable for admission at this time.  I signed her out to the hospitalist Dr. Marcille Blanco. ____________________________________________   FINAL CLINICAL IMPRESSION(S) / ED DIAGNOSES  Final diagnoses:  Central retinal artery occlusion of right eye      NEW MEDICATIONS STARTED DURING THIS VISIT:  New Prescriptions   No medications on file     Note:  This document was prepared using Dragon voice recognition software and may include unintentional dictation errors.    Arta Silence, MD 10/15/18 1930

## 2018-10-15 NOTE — Progress Notes (Signed)
Code Stroke. Offered emotional and spiritual support to [place] and husband during evaluation. Prayer offered upon request from [place]. Tammy [name] and husband are in good spirits and find humor comforting. Chaplain continues to be available upon request.     10/15/18 1800  Clinical Encounter Type  Visited With Patient and family together  Visit Type Initial;ED  Referral From Nurse  Spiritual Encounters  Spiritual Needs Prayer

## 2018-10-15 NOTE — Consult Note (Signed)
[facility] Tele[facility]urology Consult Services   Date of Service:   10/15/2018 17:54:06  Impression:     .  CRAO  Comments: 72 YO F with PMH significant for HTN and DM presented with sudden onset right eye vision loss likely Central/branch Retinal artery occlusion.  Metrics: Last Known Well: 10/15/2018 16:00:00 [facility] Notification Time: 10/15/2018 17:53:30 Arrival Time: 10/15/2018 17:27:00 Stamp Time: 10/15/2018 17:54:06 Time First Login Attempt: 10/15/2018 17:56:36 Video Start Time: 10/15/2018 17:56:36  Symptoms: Sudden onset vision loss in right eye NIHSS Start Assessment Time: 10/15/2018 17:59:36 Patient is not a candidate for tPA. Patient was not deemed candidate for tPA thrombolytics because of Probable CRAO. TPA was offered but not given. . Video End Time: 10/15/2018 18:05:51  CT head showed no acute hemorrhage or acute core infarct.  Advanced imaging CTA head and neck obtained.   Radiologist was not called back for review of advanced imaging because Pending ER Physician notified of the decision on thrombolytics management on 10/15/2018 18:05:55  Our recommendations are outlined below.  Recommendations:     .  Activate Stroke Protocol Admission/Order Set     .  Stroke/Telemetry Floor     .  Neuro Checks     .  Bedside Swallow Eval     .  DVT Prophylaxis     .  IV Fluids, Normal Saline     .  Head of Bed Below 30 Degrees     .  Euglycemia and Avoid Hyperthermia (PRN Acetaminophen)     .  Initiate Aspirin 81 MG Daily  Routine Consultation with [facility] for Follow up Care  Sign Out:     .  Discussed with Emergency Department Provider    ------------------------------------------------------------------------------  History of Present Illness: Patient is a 72 year old Female.  Patient was brought by private transportation with symptoms of Sudden onset vision loss in right eye  72 YO F with PMH significant for HTN and DM  presented with sudden onset right eye vision loss. She states that her symptoms started suddenly at 4 PM. She went to see eye doctor and was advised to come to hospital. She states that her left eye is fine and she can see light and finger movement in the right eye. Denies any headache.  CT head showed no acute hemorrhage or acute core infarct.    Examination: BP(181/72), Blood Glucose(187) 1A: Level of Consciousness - Alert; keenly responsive + 0 1B: Ask Month and Age - Both Questions Right + 0 1C: Blink Eyes & Squeeze Hands - Performs Both Tasks + 0 2: Test Horizontal Extraocular Movements - Normal + 0 3: Test Visual Fields - Complete Hemianopia + 2 4: Test Facial Palsy (Use Grimace if Obtunded) - Normal symmetry + 0 5A: Test Left Arm Motor Drift - No Drift for 10 Seconds + 0 5B: Test Right Arm Motor Drift - No Drift for 10 Seconds + 0 6A: Test Left Leg Motor Drift - No Drift for 5 Seconds + 0 6B: Test Right Leg Motor Drift - No Drift for 5 Seconds + 0 7: Test Limb Ataxia (FNF/Heel-Shin) - No Ataxia + 0 8: Test Sensation - Normal; No sensory loss + 0 9: Test Language/Aphasia - Normal; No aphasia + 0 10: Test Dysarthria - Normal + 0 11: Test Extinction/Inattention - No abnormality + 0  NIHSS Score: 2  Patient was informed the Neurology Consult would happen via TeleHealth consult by way of interactive audio and video telecommunications and consented to receiving  care in this manner.  Due to the immediate potential for life-threatening deterioration due to underlying acute neurologic illness, I spent 35 minutes providing critical care. This time includes time for face to face visit via telemedicine, review of medical records, imaging studies and discussion of findings with providers, the patient and/or family.   Dr Beau Fanny   [facility] [phone]  Case [id number]

## 2018-10-15 NOTE — ED Notes (Signed)
CODE STROKE CALLED TO 333 

## 2018-10-15 NOTE — Progress Notes (Signed)
CODE STROKE- PHARMACY COMMUNICATION   Time CODE STROKE called/page received: 17:29  Time response to CODE STROKE was made (in person or via phone):   Time Stroke Kit retrieved from [facility] (only if needed): No TPA  Name of Provider/Nurse contacted: unknown  Past Medical History:  Diagnosis Date  . Allergic rhinitis   . Chronic kidney disease   . Chronic lymphocytic leukemia ([place])   . DA (degenerative arthritis)   . Diabetes mellitus   . Headache   . Heart murmur   . History of kidney stones   . Hyperlipidemia   . Hypertension   . Hypertriglyceridemia   . Iron deficiency anemia   . Other elevated white blood cell count   . Unspecified deficiency anemia    Prior to Admission medications   Medication Sig Start Date End Date Taking? Authorizing Provider  acetaminophen (TYLENOL) 500 MG tablet Take 1,000 mg by mouth every 6 (six) hours as needed for moderate pain or headache.   Yes [provider]  amLODipine (NORVASC) 5 MG tablet Take 5 mg by mouth every evening.    Yes [provider]  benazepril-hydrochlorthiazide (LOTENSIN HCT) 20-12.5 MG per tablet Take 1 tablet by mouth daily.    Yes [provider]  Cholecalciferol 5000 units capsule Take 5,000 Units by mouth daily.    Yes [provider]  ferrous sulfate 325 (65 FE) MG tablet Take 325 mg by mouth daily with breakfast.   Yes [provider]  glimepiride (AMARYL) 4 MG tablet Take 4 mg by mouth 2 (two) times daily.    Yes [provider]  meclizine (ANTIVERT) 25 MG tablet Take 25 mg by mouth 3 (three) times daily as needed for dizziness. 10/07/18  Yes [provider]  Multiple Vitamins-Minerals (CEROVITE SENIOR) TABS Take 1 tablet by mouth daily.   Yes [provider]  pioglitazone (ACTOS) 45 MG tablet Take 45 mg by mouth daily.    Yes [provider]  simvastatin (ZOCOR) 10 MG tablet Take 10 mg by mouth at bedtime.  04/12/18  Yes [provider]  sitaGLIPtin (JANUVIA) 50 MG tablet Take 50 mg by mouth daily.    Yes [provider]  oxyCODONE (OXY IR/ROXICODONE) 5 MG immediate release tablet Take 1 tablet (5 mg total) by mouth every 4 (four) hours as needed for moderate pain (pain score 4-6). Patient not taking: Reported on 10/15/2018 05/16/18   Watt Climes, PA  traMADol (ULTRAM) 50 MG tablet Take 1-2 tablets (50-100 mg total) by mouth every 4 (four) hours as needed for moderate pain. Patient not taking: Reported on 10/15/2018 05/18/18   Watt Climes, PA  DILT-XR 180 MG 24 hr capsule Take 180 mg by mouth daily. 09/22/18 10/15/18  [provider]  diphenhydramine-acetaminophen (TYLENOL PM) 25-500 MG TABS tablet Take 1 tablet by mouth at bedtime as needed.   10/15/18  [provider]  verapamil (CALAN-SR) 180 MG CR tablet Take 360 mg by mouth daily.   10/15/18  [provider]    Laural Benes ,PharmD, BCPS Clinical Pharmacist  10/15/2018  7:16 PM

## 2018-10-15 NOTE — H&P (Signed)
Tammy [name] is an 72 y.o. female.   Chief Complaint: Code stroke HPI: The patient with past medical history of CKD, diabetes and hypertension presents to the emergency department from her ophthalmologist office after developing eye pain and loss of vision in her right eye.  Her symptoms were consistent with central retinal artery occlusion and her ophthalmologist sent her directly to the emergency department where the patient underwent a CTA of the head.  She declined thrombolytic therapy.  Patient denies numbness or weakness in her extremities, difficulty swallowing or speaking, palpitations or nausea or vomiting.  As her symptoms do not completely resolve the emergency department staff called the hospitalist service for further evaluation.  Past Medical History:  Diagnosis Date  . Allergic rhinitis   . Chronic kidney disease   . Chronic lymphocytic leukemia ([place])   . DA (degenerative arthritis)   . Diabetes mellitus   . Headache   . Heart murmur   . History of kidney stones   . Hyperlipidemia   . Hypertension   . Hypertriglyceridemia   . Iron deficiency anemia   . Other elevated white blood cell count   . Unspecified deficiency anemia     Past Surgical History:  Procedure Laterality Date  . ABDOMINAL HYSTERECTOMY     50 years  . COLONOSCOPY  2008   most recent  . [place]   lask  . JOINT REPLACEMENT    . JOINT REPLACEMENT    . KNEE ARTHROTOMY Left 05/15/2018   Procedure: KNEE ARTHROTOMY,LYSIS OF ADHESIONS, POLY EXCHANGE;  Surgeon: Dereck Leep, MD;  Location: [facility] ORS;  Service: Orthopedics;  Laterality: Left;  . OOPHORECTOMY    . REFRACTIVE SURGERY Bilateral 1996  . REPLACEMENT TOTAL KNEE Right 2015  . REPLACEMENT TOTAL KNEE Left 2007    Family History  Problem Relation Age of Onset  . Cancer Father        gastric cancer  . Cancer Sister        gastric cancer  . Diabetes Mother   . Hypertension Mother   . Diabetes Brother   . Diabetes Brother   .  Diabetes Sister   . Breast cancer Neg Hx    Social History:  reports that she has never smoked. She has never used smokeless tobacco. She reports that she does not drink alcohol or use drugs.  Allergies:  Allergies  Allergen Reactions  . Latex Rash  . Sulfa Antibiotics Rash    Prior to Admission medications   Medication Sig Start Date End Date Taking? Authorizing Provider  acetaminophen (TYLENOL) 500 MG tablet Take 1,000 mg by mouth every 6 (six) hours as needed for moderate pain or headache.   Yes [provider]  amLODipine (NORVASC) 5 MG tablet Take 5 mg by mouth every evening.    Yes [provider]  benazepril-hydrochlorthiazide (LOTENSIN HCT) 20-12.5 MG per tablet Take 1 tablet by mouth daily.    Yes [provider]  Cholecalciferol 5000 units capsule Take 5,000 Units by mouth daily.    Yes [provider]  ferrous sulfate 325 (65 FE) MG tablet Take 325 mg by mouth daily with breakfast.   Yes [provider]  glimepiride (AMARYL) 4 MG tablet Take 4 mg by mouth 2 (two) times daily.    Yes [provider]  meclizine (ANTIVERT) 25 MG tablet Take 25 mg by mouth 3 (three) times daily as needed for dizziness. 10/07/18  Yes [provider]  Multiple Vitamins-Minerals (  CEROVITE SENIOR) TABS Take 1 tablet by mouth daily.   Yes [provider]  pioglitazone (ACTOS) 45 MG tablet Take 45 mg by mouth daily.    Yes [provider]  simvastatin (ZOCOR) 10 MG tablet Take 10 mg by mouth at bedtime.  04/12/18  Yes [provider]  sitaGLIPtin (JANUVIA) 50 MG tablet Take 50 mg by mouth daily.    Yes [provider]     Results for orders placed or performed during the hospital encounter of 10/15/18 (from the past 48 hour(s))  Glucose, capillary     Status: Abnormal   Collection Time: 10/15/18  5:41 PM  Result Value Ref Range   Glucose-Capillary 187 (H) 70 - 99 mg/dL  Protime-INR     Status: None    Collection Time: 10/15/18  5:42 PM  Result Value Ref Range   Prothrombin Time 13.6 11.4 - 15.2 seconds   INR 1.05     Comment: Performed at [facility], [address] [place], [place] [address]  APTT     Status: None   Collection Time: 10/15/18  5:42 PM  Result Value Ref Range   aPTT 27 24 - 36 seconds    Comment: Performed at [facility], [address] [place], [place] [address]  CBC     Status: Abnormal   Collection Time: 10/15/18  5:42 PM  Result Value Ref Range   WBC 40.9 (H) 4.0 - 10.5 K/uL   RBC 3.73 (L) 3.87 - 5.11 MIL/uL   Hemoglobin 11.3 (L) 12.0 - 15.0 g/dL   HCT 36.2 36.0 - 46.0 %   MCV 97.1 80.0 - 100.0 fL   MCH 30.3 26.0 - 34.0 pg   MCHC 31.2 30.0 - 36.0 g/dL   RDW 14.9 11.5 - 15.5 %   Platelets 189 150 - 400 K/uL   nRBC 0.0 0.0 - 0.2 %    Comment: Performed at [facility], [address] [place], [place] [address]  Differential     Status: Abnormal   Collection Time: 10/15/18  5:42 PM  Result Value Ref Range   Neutrophils Relative % 14 %   Neutro Abs 5.6 1.7 - 7.7 K/uL   Lymphocytes Relative 85 %   Lymphs Abs 34.7 (H) 0.7 - 4.0 K/uL   Monocytes Relative 1 %   Monocytes Absolute 0.4 0.1 - 1.0 K/uL   Eosinophils Relative 0 %   Eosinophils Absolute 0.1 0.0 - 0.5 K/uL   Basophils Relative 0 %   Basophils Absolute 0.0 0.0 - 0.1 K/uL   WBC Morphology ABSOLUTE LYMPHOCYTOSIS    RBC Morphology MORPHOLOGY UNREMARKABLE    Smear Review Normal platelet morphology    Immature Granulocytes 0 %   Abs Immature Granulocytes 0.15 (H) 0.00 - 0.07 K/uL    Comment: Performed at [facility], [address] [place], [place] [address]  Comprehensive metabolic panel     Status: Abnormal   Collection Time: 10/15/18  5:42 PM  Result Value Ref Range   Sodium 139 135 - 145 mmol/L   Potassium 4.2 3.5 - 5.1 mmol/L   Chloride 102 98 - 111 mmol/L   CO2 26 22 - 32 mmol/L   Glucose, Bld 217 (H) 70 - 99 mg/dL   BUN 33 (H) 8 - 23 mg/dL    Creatinine, Ser 1.54 (H) 0.44 - 1.00 mg/dL   Calcium 9.2 8.9 - 10.3 mg/dL   Total Protein 7.2 6.5 - 8.1 g/dL   Albumin  4.3 3.5 - 5.0 g/dL   AST 22 15 - 41 U/L   ALT 18 0 - 44 U/L   Alkaline Phosphatase 68 38 - 126 U/L   Total Bilirubin 0.6 0.3 - 1.2 mg/dL   GFR calc non Af Amer 34 (L) >60 mL/min   GFR calc Af Amer 39 (L) >60 mL/min   Anion gap 11 5 - 15    Comment: Performed at [facility], [address] [place], [place] [address]  Troponin I - Once     Status: None   Collection Time: 10/15/18  5:42 PM  Result Value Ref Range   Troponin I <0.03 <0.03 ng/mL    Comment: Performed at [facility], [address] [place], [place] [address]  Sedimentation rate     Status: None   Collection Time: 10/15/18  9:07 PM  Result Value Ref Range   Sed Rate 28 0 - 30 mm/hr    Comment: Performed at [facility], [address] [place], [place] [address]  Lipid panel     Status: None   Collection Time: 10/15/18  9:07 PM  Result Value Ref Range   Cholesterol 134 0 - 200 mg/dL   Triglycerides 67 <150 mg/dL   HDL 54 >40 mg/dL   Total CHOL/HDL Ratio 2.5 RATIO   VLDL 13 0 - 40 mg/dL   LDL Cholesterol 67 0 - 99 mg/dL    Comment:        Total Cholesterol/HDL:CHD Risk Coronary Heart Disease Risk Table                     Men   Women  1/2 Average Risk   3.4   3.3  Average Risk       5.0   4.4  2 X Average Risk   9.6   7.1  3 X Average Risk  23.4   11.0        Use the calculated Patient Ratio above and the CHD Risk Table to determine the patient's CHD Risk.        ATP III CLASSIFICATION (LDL):  <100     mg/dL   Optimal  100-129  mg/dL   Near or Above                    Optimal  130-159  mg/dL   Borderline  160-189  mg/dL   High  >190     mg/dL   Very High Performed at [facility], [address] [place],  [address]   Glucose, capillary     Status: Abnormal   Collection Time: 10/15/18  9:25 PM  Result Value Ref Range    Glucose-Capillary 276 (H) 70 - 99 mg/dL   Ct Angio Head W Or Wo Contrast  Result Date: 10/15/2018 CLINICAL DATA:  72 y/o F; sudden onset of right-sided vision loss. Central retinal artery occlusion. EXAM: CT ANGIOGRAPHY HEAD AND NECK TECHNIQUE: Multidetector CT imaging of the head and neck was performed using the standard protocol during bolus administration of intravenous contrast. Multiplanar CT image reconstructions and MIPs were obtained to evaluate the vascular anatomy. Carotid stenosis measurements (when applicable) are obtained utilizing NASCET criteria, using the distal internal carotid diameter as the denominator. CONTRAST:  29mL OMNIPAQUE IOHEXOL 350 MG/ML SOLN COMPARISON:  10/15/2018 CT head. FINDINGS: CTA NECK FINDINGS Aortic arch: Bovine variant branching. Imaged portion shows no evidence of aneurysm or dissection. No significant stenosis of the major  arch vessel origins. Mild calcific atherosclerosis of the aorta. Right carotid system: No evidence of dissection, stenosis (50% or greater) or occlusion. Left carotid system: Calcified plaque of the left carotid bifurcation with mild to moderate 50% proximal ICA stenosis. No dissection or aneurysm. No dissection or aneurysm. Vertebral arteries: Left dominant. No evidence of dissection, stenosis (50% or greater) or occlusion. Skeleton: C5-6 moderate loss of intervertebral disc space height and endplate marginal osteophytes. Other neck: 16 mm nodule within the left lobe of the thyroid gland. Upper chest: Negative. Review of the MIP images confirms the above findings CTA HEAD FINDINGS Anterior circulation: No significant stenosis, proximal occlusion, aneurysm, or vascular malformation. Calcific atherosclerosis of carotid siphons without significant stenosis. Posterior circulation: No significant stenosis, proximal occlusion, aneurysm, or vascular malformation. Venous sinuses: As permitted by contrast timing, patent. Anatomic variants: Complete  circle-of-Willis. Azygos left A2. Delayed phase: No abnormal intracranial enhancement. Review of the MIP images confirms the above findings IMPRESSION: CTA neck: 1. Left proximal ICA mild-to-moderate 50% stenosis with calcified plaque. 2. Patent carotid and vertebral arteries. No dissection, aneurysm, or additional segment of hemodynamically significant stenosis utilizing NASCET criteria. 3. 16 mm nodule within the left lobe of thyroid gland. Further evaluation with thyroid ultrasound is recommended on a nonemergent basis. CTA head: 1. Patent anterior and posterior intracranial circulation. No large vessel occlusion, aneurysm, or significant stenosis. 2. Mild non stenotic calcific atherosclerosis of the carotid siphons. Electronically Signed   By: Kristine Garbe M.D.   On: 10/15/2018 19:14   Ct Angio Neck W And/or Wo Contrast  Result Date: 10/15/2018 CLINICAL DATA:  72 y/o F; sudden onset of right-sided vision loss. Central retinal artery occlusion. EXAM: CT ANGIOGRAPHY HEAD AND NECK TECHNIQUE: Multidetector CT imaging of the head and neck was performed using the standard protocol during bolus administration of intravenous contrast. Multiplanar CT image reconstructions and MIPs were obtained to evaluate the vascular anatomy. Carotid stenosis measurements (when applicable) are obtained utilizing NASCET criteria, using the distal internal carotid diameter as the denominator. CONTRAST:  81mL OMNIPAQUE IOHEXOL 350 MG/ML SOLN COMPARISON:  10/15/2018 CT head. FINDINGS: CTA NECK FINDINGS Aortic arch: Bovine variant branching. Imaged portion shows no evidence of aneurysm or dissection. No significant stenosis of the major arch vessel origins. Mild calcific atherosclerosis of the aorta. Right carotid system: No evidence of dissection, stenosis (50% or greater) or occlusion. Left carotid system: Calcified plaque of the left carotid bifurcation with mild to moderate 50% proximal ICA stenosis. No dissection or  aneurysm. No dissection or aneurysm. Vertebral arteries: Left dominant. No evidence of dissection, stenosis (50% or greater) or occlusion. Skeleton: C5-6 moderate loss of intervertebral disc space height and endplate marginal osteophytes. Other neck: 16 mm nodule within the left lobe of the thyroid gland. Upper chest: Negative. Review of the MIP images confirms the above findings CTA HEAD FINDINGS Anterior circulation: No significant stenosis, proximal occlusion, aneurysm, or vascular malformation. Calcific atherosclerosis of carotid siphons without significant stenosis. Posterior circulation: No significant stenosis, proximal occlusion, aneurysm, or vascular malformation. Venous sinuses: As permitted by contrast timing, patent. Anatomic variants: Complete circle-of-Willis. Azygos left A2. Delayed phase: No abnormal intracranial enhancement. Review of the MIP images confirms the above findings IMPRESSION: CTA neck: 1. Left proximal ICA mild-to-moderate 50% stenosis with calcified plaque. 2. Patent carotid and vertebral arteries. No dissection, aneurysm, or additional segment of hemodynamically significant stenosis utilizing NASCET criteria. 3. 16 mm nodule within the left lobe of thyroid gland. Further evaluation with thyroid ultrasound is recommended on a  nonemergent basis. CTA head: 1. Patent anterior and posterior intracranial circulation. No large vessel occlusion, aneurysm, or significant stenosis. 2. Mild non stenotic calcific atherosclerosis of the carotid siphons. Electronically Signed   By: Kristine Garbe M.D.   On: 10/15/2018 19:14   Dg Bone Density  Result Date: 10/14/2018 EXAM: DUAL X-RAY ABSORPTIOMETRY (DXA) FOR BONE MINERAL DENSITY IMPRESSION: Dear Wenda Low, Your patient DEISI SALONGA completed a FRAX assessment on 10/14/2018 using the [place] (analysis version: 14.10) manufactured by [employer]. The following summarizes the results of our evaluation. PATIENT  BIOGRAPHICAL: Name: Charron, Coultas Patient ID: [id number] Birth Date: 1946-10-25 Height:    68.0 in. Gender:     [age]    [age]       Weight:    266.0 lbs. Ethnicity:  Black                            Exam Date: 10/14/2018 FRAX* RESULTS:  (version: 3.5) 10-year Probability of Fracture1 Major Osteoporotic Fracture2 Hip Fracture 7.7% 1.8% Population: [place] (Black) Risk Factors: Rheumatoid Arthritis, Family Hist. (Parent hip fracture) Based on Femur (Right) Neck BMD 1 -The 10-year probability of fracture may be lower than reported if the patient has received treatment. 2 -Major Osteoporotic Fracture: Clinical Spine, Forearm, Hip or Shoulder *FRAX is a Materials engineer of the [employer] for Metabolic Bone Disease, a Force (WHO) [facility]. ASSESSMENT: The probability of a major osteoporotic fracture is 7.7% within the next ten years. The probability of a hip fracture is 1.8% within the next ten years. . Technologist: KBD PATIENT BIOGRAPHICAL: Name: Cheyene, Hamric Patient ID: [id number] Birth Date: 08-13-1946 Height: 68.0 in. Gender: Female Exam Date: 10/14/2018 Weight: 266.0 lbs. Indications: Advanced Age, Diabetic, Height Loss, Hysterectomy, Oophorectomy Bilateral, Parent Hip Fracture, Postmenopausal, Rheumatoid Arthritis, Surgical Induced Menopause, Family Hist. (Parent hip fracture) Fractures: Treatments: Vitamin D ASSESSMENT: The BMD measured at Femur Neck Right is 0.868 g/cm2 with a T-score of -1.2. This patient is considered osteopenic according to [place] [facility]) criteria. The quality of this scan is good. Site Region Measured Measured WHO Young Adult BMD Date       Age      Classification T-score AP Spine L1-L4 10/14/2018 [age] Normal 0.9 1.306 g/cm2 DualFemur Neck Right 10/14/2018 71.7 Osteopenia -1.2 0.868 g/cm2 World Health Organization [facility]) criteria for post-menopausal, Caucasian Women: Normal:       T-score at or above  -1 SD Osteopenia:   T-score between -1 and -2.5 SD Osteoporosis: T-score at or below -2.5 SD RECOMMENDATIONS: 1. All patients should optimize calcium and vitamin D intake. 2. Consider FDA-approved medical therapies in postmenopausal women and men aged 23 years and older, based on the following: a. A hip or vertebral(clinical or morphometric) fracture b. T-score < -2.5 at the femoral neck or spine after appropriate evaluation to exclude secondary causes c. Low bone mass (T-score between -1.0 and -2.5 at the femoral neck or spine) and a 10-year probability of a hip fracture > 3% or a 10-year probability of a major osteoporosis-related fracture > 20% based on the [place]-adapted WHO algorithm d. Clinician judgment and/or patient preferences may indicate treatment for people with 10-year fracture probabilities above or below these levels FOLLOW-UP: People with diagnosed cases of osteoporosis or at high risk for fracture should have regular bone mineral density tests. For patients eligible for  Medicare, routine testing is allowed once every 2 years. The testing frequency can be increased to one year for patients who have rapidly progressing disease, those who are receiving or discontinuing medical therapy to restore bone mass, or have additional risk factors. I have reviewed this report, and agree with the above findings. [facility] Electronically Signed   By: Lowella Grip III M.D.   On: 10/14/2018 09:52   Mm 3d Screen Breast Bilateral  Result Date: 10/14/2018 CLINICAL DATA:  Screening. EXAM: DIGITAL SCREENING BILATERAL MAMMOGRAM WITH TOMO AND CAD COMPARISON:  Previous exam(s). ACR Breast Density Category c: The breast tissue is heterogeneously dense, which may obscure small masses. FINDINGS: There are no findings suspicious for malignancy. Images were processed with CAD. IMPRESSION: No mammographic evidence of malignancy. A result letter of this screening mammogram will be mailed directly to the patient.  RECOMMENDATION: Screening mammogram in one year. (Code:SM-B-01Y) BI-RADS CATEGORY  1: Negative. Electronically Signed   By: Margarette [place] M.D.   On: 10/14/2018 09:53   Ct Head Code Stroke Wo Contrast  Result Date: 10/15/2018 CLINICAL DATA:  Code stroke. Sudden onset of right-sided vision loss. Diagnosed with central retinal artery occlusion at ophthalmology office and sent to ED for further workup. EXAM: CT HEAD WITHOUT CONTRAST TECHNIQUE: Contiguous axial images were obtained from the base of the skull through the vertex without intravenous contrast. COMPARISON:  CT head without contrast 10/07/2017 FINDINGS: Brain: Mild atrophy and white matter changes are stable, within normal limits for age. No acute cortical infarct, hemorrhage, or mass lesion is present. Basal ganglia are intact. Insular ribbon is normal. The brainstem and cerebellum are normal. Vascular: Minimal cavernous carotid artery calcifications are present without significant change. There is no asymmetric hyperdensity. Skull: Calvarium is intact. No focal lytic or blastic lesions are present. Sinuses/Orbits: The paranasal sinuses and mastoid air cells are clear. Globes and orbits are unremarkable. ASPECTS [facility] Stroke Program Early CT Score) - Ganglionic level infarction (caudate, lentiform nuclei, internal capsule, insula, M1-M3 cortex): 7/7 - Supraganglionic infarction (M4-M6 cortex): 3/3 Total score (0-10 with 10 being normal): 10/10 IMPRESSION: 1. Normal CT of the head for age. 2. Atherosclerosis 3. ASPECTS is 10/10 These results were called by telephone at the time of interpretation on 10/15/2018 at 5:43 pm to Dr. Nance Pear , who verbally acknowledged these results. Electronically Signed   By: San Morelle M.D.   On: 10/15/2018 17:45    Review of Systems  Constitutional: Negative for chills and fever.  HENT: Negative for sore throat and tinnitus.   Eyes: Positive for blurred vision and pain. Negative for redness.   Respiratory: Negative for cough and shortness of breath.   Cardiovascular: Negative for chest pain, palpitations, orthopnea and PND.  Gastrointestinal: Negative for abdominal pain, diarrhea, nausea and vomiting.  Genitourinary: Negative for dysuria, frequency and urgency.  Musculoskeletal: Negative for joint pain and myalgias.  Skin: Negative for rash.       No lesions  Neurological: Negative for speech change, focal weakness and weakness.  Endo/Heme/Allergies: Does not bruise/bleed easily.       No temperature intolerance  Psychiatric/Behavioral: Negative for depression and suicidal ideas.    Blood pressure (!) 159/72, pulse 73, temperature 97.7 F (36.5 C), temperature source Oral, resp. rate 18, height 5\' 7"  (1.702 m), weight 121.9 kg, SpO2 100 %. Physical Exam  Vitals reviewed. Constitutional: She is oriented to person, place, and time. She appears well-developed and well-nourished. No distress.  HENT:  Head: Normocephalic and atraumatic.  Mouth/Throat: Oropharynx is clear and moist.  Eyes: Pupils are equal, round, and reactive to light. Conjunctivae and EOM are normal. No scleral icterus.  Neck: Normal range of motion. Neck supple. No JVD present. No tracheal deviation present. No thyromegaly present.  Cardiovascular: Normal rate, regular rhythm and normal heart sounds. Exam reveals no gallop and no friction rub.  No murmur heard. Respiratory: Effort normal and breath sounds normal.  GI: Soft. Bowel sounds are normal. She exhibits no distension. There is no tenderness.  Genitourinary:  Genitourinary Comments: Deferred  Musculoskeletal: Normal range of motion. She exhibits no edema.  Lymphadenopathy:    She has no cervical adenopathy.  Neurological: She is alert and oriented to person, place, and time. No cranial nerve deficit. She exhibits normal muscle tone.  Skin: Skin is warm and dry. No rash noted. No erythema.  Psychiatric: She has a normal mood and affect. Her behavior  is normal. Judgment and thought content normal.     Assessment/Plan This is a 72 year old female admitted for central retinal artery occlusion. 1.  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  Systemic anticoagulation found to be of little benefit thus I currently have the patient on prophylactic heparin only.  The patient reports that her eye feels better than when she first arrived.  She describes the discomfort more as irritation than pain.  She also reports that her vision has improved some.  Consult neurology. 2.  CKD: Stage III; avoid nephrotoxic agents 3.  Hypertension: Controlled; continue hydrochlorothiazide 4.  Diabetes mellitus type 2: Hold oral hypoglycemic agents.  Sliding scale insulin while hospitalized 5.  Hyperlipidemia: Continue statin therapy 6.  DVT prophylaxis: Heparin 7.  GI prophylaxis: None The patient is a full code.  Time spent on admission orders and patient care approximately 45 minutes  Harrie Foreman, MD 10/15/2018, 10:25 PM

## 2018-10-16 ENCOUNTER — Inpatient Hospital Stay (HOSPITAL_COMMUNITY)
Admit: 2018-10-16 | Discharge: 2018-10-16 | Disposition: A | Discharge status: Home / Self Care | Payer: Medicare HMO | Attending: Internal Medicine | Admitting: Internal Medicine

## 2018-10-16 ENCOUNTER — Inpatient Hospital Stay: Payer: Medicare HMO

## 2018-10-16 DIAGNOSIS — H3411 Central retinal artery occlusion, right eye: Secondary | ICD-10-CM

## 2018-10-16 DIAGNOSIS — N183 Chronic kidney disease, stage 3 (moderate): Secondary | ICD-10-CM | POA: Diagnosis not present

## 2018-10-16 DIAGNOSIS — E119 Type 2 diabetes mellitus without complications: Secondary | ICD-10-CM | POA: Diagnosis not present

## 2018-10-16 DIAGNOSIS — I6350 Cerebral infarction due to unspecified occlusion or stenosis of unspecified cerebral artery: Secondary | ICD-10-CM | POA: Diagnosis not present

## 2018-10-16 DIAGNOSIS — I10 Essential (primary) hypertension: Secondary | ICD-10-CM | POA: Diagnosis not present

## 2018-10-16 DIAGNOSIS — R29818 Other symptoms and signs involving the nervous system: Secondary | ICD-10-CM | POA: Diagnosis not present

## 2018-10-16 DIAGNOSIS — Z8673 Personal history of transient ischemic attack (TIA), and cerebral infarction without residual deficits: Secondary | ICD-10-CM | POA: Diagnosis not present

## 2018-10-16 DIAGNOSIS — H3410 Central retinal artery occlusion, unspecified eye: Secondary | ICD-10-CM | POA: Diagnosis not present

## 2018-10-16 LAB — ECHOCARDIOGRAM COMPLETE
Height: 67 in
Weight: 4288 oz

## 2018-10-16 LAB — PATHOLOGIST SMEAR REVIEW

## 2018-10-16 LAB — GLUCOSE, CAPILLARY
Glucose-Capillary: 92 mg/dL (ref 70–99)
Glucose-Capillary: 210 mg/dL — ABNORMAL HIGH (ref 70–99)

## 2018-10-16 MED ORDER — ASPIRIN EC 81 MG PO TBEC: 81.0000 mg | DELAYED_RELEASE_TABLET | Freq: Every day | ORAL | 0 refills | Status: AC

## 2018-10-16 MED ORDER — AMLODIPINE BESYLATE 10 MG PO TABS: 10.0000 mg | ORAL_TABLET | Freq: Every evening | ORAL | Status: DC

## 2018-10-16 MED ORDER — SODIUM CHLORIDE 0.9 % IV BOLUS
1000.0000 mL | Freq: Once | INTRAVENOUS | Status: AC
  Administered 2018-10-16: 1000 mL via INTRAVENOUS

## 2018-10-16 NOTE — Progress Notes (Signed)
SLP Cancellation Note  Patient Details Name: Tammy [name] MRN: [id number] DOB: [birth year]   Cancelled treatment:         Reason Eval/Treat Not Completed: SLP screened, no needs identified, will sign off (chart reviewed; met w/ patient in room). The patient reports swallowing/speech/communication/cognition are at baseline.    Leroy Sea, [place], Susie 10/16/2018, 8:47 AM

## 2018-10-16 NOTE — Progress Notes (Signed)
*  PRELIMINARY RESULTS* Echocardiogram 2D Echocardiogram has been performed.  Tammy [name] 10/16/2018, 9:36 AM

## 2018-10-16 NOTE — Progress Notes (Signed)
*  PRELIMINARY RESULTS* Echocardiogram 2D Echocardiogram has been performed.  Sherrie Sport 10/16/2018, 9:39 AM

## 2018-10-16 NOTE — Progress Notes (Signed)
*  PRELIMINARY RESULTS* Echocardiogram 2D Echocardiogram has been performed.  Tammy [name] 10/16/2018, 9:37 AM

## 2018-10-16 NOTE — Progress Notes (Signed)
Pt is being discharge home. Discharge papers given and explained to pt. Pt verbalized understanding.  Meds and f/u appointments reviewed.  Rx to be picked up from pharmacy.  Pt made aware.

## 2018-10-16 NOTE — Progress Notes (Signed)
*  PRELIMINARY RESULTS* Echocardiogram 2D Echocardiogram has been performed.  Sherrie Sport 10/16/2018, 9:38 AM

## 2018-10-16 NOTE — Progress Notes (Signed)
Advanced care plan.  Purpose of the Encounter: CODE STATUS  Parties in Attendance: Patient  Patient's Decision Capacity: Good  Subjective/Patient's story: Presented to the emergency room for difficulty in vision Was referred from ophthalmology clinic   Objective/Medical story Patient needs work-up for stroke She needs CT head, MRI brain MRA brain and CTA head and neck   Goals of care determination:  Advance care directives and goals of care discussed Patient wants everything done which includes cpr, intubation and ventilator if need arises   CODE STATUS: Full code   Time spent discussing advanced care planning: 16 minutes

## 2018-10-16 NOTE — Care Management CC44 (Signed)
Condition Code 44 Documentation Completed  Patient Details  Name: Tammy [name] MRN: [id number] Date of Birth: [birth year]   Condition Code 44 given:  Yes Patient signature on Condition Code 44 notice:  Yes Documentation of 2 MD's agreement:  Yes Code 44 added to claim:  Yes    Elza Rafter, RN 10/16/2018, 12:43 PM

## 2018-10-16 NOTE — Consult Note (Addendum)
Referring Physician: Harrie Foreman, MD    Chief Complaint: Right eye vision loss   HPI: Tammy [name] is an 72 y.o. female past medical history of diabetes mellitus, chronic kidney disease, hyperlipidemia, hypertension, iron deficiency anemia, presenting to the ED on  10/15/2018 with sudden right eye vision loss. Patient states that she developed sudden onset of right eye vision loss at around 4PM. Patient describes episode as sudden painless loss of vision in the right eye without associated vertigo/dizziness. No symptoms of eye redness or pain and tearing associated with visual loss (intermittent angle closure glaucoma). Patient states nothing seem to precipitate episode such as postural changes or exercise, loss of vision when eyes are moved into certain positions of gaze (gaze-evoked amaurosis) or loss of vision after exercise or a hot shower (Uhthoff's symptom) to suggest demyelinating disease of the optic nerve. Denies associated altered sensorium, speech abnormality, cranial nerve deficit, seizures, focal motor or sensory deficits, diplopia, nausea or vomiting, headache,  ipsilateral or contralateral paralysis/weakness, numbness or tingling, involuntary movements, tremor. She denies history of head injury or trauma, recent infection or starting a new medication.  Patient saw his ophthalmology Dr. Wallace Going who sent her to the ED for further evaluation of a possible retinal artery occlusion. On arrival to the ED, code stroke was initiated and patient evaluated by a tele-neurologist. Initial NIH stroke scale 1. CT head was negative with no acute intracranial abnormality noted.  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  CTA head and neck did not show large vessel occlusion, hemodynamically significant stenosis, dissection or aneurysm.  Follow-up MRI of the brain also negative with no acute intracranial abnormality noted.  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  Past Medical History:  Diagnosis Date  . Allergic rhinitis   . Chronic kidney disease   . Chronic lymphocytic leukemia ([place])   . DA (degenerative arthritis)   . Diabetes mellitus   . Headache   . Heart murmur   . History of kidney stones   . Hyperlipidemia   . Hypertension   . Hypertriglyceridemia   . Iron deficiency anemia   . Other elevated white blood cell count   . Unspecified deficiency anemia     Past Surgical History:  Procedure Laterality Date  . ABDOMINAL HYSTERECTOMY     50 years  . COLONOSCOPY  2008   most recent  . [place]   lask  . JOINT REPLACEMENT    . JOINT REPLACEMENT    . KNEE ARTHROTOMY Left 05/15/2018   Procedure: KNEE ARTHROTOMY,LYSIS OF ADHESIONS, POLY EXCHANGE;  Surgeon: Dereck Leep, MD;  Location: [facility] ORS;  Service: Orthopedics;  Laterality: Left;  . OOPHORECTOMY    . REFRACTIVE SURGERY Bilateral 1996  . REPLACEMENT TOTAL KNEE Right 2015  . REPLACEMENT TOTAL KNEE Left 2007    Family History  Problem Relation Age of Onset  . Cancer Father        gastric cancer  . Cancer Sister        gastric cancer  . Diabetes Mother   . Hypertension Mother   . Diabetes Brother   . Diabetes Brother   . Diabetes Sister   . Breast cancer Neg Hx    Social History:  reports that she has never smoked. She has never used smokeless tobacco. She reports that she does not  drink alcohol or use drugs.  Allergies:  Allergies  Allergen Reactions  . Latex Rash  . Sulfa Antibiotics Rash    Medications:  I have reviewed the patient's current medications. Prior to Admission:  No medications prior to admission.   Scheduled: . amLODipine  10 mg Oral QPM  . cholecalciferol  5,000 Units Oral Daily  . ferrous sulfate  325 mg Oral Q breakfast  . heparin  5,000 Units Subcutaneous Q8H  . insulin aspart  0-5 Units  Subcutaneous QHS  . insulin aspart  0-9 Units Subcutaneous TID WC  . multivitamin with minerals  1 tablet Oral Daily  . simvastatin  10 mg Oral QHS    ROS: History obtained from the patient   General ROS: negative for - chills, fatigue, fever, night sweats, weight gain or weight loss Psychological ROS: negative for - behavioral disorder, hallucinations, memory difficulties, mood swings or suicidal ideation Ophthalmic ROS: negative for - blurry vision, double vision, eye pain or loss of vision ENT ROS: negative for - epistaxis, nasal discharge, oral lesions, sore throat, tinnitus or vertigo Allergy and Immunology ROS: negative for - hives or itchy/watery eyes Hematological and Lymphatic ROS: negative for - bleeding problems, bruising or swollen lymph nodes Endocrine ROS: negative for - galactorrhea, hair pattern changes, polydipsia/polyuria or temperature intolerance Respiratory ROS: negative for - cough, hemoptysis, shortness of breath or wheezing Cardiovascular ROS: negative for - chest pain, dyspnea on exertion, edema or irregular heartbeat Gastrointestinal ROS: negative for - abdominal pain, diarrhea, hematemesis, nausea/vomiting or stool incontinence Genito-Urinary ROS: negative for - dysuria, hematuria, incontinence or urinary frequency/urgency Musculoskeletal ROS: negative for - joint swelling or muscular weakness Neurological ROS: as noted in HPI Dermatological ROS: negative for rash and skin lesion changes  Physical Examination: Blood pressure (!) 147/58, pulse 75, temperature 98 F (36.7 C), temperature source Oral, resp. rate 18, height _0  (1.727 m), weight 76.4 kg, SpO2 98 %.   HEENT-  Normocephalic, no lesions, without obvious abnormality.  Normal external eye and conjunctiva.  Normal TM's bilaterally.  Normal auditory canals and external ears. Normal external nose, mucus membranes and septum.  Normal pharynx. Cardiovascular- S1, S2 normal, pulses palpable throughout    Lungs- chest clear, no wheezing, rales, normal symmetric air entry Abdomen- soft, non-tender; bowel sounds normal; no masses,  no organomegaly Extremities- no edema Lymph-no adenopathy palpable Musculoskeletal-no joint tenderness, deformity or swelling Skin-warm and dry, no hyperpigmentation, vitiligo, or suspicious lesions  Neurological Exam   Mental Status: Alert, oriented, thought content appropriate.  Speech fluent without evidence of aphasia.  Able to follow 3 step commands without difficulty. Attention span and concentration seemed appropriate  Cranial Nerves: II: Discs blurred on the right; Visual fields grossly normal, pupils equal, round, reactive to light and accommodation III,IV, VI: ptosis not present, extra-ocular motions intact bilaterally V,VII: smile symmetric, facial light touch sensation intact VIII: hearing normal bilaterally IX,X: gag reflex present XI: bilateral shoulder shrug XII: midline tongue extension Motor: Right :  Upper extremity   5/5 Without pronator drift      Left: Upper extremity   5/5 without pronator drift Right:   Lower extremity   5/5                                          Left: Lower extremity   5/5 Tone and bulk:normal tone throughout; no atrophy noted Sensory: Pinprick and  light touch intact bilaterally Deep Tendon Reflexes: 2+ and symmetric throughout Plantars: Right: mute                              Left: mute Cerebellar: Finger-to-nose testing intact bilaterally. Heel to shin testing normal bilaterally Gait: not tested due to safety concerns  Data Reviewed  Laboratory Studies:  Basic Metabolic Panel: Recent Labs  Lab 10/15/18 1742  NA 139  K 4.2  CL 102  CO2 26  GLUCOSE 217*  BUN 33*  CREATININE 1.54*  CALCIUM 9.2    Liver Function Tests: Recent Labs  Lab 10/15/18 1742  AST 22  ALT 18  ALKPHOS 68  BILITOT 0.6  PROT 7.2  ALBUMIN 4.3   No results for input(s): LIPASE, AMYLASE in the last 168 hours. No results  for input(s): AMMONIA in the last 168 hours.  CBC: Recent Labs  Lab 10/15/18 1742  WBC 40.9*  NEUTROABS 5.6  HGB 11.3*  HCT 36.2  MCV 97.1  PLT 189    Cardiac Enzymes: Recent Labs  Lab 10/15/18 1742  TROPONINI <0.03    BNP: Invalid input(s): POCBNP  CBG: Recent Labs  Lab 10/15/18 1741 10/15/18 2125 10/16/18 0758 10/16/18 1158  GLUCAP 187* 276* 92 210*    Microbiology: Results for orders placed or performed during the hospital encounter of 05/15/18  Aerobic/Anaerobic Culture (surgical/deep wound)     Status: None   Collection Time: 05/15/18  5:42 PM  Result Value Ref Range Status   Specimen Description SYNOVIAL TISSUE LEFT KNEE  Final   Special Requests SAMPLE A  Final   Gram Stain   Final    NO ORGANISMS SEEN RARE WBC SEEN Performed at [facility], [address], [place], [place] [address]    Culture   Final    No growth aerobically or anaerobically. Performed at [facility], [place] [address], [place], [place] [address]    Report Status 05/21/2018 FINAL  Final    Coagulation Studies: Recent Labs    10/15/18 1742  LABPROT 13.6  INR 1.05    Urinalysis: No results for input(s): COLORURINE, LABSPEC, PHURINE, GLUCOSEU, HGBUR, BILIRUBINUR, KETONESUR, PROTEINUR, UROBILINOGEN, NITRITE, LEUKOCYTESUR in the last 168 hours.  Invalid input(s): APPERANCEUR  Lipid Panel:    Component Value Date/Time   CHOL 134 10/15/2018 2107   TRIG 67 10/15/2018 2107   HDL 54 10/15/2018 2107   CHOLHDL 2.5 10/15/2018 2107   VLDL 13 10/15/2018 2107   LDLCALC 67 10/15/2018 2107    HgbA1C:  Lab Results  Component Value Date   HGBA1C 8.5 (H) 10/15/2018    Urine Drug Screen:  No results found for: LABOPIA, COCAINSCRNUR, LABBENZ, AMPHETMU, THCU, LABBARB  Alcohol Level: No results for input(s): ETH in the last 168 hours.  Other results: EKG: there are no previous tracings available for comparison.  Imaging: Ct Angio Head W Or Wo Contrast  Result  Date: 10/15/2018 CLINICAL DATA:  72 y/o F; sudden onset of right-sided vision loss. Central retinal artery occlusion. EXAM: CT ANGIOGRAPHY HEAD AND NECK TECHNIQUE: Multidetector CT imaging of the head and neck was performed using the standard protocol during bolus administration of intravenous contrast. Multiplanar CT image reconstructions and MIPs were obtained to evaluate the vascular anatomy. Carotid stenosis measurements (when applicable) are obtained utilizing NASCET criteria, using the distal internal carotid diameter as the denominator. CONTRAST:  4m OMNIPAQUE IOHEXOL 350 MG/ML SOLN COMPARISON:  10/15/2018 CT head.  FINDINGS: CTA NECK FINDINGS Aortic arch: Bovine variant branching. Imaged portion shows no evidence of aneurysm or dissection. No significant stenosis of the major arch vessel origins. Mild calcific atherosclerosis of the aorta. Right carotid system: No evidence of dissection, stenosis (50% or greater) or occlusion. Left carotid system: Calcified plaque of the left carotid bifurcation with mild to moderate 50% proximal ICA stenosis. No dissection or aneurysm. No dissection or aneurysm. Vertebral arteries: Left dominant. No evidence of dissection, stenosis (50% or greater) or occlusion. Skeleton: C5-6 moderate loss of intervertebral disc space height and endplate marginal osteophytes. Other neck: 16 mm nodule within the left lobe of the thyroid gland. Upper chest: Negative. Review of the MIP images confirms the above findings CTA HEAD FINDINGS Anterior circulation: No significant stenosis, proximal occlusion, aneurysm, or vascular malformation. Calcific atherosclerosis of carotid siphons without significant stenosis. Posterior circulation: No significant stenosis, proximal occlusion, aneurysm, or vascular malformation. Venous sinuses: As permitted by contrast timing, patent. Anatomic variants: Complete circle-of-Willis. Azygos left A2. Delayed phase: No abnormal intracranial enhancement. Review  of the MIP images confirms the above findings IMPRESSION: CTA neck: 1. Left proximal ICA mild-to-moderate 50% stenosis with calcified plaque. 2. Patent carotid and vertebral arteries. No dissection, aneurysm, or additional segment of hemodynamically significant stenosis utilizing NASCET criteria. 3. 16 mm nodule within the left lobe of thyroid gland. Further evaluation with thyroid ultrasound is recommended on a nonemergent basis. CTA head: 1. Patent anterior and posterior intracranial circulation. No large vessel occlusion, aneurysm, or significant stenosis. 2. Mild non stenotic calcific atherosclerosis of the carotid siphons. Electronically Signed   By: Kristine Garbe M.D.   On: 10/15/2018 19:14   Ct Angio Neck W And/or Wo Contrast  Result Date: 10/15/2018 CLINICAL DATA:  72 y/o F; sudden onset of right-sided vision loss. Central retinal artery occlusion. EXAM: CT ANGIOGRAPHY HEAD AND NECK TECHNIQUE: Multidetector CT imaging of the head and neck was performed using the standard protocol during bolus administration of intravenous contrast. Multiplanar CT image reconstructions and MIPs were obtained to evaluate the vascular anatomy. Carotid stenosis measurements (when applicable) are obtained utilizing NASCET criteria, using the distal internal carotid diameter as the denominator. CONTRAST:  52m OMNIPAQUE IOHEXOL 350 MG/ML SOLN COMPARISON:  10/15/2018 CT head. FINDINGS: CTA NECK FINDINGS Aortic arch: Bovine variant branching. Imaged portion shows no evidence of aneurysm or dissection. No significant stenosis of the major arch vessel origins. Mild calcific atherosclerosis of the aorta. Right carotid system: No evidence of dissection, stenosis (50% or greater) or occlusion. Left carotid system: Calcified plaque of the left carotid bifurcation with mild to moderate 50% proximal ICA stenosis. No dissection or aneurysm. No dissection or aneurysm. Vertebral arteries: Left dominant. No evidence of  dissection, stenosis (50% or greater) or occlusion. Skeleton: C5-6 moderate loss of intervertebral disc space height and endplate marginal osteophytes. Other neck: 16 mm nodule within the left lobe of the thyroid gland. Upper chest: Negative. Review of the MIP images confirms the above findings CTA HEAD FINDINGS Anterior circulation: No significant stenosis, proximal occlusion, aneurysm, or vascular malformation. Calcific atherosclerosis of carotid siphons without significant stenosis. Posterior circulation: No significant stenosis, proximal occlusion, aneurysm, or vascular malformation. Venous sinuses: As permitted by contrast timing, patent. Anatomic variants: Complete circle-of-Willis. Azygos left A2. Delayed phase: No abnormal intracranial enhancement. Review of the MIP images confirms the above findings IMPRESSION: CTA neck: 1. Left proximal ICA mild-to-moderate 50% stenosis with calcified plaque. 2. Patent carotid and vertebral arteries. No dissection, aneurysm, or additional segment of hemodynamically significant  stenosis utilizing NASCET criteria. 3. 16 mm nodule within the left lobe of thyroid gland. Further evaluation with thyroid ultrasound is recommended on a nonemergent basis. CTA head: 1. Patent anterior and posterior intracranial circulation. No large vessel occlusion, aneurysm, or significant stenosis. 2. Mild non stenotic calcific atherosclerosis of the carotid siphons. Electronically Signed   By: Kristine Garbe M.D.   On: 10/15/2018 19:14   Mr Brain Wo Contrast  Result Date: 10/16/2018 CLINICAL DATA:  Focal neurological deficits EXAM: MRI HEAD WITHOUT CONTRAST MRA HEAD WITHOUT CONTRAST TECHNIQUE: Multiplanar, multiecho pulse sequences of the brain and surrounding structures were obtained without intravenous contrast. Angiographic images of the head were obtained using MRA technique without contrast. COMPARISON:  CTA head neck 10/15/2018 FINDINGS: MRI HEAD FINDINGS BRAIN: There is no  acute infarct, acute hemorrhage or mass effect. The midline structures are normal. There are no old infarcts. Multifocal white matter hyperintensity, most commonly due to chronic ischemic microangiopathy. The CSF spaces are normal for age, with no hydrocephalus. Susceptibility-sensitive sequences show no chronic microhemorrhage or superficial siderosis. SKULL AND UPPER CERVICAL SPINE: The visualized skull base, calvarium, upper cervical spine and extracranial soft tissues are normal. SINUSES/ORBITS: No fluid levels or advanced mucosal thickening. No mastoid or middle ear effusion. The orbits are normal. MRA HEAD FINDINGS Intracranial internal carotid arteries: Normal. Anterior cerebral arteries: Variant azygous configuration. Middle cerebral arteries: Normal. Posterior communicating arteries: Present only on the right. Posterior cerebral arteries: Normal. Basilar artery: Normal. Vertebral arteries: Left dominant. Normal. Superior cerebellar arteries: Normal. Inferior cerebellar arteries: Normal. IMPRESSION: 1. 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 2. Normal intracranial MRA. Electronically Signed   By: Ulyses Jarred M.D.   On: 10/16/2018 03:21   US Carotid Bilateral (at [facility] And [facility] Only)  Result Date: 10/16/2018 CLINICAL DATA:  72 year old female with a history of stroke. EXAM: BILATERAL CAROTID DUPLEX ULTRASOUND TECHNIQUE: Pearline Cables scale imaging, color Doppler and duplex ultrasound were performed of bilateral carotid and vertebral arteries in the neck. COMPARISON:  None. FINDINGS: Criteria: Quantification of carotid stenosis is based on velocity parameters that correlate the residual internal carotid diameter with NASCET-based stenosis levels, using the diameter of the distal internal carotid lumen as the denominator for stenosis measurement. The following velocity measurements were obtained: RIGHT ICA:  Systolic 299 cm/sec, Diastolic 23 cm/sec CCA:  242 cm/sec SYSTOLIC ICA/CCA RATIO:  0.9  ECA:  110 cm/sec LEFT ICA:  Systolic 94 cm/sec, Diastolic 21 cm/sec CCA:  683 cm/sec SYSTOLIC ICA/CCA RATIO:  0.9 ECA:  105 cm/sec Right Brachial SBP: Not acquired Left Brachial SBP: Not acquired RIGHT CAROTID ARTERY: No significant calcifications of the right common carotid artery. Intermediate waveform maintained. Heterogeneous and partially calcified plaque at the right carotid bifurcation. No significant lumen shadowing. Low resistance waveform of the right ICA. No significant tortuosity. RIGHT VERTEBRAL ARTERY: Antegrade flow with low resistance waveform. LEFT CAROTID ARTERY: No significant calcifications of the left common carotid artery. Intermediate waveform maintained. Heterogeneous and partially calcified plaque at the left carotid bifurcation with mild lumen shadowing. Low resistance waveform of the left ICA. No significant tortuosity. LEFT VERTEBRAL ARTERY:  Antegrade flow with low resistance waveform. IMPRESSION: Color duplex indicates minimal heterogeneous and calcified plaque, with no hemodynamically significant stenosis by duplex criteria in the extracranial cerebrovascular circulation. Signed, Dulcy Fanny. Dellia Nims, RPVI [facility] Electronically Signed   By: Corrie Mckusick D.O.   On: 10/16/2018 10:28   Mr Jodene Nam Head/brain MH Cm  Result Date: 10/16/2018 CLINICAL  DATA:  Focal neurological deficits EXAM: MRI HEAD WITHOUT CONTRAST MRA HEAD WITHOUT CONTRAST TECHNIQUE: Multiplanar, multiecho pulse sequences of the brain and surrounding structures were obtained without intravenous contrast. Angiographic images of the head were obtained using MRA technique without contrast. COMPARISON:  CTA head neck 10/15/2018 FINDINGS: MRI HEAD FINDINGS BRAIN: There is no acute infarct, acute hemorrhage or mass effect. The midline structures are normal. There are no old infarcts. Multifocal white matter hyperintensity, most commonly due to chronic ischemic  microangiopathy. The CSF spaces are normal for age, with no hydrocephalus. Susceptibility-sensitive sequences show no chronic microhemorrhage or superficial siderosis. SKULL AND UPPER CERVICAL SPINE: The visualized skull base, calvarium, upper cervical spine and extracranial soft tissues are normal. SINUSES/ORBITS: No fluid levels or advanced mucosal thickening. No mastoid or middle ear effusion. The orbits are normal. MRA HEAD FINDINGS Intracranial internal carotid arteries: Normal. Anterior cerebral arteries: Variant azygous configuration. Middle cerebral arteries: Normal. Posterior communicating arteries: Present only on the right. Posterior cerebral arteries: Normal. Basilar artery: Normal. Vertebral arteries: Left dominant. Normal. Superior cerebellar arteries: Normal. Inferior cerebellar arteries: Normal. IMPRESSION: 1. 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 2. Normal intracranial MRA. Electronically Signed   By: Ulyses Jarred M.D.   On: 10/16/2018 03:21   Ct Head Code Stroke Wo Contrast  Result Date: 10/15/2018 CLINICAL DATA:  Code stroke. Sudden onset of right-sided vision loss. Diagnosed with central retinal artery occlusion at ophthalmology office and sent to ED for further workup. EXAM: CT HEAD WITHOUT CONTRAST TECHNIQUE: Contiguous axial images were obtained from the base of the skull through the vertex without intravenous contrast. COMPARISON:  CT head without contrast 10/07/2017 FINDINGS: Brain: Mild atrophy and white matter changes are stable, within normal limits for age. No acute cortical infarct, hemorrhage, or mass lesion is present. Basal ganglia are intact. Insular ribbon is normal. The brainstem and cerebellum are normal. Vascular: Minimal cavernous carotid artery calcifications are present without significant change. There is no asymmetric hyperdensity. Skull: Calvarium is intact. No focal lytic or blastic lesions are present. Sinuses/Orbits: The paranasal sinuses  and mastoid air cells are clear. Globes and orbits are unremarkable. ASPECTS [facility] Stroke Program Early CT Score) - Ganglionic level infarction (caudate, lentiform nuclei, internal capsule, insula, M1-M3 cortex): 7/7 - Supraganglionic infarction (M4-M6 cortex): 3/3 Total score (0-10 with 10 being normal): 10/10 IMPRESSION: 1. Normal CT of the head for age. 2. Atherosclerosis 3. ASPECTS is 10/10 These results were called by telephone at the time of interpretation on 10/15/2018 at 5:43 pm to Dr. Nance Pear , who verbally acknowledged these results. Electronically Signed   By: San Morelle M.D.   On: 10/15/2018 17:45   Assessment: 72 y.o. female with past medical history of diabetes mellitus, chronic kidney disease, hyperlipidemia, hypertension, iron deficiency anemia, presenting to the ED on  10/15/2018 with sudden painless loss of vision in the right eye without associated vertigo/dizziness. Initial concerns for CRAO. Symptoms now improved. CT head was negative with no acute intracranial abnormality noted.  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  CTA head and neck reviewed and show no large vessel occlusion, hemodynamically significant stenosis, dissection or aneurysm.  Follow-up MRI of the brain also negative with no acute intracranial abnormality noted.  Hemoglobin A1c 8.5 increased from 5 months ago (7.9), LDL 67 CRP <0.8 mg/dL, ESR 28.  Patient states she was taking aspirin 81 mg prior to this event.   Stroke Risk Factors - diabetes mellitus,  family history, hyperlipidemia and hypertension  Plan: 1. Continue medical management with Aspirin 81 mg/day with intensive management of vascular risk factor to keep systolic BP (SBP) <233 mm Hg (130 mm Hg if diabetic 2. Statin with goal low density lipoprotein (LDL) <70 mg/dl 3.  Recommend follow-up with outpatient ophthalmology  This patient was staffed with Dr. Irish Elders,  Alease Frame who personally evaluated patient, reviewed documentation and agreed with assessment and plan of care as above.  Rufina Falco, DNP, FNP-BC Board certified Nurse Practitioner Neurology Department   10/16/2018, 1:11 PM

## 2018-10-16 NOTE — Care Management Obs Status (Signed)
[facility] NOTIFICATION   Patient Details  Name: ROXANN VIERRA MRN: [id number] Date of Birth: June 24, 1946   Medicare Observation Status Notification Given:  Yes    Elza Rafter, RN 10/16/2018, 12:43 PM

## 2018-10-16 NOTE — Care Management (Signed)
RNCM spoke with MD and he would like patient converted to Observation status.  I have sent emails to UR Team for review.

## 2018-10-16 NOTE — Discharge Summary (Signed)
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 Allergic rhinitis   . Chronic kidney disease   . Chronic lymphocytic leukemia ([place])   . DA (degenerative arthritis)   . Diabetes mellitus   . Headache   . Heart murmur   . History of kidney stones   . Hyperlipidemia   . Hypertension   . Hypertriglyceridemia   . Iron deficiency anemia   . Other elevated white blood cell count   . Unspecified deficiency anemia      ADMITTING HISTORY The patient with past medical history of CKD, diabetes and hypertension presents to the emergency department from her ophthalmologist office after developing eye pain and loss of vision in her right eye.  Her symptoms were consistent with central retinal artery occlusion and her ophthalmologist sent her directly to the emergency department where the patient underwent a CTA of the head.  She declined thrombolytic therapy.  Patient denies numbness or weakness in her extremities, difficulty swallowing or speaking, palpitations or nausea or vomiting.  As her symptoms do not completely resolve the emergency department staff called the hospitalist service for further evaluation.   HOSPITAL COURSE:  Was referred from ophthalmology clinic.  She was worked up with CT head, CTA head and neck, MRI and MRA brain.  She was also worked up with carotid ultrasound and echocardiogram.  She refused thrombolytic therapy.  Her vision in the  right eye improved.  Neurology consultation was done .  No intervention recommended.  No complains of any tingling or numbness or any slurred speech.  No difficulty swallowing food .Discussed with ophthalmology who said they will follow-up with the patient as outpatient no acute intervention recommended.  Patient will continue oral aspirin.  CONSULTS OBTAINED:  Treatment Team:  Alexis Goodell, MD  DRUG ALLERGIES:   Allergies  Allergen Reactions  . Latex Rash  . Sulfa Antibiotics Rash    DISCHARGE MEDICATIONS:   Allergies as of 10/16/2018      Reactions   Latex Rash   Sulfa Antibiotics Rash      Medication List    TAKE these medications   acetaminophen 500 MG tablet Commonly known as:  TYLENOL Take 1,000 mg by mouth every 6 (six) hours as needed for moderate pain or headache.   amLODipine 5 MG tablet Commonly known as:  NORVASC Take 5 mg by mouth every evening.   aspirin EC 81 MG tablet Take 1 tablet (81 mg total) by mouth daily.   benazepril-hydrochlorthiazide 20-12.5 MG tablet Commonly known as:  LOTENSIN HCT Take 1 tablet by mouth daily.   CEROVITE SENIOR Tabs Take 1 tablet by mouth daily.   Cholecalciferol 125 MCG (5000 UT) capsule Take 5,000 Units by mouth daily.   ferrous sulfate 325 (65 FE) MG tablet Take 325 mg by mouth daily with breakfast.   glimepiride 4 MG tablet Commonly known as:  AMARYL Take 4 mg by mouth 2 (two) times daily.   meclizine 25 MG tablet Commonly known as:  ANTIVERT Take  25 mg by mouth 3 (three) times daily as needed for dizziness.   pioglitazone 45 MG tablet Commonly known as:  ACTOS Take 45 mg by mouth daily.   simvastatin 10 MG tablet Commonly known as:  ZOCOR Take 10 mg by mouth at bedtime.   sitaGLIPtin 50 MG tablet Commonly known as:  JANUVIA Take 50 mg by mouth daily.       Today  Patient seen today Vision improved right eye No dysphagia No slurred speech  VITAL SIGNS:  Blood pressure (!) 147/58, pulse  75, temperature 98 F (36.7 C), temperature source Oral, resp. rate 18, height 5\' 8"  (1.727 m), weight 76.4 kg, SpO2 98 %.  I/O:    Intake/Output Summary (Last 24 hours) at 10/16/2018 1253 Last data filed at 10/16/2018 0942 Gross per 24 hour  Intake 1241.13 ml  Output -  Net 1241.13 ml    PHYSICAL EXAMINATION:  Physical Exam  GENERAL:  72 y.o.-year-old patient lying in the bed with no acute distress.  LUNGS: Normal breath sounds bilaterally, no wheezing, rales,rhonchi or crepitation. No use of accessory muscles of respiration.  CARDIOVASCULAR: S1, S2 normal. No murmurs, rubs, or gallops.  ABDOMEN: Soft, non-tender, non-distended. Bowel sounds present. No organomegaly or mass.  NEUROLOGIC: Moves all 4 extremities. PSYCHIATRIC: The patient is alert and oriented x 3.  SKIN: No obvious rash, lesion, or ulcer.   DATA REVIEW:   CBC Recent Labs  Lab 10/15/18 1742  WBC 40.9*  HGB 11.3*  HCT 36.2  PLT 189    Chemistries  Recent Labs  Lab 10/15/18 1742  NA 139  K 4.2  CL 102  CO2 26  GLUCOSE 217*  BUN 33*  CREATININE 1.54*  CALCIUM 9.2  AST 22  ALT 18  ALKPHOS 68  BILITOT 0.6    Cardiac Enzymes Recent Labs  Lab 10/15/18 1742  TROPONINI <0.03    Microbiology Results  Results for orders placed or performed during the hospital encounter of 05/15/18  Aerobic/Anaerobic Culture (surgical/deep wound)     Status: None   Collection Time: 05/15/18  5:42 PM  Result Value Ref Range Status   Specimen Description SYNOVIAL TISSUE LEFT KNEE  Final   Special Requests SAMPLE A  Final   Gram Stain   Final    NO ORGANISMS SEEN RARE WBC SEEN Performed at [facility], [address], [place], [place] [address]    Culture   Final    No growth aerobically or anaerobically. Performed at [facility], [place] [address], [place], [place] [address]    Report Status 05/21/2018 FINAL  Final    RADIOLOGY:  Ct Angio Head W Or Wo Contrast  Result Date:  10/15/2018 CLINICAL DATA:  72 y/o F; sudden onset of right-sided vision loss. Central retinal artery occlusion. EXAM: CT ANGIOGRAPHY HEAD AND NECK TECHNIQUE: Multidetector CT imaging of the head and neck was performed using the standard protocol during bolus administration of intravenous contrast. Multiplanar CT image reconstructions and MIPs were obtained to evaluate the vascular anatomy. Carotid stenosis measurements (when applicable) are obtained utilizing NASCET criteria, using the distal internal carotid diameter as the denominator. CONTRAST:  17mL OMNIPAQUE IOHEXOL 350 MG/ML SOLN COMPARISON:  10/15/2018 CT head. FINDINGS: CTA NECK FINDINGS Aortic arch: Bovine variant branching. Imaged portion shows no evidence of aneurysm or dissection. No significant stenosis of the major arch vessel origins. Mild calcific atherosclerosis of the aorta. Right carotid system: No evidence of dissection, stenosis (50% or greater) or occlusion. Left  carotid system: Calcified plaque of the left carotid bifurcation with mild to moderate 50% proximal ICA stenosis. No dissection or aneurysm. No dissection or aneurysm. Vertebral arteries: Left dominant. No evidence of dissection, stenosis (50% or greater) or occlusion. Skeleton: C5-6 moderate loss of intervertebral disc space height and endplate marginal osteophytes. Other neck: 16 mm nodule within the left lobe of the thyroid gland. Upper chest: Negative. Review of the MIP images confirms the above findings CTA HEAD FINDINGS Anterior circulation: No significant stenosis, proximal occlusion, aneurysm, or vascular malformation. Calcific atherosclerosis of carotid siphons without significant stenosis. Posterior circulation: No significant stenosis, proximal occlusion, aneurysm, or vascular malformation. Venous sinuses: As permitted by contrast timing, patent. Anatomic variants: Complete circle-of-Willis. Azygos left A2. Delayed phase: No abnormal intracranial enhancement. Review of the  MIP images confirms the above findings IMPRESSION: CTA neck: 1. Left proximal ICA mild-to-moderate 50% stenosis with calcified plaque. 2. Patent carotid and vertebral arteries. No dissection, aneurysm, or additional segment of hemodynamically significant stenosis utilizing NASCET criteria. 3. 16 mm nodule within the left lobe of thyroid gland. Further evaluation with thyroid ultrasound is recommended on a nonemergent basis. CTA head: 1. Patent anterior and posterior intracranial circulation. No large vessel occlusion, aneurysm, or significant stenosis. 2. Mild non stenotic calcific atherosclerosis of the carotid siphons. Electronically Signed   By: Kristine Garbe M.D.   On: 10/15/2018 19:14   Ct Angio Neck W And/or Wo Contrast  Result Date: 10/15/2018 CLINICAL DATA:  72 y/o F; sudden onset of right-sided vision loss. Central retinal artery occlusion. EXAM: CT ANGIOGRAPHY HEAD AND NECK TECHNIQUE: Multidetector CT imaging of the head and neck was performed using the standard protocol during bolus administration of intravenous contrast. Multiplanar CT image reconstructions and MIPs were obtained to evaluate the vascular anatomy. Carotid stenosis measurements (when applicable) are obtained utilizing NASCET criteria, using the distal internal carotid diameter as the denominator. CONTRAST:  72mL OMNIPAQUE IOHEXOL 350 MG/ML SOLN COMPARISON:  10/15/2018 CT head. FINDINGS: CTA NECK FINDINGS Aortic arch: Bovine variant branching. Imaged portion shows no evidence of aneurysm or dissection. No significant stenosis of the major arch vessel origins. Mild calcific atherosclerosis of the aorta. Right carotid system: No evidence of dissection, stenosis (50% or greater) or occlusion. Left carotid system: Calcified plaque of the left carotid bifurcation with mild to moderate 50% proximal ICA stenosis. No dissection or aneurysm. No dissection or aneurysm. Vertebral arteries: Left dominant. No evidence of dissection,  stenosis (50% or greater) or occlusion. Skeleton: C5-6 moderate loss of intervertebral disc space height and endplate marginal osteophytes. Other neck: 16 mm nodule within the left lobe of the thyroid gland. Upper chest: Negative. Review of the MIP images confirms the above findings CTA HEAD FINDINGS Anterior circulation: No significant stenosis, proximal occlusion, aneurysm, or vascular malformation. Calcific atherosclerosis of carotid siphons without significant stenosis. Posterior circulation: No significant stenosis, proximal occlusion, aneurysm, or vascular malformation. Venous sinuses: As permitted by contrast timing, patent. Anatomic variants: Complete circle-of-Willis. Azygos left A2. Delayed phase: No abnormal intracranial enhancement. Review of the MIP images confirms the above findings IMPRESSION: CTA neck: 1. Left proximal ICA mild-to-moderate 50% stenosis with calcified plaque. 2. Patent carotid and vertebral arteries. No dissection, aneurysm, or additional segment of hemodynamically significant stenosis utilizing NASCET criteria. 3. 16 mm nodule within the left lobe of thyroid gland. Further evaluation with thyroid ultrasound is recommended on a nonemergent basis. CTA head: 1. Patent anterior and posterior intracranial circulation. No large vessel occlusion, aneurysm, or significant stenosis. 2. Mild non stenotic  calcific atherosclerosis of the carotid siphons. Electronically Signed   By: Kristine Garbe M.D.   On: 10/15/2018 19:14   Mr Brain Wo Contrast  Result Date: 10/16/2018 CLINICAL DATA:  Focal neurological deficits EXAM: MRI HEAD WITHOUT CONTRAST MRA HEAD WITHOUT CONTRAST TECHNIQUE: Multiplanar, multiecho pulse sequences of the brain and surrounding structures were obtained without intravenous contrast. Angiographic images of the head were obtained using MRA technique without contrast. COMPARISON:  CTA head neck 10/15/2018 FINDINGS: MRI HEAD FINDINGS BRAIN: There is no acute  infarct, acute hemorrhage or mass effect. The midline structures are normal. There are no old infarcts. Multifocal white matter hyperintensity, most commonly due to chronic ischemic microangiopathy. The CSF spaces are normal for age, with no hydrocephalus. Susceptibility-sensitive sequences show no chronic microhemorrhage or superficial siderosis. SKULL AND UPPER CERVICAL SPINE: The visualized skull base, calvarium, upper cervical spine and extracranial soft tissues are normal. SINUSES/ORBITS: No fluid levels or advanced mucosal thickening. No mastoid or middle ear effusion. The orbits are normal. MRA HEAD FINDINGS Intracranial internal carotid arteries: Normal. Anterior cerebral arteries: Variant azygous configuration. Middle cerebral arteries: Normal. Posterior communicating arteries: Present only on the right. Posterior cerebral arteries: Normal. Basilar artery: Normal. Vertebral arteries: Left dominant. Normal. Superior cerebellar arteries: Normal. Inferior cerebellar arteries: Normal. IMPRESSION: 1. 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 2. Normal intracranial MRA. Electronically Signed   By: Ulyses Jarred M.D.   On: 10/16/2018 03:21   US Carotid Bilateral (at [facility] And [facility] Only)  Result Date: 10/16/2018 CLINICAL DATA:  72 year old female with a history of stroke. EXAM: BILATERAL CAROTID DUPLEX ULTRASOUND TECHNIQUE: Pearline Cables scale imaging, color Doppler and duplex ultrasound were performed of bilateral carotid and vertebral arteries in the neck. COMPARISON:  None. FINDINGS: Criteria: Quantification of carotid stenosis is based on velocity parameters that correlate the residual internal carotid diameter with NASCET-based stenosis levels, using the diameter of the distal internal carotid lumen as the denominator for stenosis measurement. The following velocity measurements were obtained: RIGHT ICA:  Systolic 841 cm/sec, Diastolic 23 cm/sec CCA:  324 cm/sec SYSTOLIC ICA/CCA RATIO:  0.9 ECA:   110 cm/sec LEFT ICA:  Systolic 94 cm/sec, Diastolic 21 cm/sec CCA:  401 cm/sec SYSTOLIC ICA/CCA RATIO:  0.9 ECA:  105 cm/sec Right Brachial SBP: Not acquired Left Brachial SBP: Not acquired RIGHT CAROTID ARTERY: No significant calcifications of the right common carotid artery. Intermediate waveform maintained. Heterogeneous and partially calcified plaque at the right carotid bifurcation. No significant lumen shadowing. Low resistance waveform of the right ICA. No significant tortuosity. RIGHT VERTEBRAL ARTERY: Antegrade flow with low resistance waveform. LEFT CAROTID ARTERY: No significant calcifications of the left common carotid artery. Intermediate waveform maintained. Heterogeneous and partially calcified plaque at the left carotid bifurcation with mild lumen shadowing. Low resistance waveform of the left ICA. No significant tortuosity. LEFT VERTEBRAL ARTERY:  Antegrade flow with low resistance waveform. IMPRESSION: Color duplex indicates minimal heterogeneous and calcified plaque, with no hemodynamically significant stenosis by duplex criteria in the extracranial cerebrovascular circulation. Signed, Dulcy Fanny. Dellia Nims, RPVI [facility] Electronically Signed   By: Corrie Mckusick D.O.   On: 10/16/2018 10:28   Mr Jodene Nam Head/brain UU Cm  Result Date: 10/16/2018 CLINICAL DATA:  Focal neurological deficits EXAM: MRI HEAD WITHOUT CONTRAST MRA HEAD WITHOUT CONTRAST TECHNIQUE: Multiplanar, multiecho pulse sequences of the brain and surrounding structures were obtained without intravenous contrast. Angiographic images of the head were obtained using MRA technique without contrast. COMPARISON:  CTA head neck  10/15/2018 FINDINGS: MRI HEAD FINDINGS BRAIN: There is no acute infarct, acute hemorrhage or mass effect. The midline structures are normal. There are no old infarcts. Multifocal white matter hyperintensity, most commonly due to chronic ischemic  microangiopathy. The CSF spaces are normal for age, with no hydrocephalus. Susceptibility-sensitive sequences show no chronic microhemorrhage or superficial siderosis. SKULL AND UPPER CERVICAL SPINE: The visualized skull base, calvarium, upper cervical spine and extracranial soft tissues are normal. SINUSES/ORBITS: No fluid levels or advanced mucosal thickening. No mastoid or middle ear effusion. The orbits are normal. MRA HEAD FINDINGS Intracranial internal carotid arteries: Normal. Anterior cerebral arteries: Variant azygous configuration. Middle cerebral arteries: Normal. Posterior communicating arteries: Present only on the right. Posterior cerebral arteries: Normal. Basilar artery: Normal. Vertebral arteries: Left dominant. Normal. Superior cerebellar arteries: Normal. Inferior cerebellar arteries: Normal. IMPRESSION: 1. 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 2. Normal intracranial MRA. Electronically Signed   By: Ulyses Jarred M.D.   On: 10/16/2018 03:21   Ct Head Code Stroke Wo Contrast  Result Date: 10/15/2018 CLINICAL DATA:  Code stroke. Sudden onset of right-sided vision loss. Diagnosed with central retinal artery occlusion at ophthalmology office and sent to ED for further workup. EXAM: CT HEAD WITHOUT CONTRAST TECHNIQUE: Contiguous axial images were obtained from the base of the skull through the vertex without intravenous contrast. COMPARISON:  CT head without contrast 10/07/2017 FINDINGS: Brain: Mild atrophy and white matter changes are stable, within normal limits for age. No acute cortical infarct, hemorrhage, or mass lesion is present. Basal ganglia are intact. Insular ribbon is normal. The brainstem and cerebellum are normal. Vascular: Minimal cavernous carotid artery calcifications are present without significant change. There is no asymmetric hyperdensity. Skull: Calvarium is intact. No focal lytic or blastic lesions are present. Sinuses/Orbits: The paranasal sinuses  and mastoid air cells are clear. Globes and orbits are unremarkable. ASPECTS [facility] Stroke Program Early CT Score) - Ganglionic level infarction (caudate, lentiform nuclei, internal capsule, insula, M1-M3 cortex): 7/7 - Supraganglionic infarction (M4-M6 cortex): 3/3 Total score (0-10 with 10 being normal): 10/10 IMPRESSION: 1. Normal CT of the head for age. 2. Atherosclerosis 3. ASPECTS is 10/10 These results were called by telephone at the time of interpretation on 10/15/2018 at 5:43 pm to Dr. Nance Pear , who verbally acknowledged these results. Electronically Signed   By: San Morelle M.D.   On: 10/15/2018 17:45    Follow up with PCP in 1 week.  Management plans discussed with the patient, family and they are in agreement.  CODE STATUS: Full code    Code Status Orders  (From admission, onward)         Start     Ordered   10/15/18 2051  Full code  Continuous     10/15/18 2050        Code Status History    Date Active Date Inactive Code Status Order ID Comments User Context   05/15/2018 2055 05/17/2018 1921 Full Code [id number]  Dereck Leep, MD Inpatient      TOTAL TIME TAKING CARE OF THIS PATIENT ON DAY OF DISCHARGE: more than 34 minutes.   Saundra Shelling M.D on 10/16/2018 at 12:53 PM  Between 7am to 6pm - Pager - [phone]  After 6pm go to [email or web address] - password [email or web address] [facility]  Office  [phone]  CC: Primary care physician; Wenda Low, MD  Note: This dictation was prepared with Dragon dictation along with smaller phrase technology. Any transcriptional errors that result from this  process are unintentional.

## 2018-10-16 NOTE — Evaluation (Signed)
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   Clinical Impression   Pt seen for OT evaluation this date.  Pt was independent with mobility, ADL, and IADL prior to admission with 1 fall reported in past 12 months. Pt currently presents with R eye deficits (deficits more severe from midline to L upper/lower quadrants in R eye) which may impact her functional mobility, safety, and ability to perform ADL and IADL tasks at [facility]. Pt stated currently have "stationary gray blind spots and sees sunbursts which are constantly flashing"; with both eyes open no blurry vision;  impaired convergence for R eye; deficits in R eye visual tracking and peripheral vision. Pt educated in compensatory strategies including visual tracking, visual scanning exercises, safety awareness,  and navigating the environment. Pt verbalized understanding. Pt at MOD I level for all ADL tasks and mobility, 5/5 strength and ROM WNL, intact sensation and coordination.  Encouraged pt to seek OP low vision OT services upon discharge. No additional acute skilled OT needs at this time. Will sign off. Please re-consult if additional needs arise.    Follow Up Recommendations  Outpatient OT(low vision OT specialist)    Equipment Recommendations  None recommended by OT    Recommendations for Other Services       Precautions / Restrictions Precautions Precautions: Fall Restrictions Weight Bearing Restrictions: No      Mobility Bed Mobility Overal bed mobility: Independent                Transfers Overall transfer level: Independent               General transfer comment: Pt stated she ambulated to bathroom independently w/o staff in  room    Balance Overall balance assessment: Modified Independent                                         ADL either performed or assessed with clinical judgement   ADL Overall ADL's : Modified independent                                       General ADL Comments: MOD I for all ADLs given vision impairment     Vision Baseline Vision/History: Wears glasses Wears Glasses: Reading only Patient Visual Report: Peripheral vision impairment Vision Assessment?: Vision impaired- to be further tested in functional context;Yes Eye Alignment: Within Functional Limits Ocular Range of Motion: Within Functional Limits Alignment/Gaze Preference: Within Defined Limits Tracking/Visual Pursuits: Right eye does not track laterally;Right eye does not track medially;Impaired - to be further tested in functional context Convergence: Impaired - to be further tested in functional context Visual Fields: Impaired-to be further tested in functional context(L half of visual field in R eye impaired) Additional Comments: Pt stated currently have "stationary gray blind spots and sees sunbursts which are constantly flashing"; with both eyes open no blurry vision;  impaired convergence for R eye; deficits in R eye visual tracking and peripheral vision.      Perception     Praxis      Pertinent Vitals/Pain Pain  Assessment: No/denies pain     Hand Dominance     Extremity/Trunk Assessment Upper Extremity Assessment Upper Extremity Assessment: Overall WFL for tasks assessed(5/5 BUE grip, shoulders, and bi/triceps)   Lower Extremity Assessment Lower Extremity Assessment: Overall WFL for tasks assessed(5/5 BLE)       Communication Communication Communication: No difficulties   Cognition Arousal/Alertness: Awake/alert Behavior During Therapy: WFL for tasks assessed/performed Overall Cognitive Status: Within Functional Limits for tasks assessed                                      General Comments  MOD I with balance 2/2 R eye deficits     Exercises Other Exercises Other Exercises:  Pt educated in compensatory strategies including visual tracking, visual scanning exercises, safety awareness,  and navigating the environment.    Shoulder Instructions      Home Living Family/patient expects to be discharged to:: Private residence Living Arrangements: Spouse/significant other Available Help at Discharge: Family;Available 24 hours/day Type of Home: House Home Access: Stairs to enter [employer] of Steps: 3 Entrance Stairs-Rails: Right Home Layout: One level     Bathroom Shower/Tub: Occupational psychologist: Handicapped height     Home Equipment: Environmental consultant - 4 wheels;Bedside commode;Grab bars - tub/shower;Shower seat - built in          Prior Functioning/Environment Level of Independence: Independent        Comments: Pt is Charity fundraiser working part time; independent with all ADL/IADL tasks and mobility; enjoys reading; one fall in the past year        OT Problem List: Impaired vision/perception      OT Treatment/Interventions:      OT Goals(Current goals can be found in the care plan section) Acute Rehab OT Goals Patient Stated Goal: to go home OT Goal Formulation: All assessment and education complete, DC therapy Time For Goal Achievement: 10/30/18 Potential to Achieve Goals: Good  OT Frequency:     Barriers to D/C:            Co-evaluation              AM-PAC OT "6 Clicks" Daily Activity     Outcome Measure Help from another person eating meals?: None Help from another person taking care of personal grooming?: None Help from another person toileting, which includes using toliet, bedpan, or urinal?: None Help from another person bathing (including washing, rinsing, drying)?: None Help from another person to put on and taking off regular upper body clothing?: None Help from another  person to put on and taking off regular lower body clothing?: None 6 Click Score: 24   End of Session    Activity Tolerance: Patient tolerated treatment well Patient left: in bed;with call bell/phone within reach  OT Visit Diagnosis: Other symptoms and signs involving cognitive function;Other abnormalities of gait and mobility (R26.89)                Time: 8756-4332 OT Time Calculation (min): 21 min Charges:     Jadene Pierini OTS  10/16/2018, 9:49 AM

## 2018-10-16 NOTE — Progress Notes (Signed)
Inpatient Diabetes Program Recommendations  AACE/ADA: New Consensus Statement on Inpatient Glycemic Control (2019)  Target Ranges:  Prepandial:   less than 140 mg/dL      Peak postprandial:   less than 180 mg/dL (1-2 hours)      Critically ill patients:  140 - 180 mg/dL   Results for Tammy [name], Tammy [name] (MRN [id number]) as of 10/16/2018 12:00  Ref. Range 10/15/2018 17:41 10/15/2018 21:25 10/16/2018 07:58  Glucose-Capillary Latest Ref Range: 70 - 99 mg/dL 187 (H) 276 (H) 92  Results for Tammy [name], Tammy [name] (MRN [id number]) as of 10/16/2018 12:00  Ref. Range 05/03/2018 12:03 10/15/2018 21:07  Hemoglobin A1C Latest Ref Range: 4.8 - 5.6 % 7.9 (H) 8.5 (H)   Review of Glycemic Control  Diabetes history: DM2 Outpatient Diabetes medications: Actos 45 mg daily, Amaryl 4 mg BID, Januvia 50 mg daily Current orders for Inpatient glycemic control: Novolog 0-9 units TID with meals, Novolog 0-5 units QHS  Inpatient Diabetes Program Recommendations:  HgbA1C: A1C 8.5% on 10/15/18 indicating an average glucose of 197 mg/dl over the past 2-3 months.  NOTE: Noted consult for Diabetes Coordinator. Spoke with patient about diabetes and home regimen for diabetes control. Patient reports that she takes Actos 45 mg daily, Amaryl 4 mg BID, Januvia 50 mg daily as an outpatient for diabetes control. Patient reports that she is taking all DM medications consistently as prescribed. Patient states that she checks her glucose 2 times per day and she has noticed that it has been higher over the past 6-8 weeks. Patient states that she was started on a new blood pressure medication and her glucose seemed to go up after she started that medication. She followed up with her doctor and expressed her concerns about her glucose going up on the medication so her blood pressure medication was changed again. Patient states that her glucose is still in the mid 100's to 200's mg/dl most of the time. Discussed A1C results (8.5% on 10/15/18) and  explained that her current A1C indicates an average glucose of 197 mg/dl over the past 2-3 months. Discussed glucose and A1C goals. Discussed importance of checking CBGs and maintaining good CBG control to prevent long-term and short-term complications. Encouraged patient to continue checking her glucose at least 2 times per day and to keep a log book of glucose readings which she will need to take to doctor appointments. Explained how her doctor can use the log book to continue to make adjustments with DM medications if needed. Informed patient that while inpatient, insulin is ordered for glycemic control as it is not recommended to use oral DM medications while inpatient. Patient agreeable to using insulin as an inpatient.  Patient verbalized understanding of information discussed and she states that she has no further questions at this time related to diabetes.  Thanks, Barnie Alderman, RN, MSN, CDE Diabetes Coordinator Inpatient Diabetes Program [phone] (Team Pager)

## 2018-10-16 NOTE — Progress Notes (Addendum)
[facility] [place] NAME: Tammy [name]    MR#:  [id number]  DATE OF BIRTH:  [birth year]  SUBJECTIVE:  CHIEF COMPLAINT:   Chief Complaint  Patient presents with  . Code Stroke  Patient seen and evaluated today Has decreased vision in the right eye No dysphagia No slurred speech No tingling or numbness in any part of the body Patient was worked up with CT head, MRI and MRA brain, CTA head and neck  REVIEW OF SYSTEMS:    ROS  CONSTITUTIONAL: No documented fever. No fatigue, weakness. No weight gain, no weight loss.  EYES: Decreased vision right eye ENT: No tinnitus. No postnasal drip. No redness of the oropharynx.  RESPIRATORY: No cough, no wheeze, no hemoptysis. No dyspnea.  CARDIOVASCULAR: No chest pain. No orthopnea. No palpitations. No syncope.  GASTROINTESTINAL: No nausea, no vomiting or diarrhea. No abdominal pain. No melena or hematochezia.  GENITOURINARY: No dysuria or hematuria.  ENDOCRINE: No polyuria or nocturia. No heat or cold intolerance.  HEMATOLOGY: No anemia. No bruising. No bleeding.  INTEGUMENTARY: No rashes. No lesions.  MUSCULOSKELETAL: No arthritis. No swelling. No gout.  NEUROLOGIC: No numbness, tingling, or ataxia. No seizure-type activity.  PSYCHIATRIC: No anxiety. No insomnia. No ADD.   DRUG ALLERGIES:   Allergies  Allergen Reactions  . Latex Rash  . Sulfa Antibiotics Rash    VITALS:  Blood pressure (!) 147/58, pulse 75, temperature 98 F (36.7 C), temperature source Oral, resp. rate 18, height 5\' 8"  (1.727 m), weight 76.4 kg, SpO2 98 %.  PHYSICAL EXAMINATION:   Physical Exam  GENERAL:  72 y.o.-year-old patient lying in the bed with no acute distress.  EYES: Pupils equal, round, reactive to light and accommodation. No scleral icterus. Extraocular muscles intact.  HEENT: Head atraumatic, normocephalic. Oropharynx and nasopharynx clear.  NECK:  Supple, no jugular venous distention. No thyroid  enlargement, no tenderness.  LUNGS: Normal breath sounds bilaterally, no wheezing, rales, rhonchi. No use of accessory muscles of respiration.  CARDIOVASCULAR: S1, S2 normal. No murmurs, rubs, or gallops.  ABDOMEN: Soft, nontender, nondistended. Bowel sounds present. No organomegaly or mass.  EXTREMITIES: No cyanosis, clubbing or edema b/l.    NEUROLOGIC: Decreased vision right eye. No focal Motor or sensory deficits b/l.   PSYCHIATRIC: The patient is alert and oriented x 3.  SKIN: No obvious rash, lesion, or ulcer.   LABORATORY PANEL:   CBC Recent Labs  Lab 10/15/18 1742  WBC 40.9*  HGB 11.3*  HCT 36.2  PLT 189   ------------------------------------------------------------------------------------------------------------------ Chemistries  Recent Labs  Lab 10/15/18 1742  NA 139  K 4.2  CL 102  CO2 26  GLUCOSE 217*  BUN 33*  CREATININE 1.54*  CALCIUM 9.2  AST 22  ALT 18  ALKPHOS 68  BILITOT 0.6   ------------------------------------------------------------------------------------------------------------------  Cardiac Enzymes Recent Labs  Lab 10/15/18 1742  TROPONINI <0.03   ------------------------------------------------------------------------------------------------------------------  RADIOLOGY:  Ct Angio Head W Or Wo Contrast  Result Date: 10/15/2018 CLINICAL DATA:  72 y/o F; sudden onset of right-sided vision loss. Central retinal artery occlusion. EXAM: CT ANGIOGRAPHY HEAD AND NECK TECHNIQUE: Multidetector CT imaging of the head and neck was performed using the standard protocol during bolus administration of intravenous contrast. Multiplanar CT image reconstructions and MIPs were obtained to evaluate the vascular anatomy. Carotid stenosis measurements (when applicable) are obtained utilizing NASCET criteria, using the distal internal carotid diameter as the denominator. CONTRAST:  67mL OMNIPAQUE IOHEXOL 350 MG/ML SOLN COMPARISON:  10/15/2018 CT head.  FINDINGS: CTA NECK FINDINGS Aortic arch: Bovine variant branching. Imaged portion shows no evidence of aneurysm or dissection. No significant stenosis of the major arch vessel origins. Mild calcific atherosclerosis of the aorta. Right carotid system: No evidence of dissection, stenosis (50% or greater) or occlusion. Left carotid system: Calcified plaque of the left carotid bifurcation with mild to moderate 50% proximal ICA stenosis. No dissection or aneurysm. No dissection or aneurysm. Vertebral arteries: Left dominant. No evidence of dissection, stenosis (50% or greater) or occlusion. Skeleton: C5-6 moderate loss of intervertebral disc space height and endplate marginal osteophytes. Other neck: 16 mm nodule within the left lobe of the thyroid gland. Upper chest: Negative. Review of the MIP images confirms the above findings CTA HEAD FINDINGS Anterior circulation: No significant stenosis, proximal occlusion, aneurysm, or vascular malformation. Calcific atherosclerosis of carotid siphons without significant stenosis. Posterior circulation: No significant stenosis, proximal occlusion, aneurysm, or vascular malformation. Venous sinuses: As permitted by contrast timing, patent. Anatomic variants: Complete circle-of-Willis. Azygos left A2. Delayed phase: No abnormal intracranial enhancement. Review of the MIP images confirms the above findings IMPRESSION: CTA neck: 1. Left proximal ICA mild-to-moderate 50% stenosis with calcified plaque. 2. Patent carotid and vertebral arteries. No dissection, aneurysm, or additional segment of hemodynamically significant stenosis utilizing NASCET criteria. 3. 16 mm nodule within the left lobe of thyroid gland. Further evaluation with thyroid ultrasound is recommended on a nonemergent basis. CTA head: 1. Patent anterior and posterior intracranial circulation. No large vessel occlusion, aneurysm, or significant stenosis. 2. Mild non stenotic calcific atherosclerosis of the carotid  siphons. Electronically Signed   By: Kristine Garbe M.D.   On: 10/15/2018 19:14   Ct Angio Neck W And/or Wo Contrast  Result Date: 10/15/2018 CLINICAL DATA:  72 y/o F; sudden onset of right-sided vision loss. Central retinal artery occlusion. EXAM: CT ANGIOGRAPHY HEAD AND NECK TECHNIQUE: Multidetector CT imaging of the head and neck was performed using the standard protocol during bolus administration of intravenous contrast. Multiplanar CT image reconstructions and MIPs were obtained to evaluate the vascular anatomy. Carotid stenosis measurements (when applicable) are obtained utilizing NASCET criteria, using the distal internal carotid diameter as the denominator. CONTRAST:  70mL OMNIPAQUE IOHEXOL 350 MG/ML SOLN COMPARISON:  10/15/2018 CT head. FINDINGS: CTA NECK FINDINGS Aortic arch: Bovine variant branching. Imaged portion shows no evidence of aneurysm or dissection. No significant stenosis of the major arch vessel origins. Mild calcific atherosclerosis of the aorta. Right carotid system: No evidence of dissection, stenosis (50% or greater) or occlusion. Left carotid system: Calcified plaque of the left carotid bifurcation with mild to moderate 50% proximal ICA stenosis. No dissection or aneurysm. No dissection or aneurysm. Vertebral arteries: Left dominant. No evidence of dissection, stenosis (50% or greater) or occlusion. Skeleton: C5-6 moderate loss of intervertebral disc space height and endplate marginal osteophytes. Other neck: 16 mm nodule within the left lobe of the thyroid gland. Upper chest: Negative. Review of the MIP images confirms the above findings CTA HEAD FINDINGS Anterior circulation: No significant stenosis, proximal occlusion, aneurysm, or vascular malformation. Calcific atherosclerosis of carotid siphons without significant stenosis. Posterior circulation: No significant stenosis, proximal occlusion, aneurysm, or vascular malformation. Venous sinuses: As permitted by contrast  timing, patent. Anatomic variants: Complete circle-of-Willis. Azygos left A2. Delayed phase: No abnormal intracranial enhancement. Review of the MIP images confirms the above findings IMPRESSION: CTA neck: 1. Left proximal ICA mild-to-moderate 50% stenosis with calcified plaque. 2. Patent carotid and vertebral arteries. No dissection, aneurysm, or additional  segment of hemodynamically significant stenosis utilizing NASCET criteria. 3. 16 mm nodule within the left lobe of thyroid gland. Further evaluation with thyroid ultrasound is recommended on a nonemergent basis. CTA head: 1. Patent anterior and posterior intracranial circulation. No large vessel occlusion, aneurysm, or significant stenosis. 2. Mild non stenotic calcific atherosclerosis of the carotid siphons. Electronically Signed   By: Kristine Garbe M.D.   On: 10/15/2018 19:14   Mr Brain Wo Contrast  Result Date: 10/16/2018 CLINICAL DATA:  Focal neurological deficits EXAM: MRI HEAD WITHOUT CONTRAST MRA HEAD WITHOUT CONTRAST TECHNIQUE: Multiplanar, multiecho pulse sequences of the brain and surrounding structures were obtained without intravenous contrast. Angiographic images of the head were obtained using MRA technique without contrast. COMPARISON:  CTA head neck 10/15/2018 FINDINGS: MRI HEAD FINDINGS BRAIN: There is no acute infarct, acute hemorrhage or mass effect. The midline structures are normal. There are no old infarcts. Multifocal white matter hyperintensity, most commonly due to chronic ischemic microangiopathy. The CSF spaces are normal for age, with no hydrocephalus. Susceptibility-sensitive sequences show no chronic microhemorrhage or superficial siderosis. SKULL AND UPPER CERVICAL SPINE: The visualized skull base, calvarium, upper cervical spine and extracranial soft tissues are normal. SINUSES/ORBITS: No fluid levels or advanced mucosal thickening. No mastoid or middle ear effusion. The orbits are normal. MRA HEAD FINDINGS  Intracranial internal carotid arteries: Normal. Anterior cerebral arteries: Variant azygous configuration. Middle cerebral arteries: Normal. Posterior communicating arteries: Present only on the right. Posterior cerebral arteries: Normal. Basilar artery: Normal. Vertebral arteries: Left dominant. Normal. Superior cerebellar arteries: Normal. Inferior cerebellar arteries: Normal. IMPRESSION: 1. 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 2. Normal intracranial MRA. Electronically Signed   By: Ulyses Jarred M.D.   On: 10/16/2018 03:21   US Carotid Bilateral (at [facility] And [facility] Only)  Result Date: 10/16/2018 CLINICAL DATA:  72 year old female with a history of stroke. EXAM: BILATERAL CAROTID DUPLEX ULTRASOUND TECHNIQUE: Pearline Cables scale imaging, color Doppler and duplex ultrasound were performed of bilateral carotid and vertebral arteries in the neck. COMPARISON:  None. FINDINGS: Criteria: Quantification of carotid stenosis is based on velocity parameters that correlate the residual internal carotid diameter with NASCET-based stenosis levels, using the diameter of the distal internal carotid lumen as the denominator for stenosis measurement. The following velocity measurements were obtained: RIGHT ICA:  Systolic 509 cm/sec, Diastolic 23 cm/sec CCA:  326 cm/sec SYSTOLIC ICA/CCA RATIO:  0.9 ECA:  110 cm/sec LEFT ICA:  Systolic 94 cm/sec, Diastolic 21 cm/sec CCA:  712 cm/sec SYSTOLIC ICA/CCA RATIO:  0.9 ECA:  105 cm/sec Right Brachial SBP: Not acquired Left Brachial SBP: Not acquired RIGHT CAROTID ARTERY: No significant calcifications of the right common carotid artery. Intermediate waveform maintained. Heterogeneous and partially calcified plaque at the right carotid bifurcation. No significant lumen shadowing. Low resistance waveform of the right ICA. No significant tortuosity. RIGHT VERTEBRAL ARTERY: Antegrade flow with low resistance waveform. LEFT CAROTID ARTERY: No significant calcifications of the left  common carotid artery. Intermediate waveform maintained. Heterogeneous and partially calcified plaque at the left carotid bifurcation with mild lumen shadowing. Low resistance waveform of the left ICA. No significant tortuosity. LEFT VERTEBRAL ARTERY:  Antegrade flow with low resistance waveform. IMPRESSION: Color duplex indicates minimal heterogeneous and calcified plaque, with no hemodynamically significant stenosis by duplex criteria in the extracranial cerebrovascular circulation. Signed, Dulcy Fanny. Dellia Nims, RPVI [facility] Radiology Electronically Signed   By: Corrie Mckusick D.O.   On: 10/16/2018 10:28   Mr Jodene Nam Head/brain Wo Cm  Result Date: 10/16/2018 CLINICAL DATA:  Focal neurological deficits EXAM: MRI HEAD WITHOUT CONTRAST MRA HEAD WITHOUT CONTRAST TECHNIQUE: Multiplanar, multiecho pulse sequences of the brain and surrounding structures were obtained without intravenous contrast. Angiographic images of the head were obtained using MRA technique without contrast. COMPARISON:  CTA head neck 10/15/2018 FINDINGS: MRI HEAD FINDINGS BRAIN: There is no acute infarct, acute hemorrhage or mass effect. The midline structures are normal. There are no old infarcts. Multifocal white matter hyperintensity, most commonly due to chronic ischemic microangiopathy. The CSF spaces are normal for age, with no hydrocephalus. Susceptibility-sensitive sequences show no chronic microhemorrhage or superficial siderosis. SKULL AND UPPER CERVICAL SPINE: The visualized skull base, calvarium, upper cervical spine and extracranial soft tissues are normal. SINUSES/ORBITS: No fluid levels or advanced mucosal thickening. No mastoid or middle ear effusion. The orbits are normal. MRA HEAD FINDINGS Intracranial internal carotid arteries: Normal. Anterior cerebral arteries: Variant azygous configuration. Middle cerebral arteries: Normal. Posterior communicating arteries: Present only on the  right. Posterior cerebral arteries: Normal. Basilar artery: Normal. Vertebral arteries: Left dominant. Normal. Superior cerebellar arteries: Normal. Inferior cerebellar arteries: Normal. IMPRESSION: 1. 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 2. Normal intracranial MRA. Electronically Signed   By: Ulyses Jarred M.D.   On: 10/16/2018 03:21   Ct Head Code Stroke Wo Contrast  Result Date: 10/15/2018 CLINICAL DATA:  Code stroke. Sudden onset of right-sided vision loss. Diagnosed with central retinal artery occlusion at ophthalmology office and sent to ED for further workup. EXAM: CT HEAD WITHOUT CONTRAST TECHNIQUE: Contiguous axial images were obtained from the base of the skull through the vertex without intravenous contrast. COMPARISON:  CT head without contrast 10/07/2017 FINDINGS: Brain: Mild atrophy and white matter changes are stable, within normal limits for age. No acute cortical infarct, hemorrhage, or mass lesion is present. Basal ganglia are intact. Insular ribbon is normal. The brainstem and cerebellum are normal. Vascular: Minimal cavernous carotid artery calcifications are present without significant change. There is no asymmetric hyperdensity. Skull: Calvarium is intact. No focal lytic or blastic lesions are present. Sinuses/Orbits: The paranasal sinuses and mastoid air cells are clear. Globes and orbits are unremarkable. ASPECTS [facility] Stroke Program Early CT Score) - Ganglionic level infarction (caudate, lentiform nuclei, internal capsule, insula, M1-M3 cortex): 7/7 - Supraganglionic infarction (M4-M6 cortex): 3/3 Total score (0-10 with 10 being normal): 10/10 IMPRESSION: 1. Normal CT of the head for age. 2. Atherosclerosis 3. ASPECTS is 10/10 These results were called by telephone at the time of interpretation on 10/15/2018 at 5:43 pm to Dr. Nance Pear , who verbally acknowledged these results. Electronically Signed   By: San Morelle M.D.   On: 10/15/2018 17:45      ASSESSMENT AND PLAN:   72 year old female patient with history of chronic kidney disease, diabetes mellitus type 2, hypertension, chronic lymphocytic leukemia currently under hospitalist service was referred from ophthalmology office for right eye pain and loss of vision in the right eye.  She was worked up with CTA of the head and neck and patient refused thrombolytic therapy.  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 Management plans discussed with the patient, family and they are in agreement.  CODE STATUS: Full  code  DVT Prophylaxis: SCDs  TOTAL TIME TAKING CARE OF THIS PATIENT: 34 minutes.   POSSIBLE D/C IN 1 to 2 DAYS, DEPENDING ON CLINICAL CONDITION.  Saundra Shelling M.D on 10/16/2018 at 11:11 AM  Between 7am to 6pm - Pager - [phone]  After 6pm go to [email or web address] - password [email or web address] [facility]  Office  [phone]  CC: Primary care physician; Wenda Low, MD  Note: This dictation was prepared with Dragon dictation along with smaller phrase technology. Any transcriptional errors that result from this process are unintentional.

## 2018-10-22 DIAGNOSIS — H3411 Central retinal artery occlusion, right eye: Secondary | ICD-10-CM | POA: Diagnosis not present

## 2018-10-28 DIAGNOSIS — E1122 Type 2 diabetes mellitus with diabetic chronic kidney disease: Secondary | ICD-10-CM | POA: Diagnosis not present

## 2018-10-28 DIAGNOSIS — H3411 Central retinal artery occlusion, right eye: Secondary | ICD-10-CM | POA: Diagnosis not present

## 2018-10-28 DIAGNOSIS — F419 Anxiety disorder, unspecified: Secondary | ICD-10-CM | POA: Diagnosis not present

## 2018-10-28 DIAGNOSIS — E041 Nontoxic single thyroid nodule: Secondary | ICD-10-CM | POA: Diagnosis not present

## 2018-10-30 ENCOUNTER — Inpatient Hospital Stay: Payer: Medicare HMO | Admitting: Hematology and Oncology

## 2018-10-30 ENCOUNTER — Inpatient Hospital Stay: Payer: Medicare HMO | Attending: Hematology and Oncology

## 2018-10-30 ENCOUNTER — Other Ambulatory Visit: Payer: Self-pay | Admitting: Hematology and Oncology

## 2018-10-30 ENCOUNTER — Encounter: Payer: Self-pay | Admitting: Hematology and Oncology

## 2018-10-30 VITALS — BP 169/75 | HR 83 | Temp 97.2°F | Resp 16 | Wt 260.6 lb

## 2018-10-30 DIAGNOSIS — C9110 Chronic lymphocytic leukemia of B-cell type not having achieved remission: Secondary | ICD-10-CM | POA: Diagnosis not present

## 2018-10-30 DIAGNOSIS — N189 Chronic kidney disease, unspecified: Secondary | ICD-10-CM | POA: Insufficient documentation

## 2018-10-30 DIAGNOSIS — H349 Unspecified retinal vascular occlusion: Secondary | ICD-10-CM

## 2018-10-30 DIAGNOSIS — M85851 Other specified disorders of bone density and structure, right thigh: Secondary | ICD-10-CM

## 2018-10-30 DIAGNOSIS — D509 Iron deficiency anemia, unspecified: Secondary | ICD-10-CM

## 2018-10-30 DIAGNOSIS — H3411 Central retinal artery occlusion, right eye: Secondary | ICD-10-CM

## 2018-10-30 DIAGNOSIS — D631 Anemia in chronic kidney disease: Secondary | ICD-10-CM

## 2018-10-30 LAB — CBC WITH DIFFERENTIAL/PLATELET
Abs Immature Granulocytes: 0.06 10*3/uL (ref 0.00–0.07)
Basophils Absolute: 0.1 10*3/uL (ref 0.0–0.1)
Basophils Relative: 0 %
Eosinophils Absolute: 0.1 10*3/uL (ref 0.0–0.5)
Eosinophils Relative: 0 %
HCT: 35.3 % — ABNORMAL LOW (ref 36.0–46.0)
Hemoglobin: 11.2 g/dL — ABNORMAL LOW (ref 12.0–15.0)
Immature Granulocytes: 0 %
Lymphocytes Relative: 90 %
Lymphs Abs: 36.6 10*3/uL — ABNORMAL HIGH (ref 0.7–4.0)
MCH: 30.8 pg (ref 26.0–34.0)
MCHC: 31.7 g/dL (ref 30.0–36.0)
MCV: 97 fL (ref 80.0–100.0)
Monocytes Absolute: 0.3 10*3/uL (ref 0.1–1.0)
Monocytes Relative: 1 %
Neutro Abs: 3.8 10*3/uL (ref 1.7–7.7)
Neutrophils Relative %: 9 %
Platelets: 228 10*3/uL (ref 150–400)
RBC: 3.64 MIL/uL — ABNORMAL LOW (ref 3.87–5.11)
RDW: 15.3 % (ref 11.5–15.5)
WBC: 40.9 10*3/uL — ABNORMAL HIGH (ref 4.0–10.5)
nRBC: 0 % (ref 0.0–0.2)

## 2018-10-30 LAB — BASIC METABOLIC PANEL
Anion gap: 11 (ref 5–15)
BUN: 35 mg/dL — ABNORMAL HIGH (ref 8–23)
CO2: 29 mmol/L (ref 22–32)
Calcium: 8.9 mg/dL (ref 8.9–10.3)
Chloride: 101 mmol/L (ref 98–111)
Creatinine, Ser: 1.49 mg/dL — ABNORMAL HIGH (ref 0.44–1.00)
GFR calc Af Amer: 41 mL/min — ABNORMAL LOW (ref >60)
GFR calc non Af Amer: 35 mL/min — ABNORMAL LOW (ref >60)
Glucose, Bld: 230 mg/dL — ABNORMAL HIGH (ref 70–99)
Potassium: 4 mmol/L (ref 3.5–5.1)
Sodium: 141 mmol/L (ref 135–145)

## 2018-10-30 LAB — FERRITIN: Ferritin: 63 ng/mL (ref 11–307)

## 2018-10-30 NOTE — Progress Notes (Signed)
[facility] day:  10/30/18  Chief Complaint: Tammy [name] is a 72 y.o. female with chronic lymphocytic leukemia (CLL) and a history of iron deficiency anemia who is seen for 6 month assessment.  HPI:  The patient was last seen in the medical oncology clinic on 06/19/2018.  At that time, she was doing well overall. She denied B symptoms and recent infections. She was recovering from LEFT knee surgery. She was participating in physical therapy services. Exam revealed no new areas of palpable adenopathy or hepatosplenomegaly.   Labs on 06/19/2018 included a WBC was 28,300.  [place] was 3700.  ALC was 23,500.  Retic was 1.3%.  Hemoglobin was 10.8.  Ferritin was 58.  B12 was 915.  Folate was 34.  TSH was 2.217.  Creatinine was 1.51.  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  Notes reviewed.  She presented with painless loss of vision in her RIGHT eye.  CT head was negative with no acute intracranial abnormality noted.  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  CTA head and neck reviewed and show no large vessel occlusion, hemodynamically significant stenosis, dissection or aneurysm.  Follow-up MRI of the brain also negative with no acute intracranial abnormality noted.  She was scheduled to follow-up with ophthalmology.  Echocardiogram on 10/16/2018 demonstrated an LVEF of 60-65%. Severe focal basal hypertrophy of the septum noted, which is consistent with hypertrophic cardiomyopathy.   BILATERAL carotid duplex on 10/16/2018 demonstrated minimal heterogenous and calcified plaque, with no significant stenosis.   During the interim, patient has followed by with ophthalmology. She advises that she was told that the "blood clot is occluding her vision" and that her vision "would not return". Patient has asked for a second opinion at [facility]. She has  since been referred to the ophthalmology service at [facility] by her PCP.    Past Medical History:  Diagnosis Date  . Allergic rhinitis   . Chronic kidney disease   . Chronic lymphocytic leukemia ([place])   . DA (degenerative arthritis)   . Diabetes mellitus   . Headache   . Heart murmur   . History of kidney stones   . Hyperlipidemia   . Hypertension   . Hypertriglyceridemia   . Iron deficiency anemia   . Other elevated white blood cell count   . Unspecified deficiency anemia     Past Surgical History:  Procedure Laterality Date  . ABDOMINAL HYSTERECTOMY     50 years  . COLONOSCOPY  2008   most recent  . [place]   lask  . JOINT REPLACEMENT    . JOINT REPLACEMENT    . KNEE ARTHROTOMY Left 05/15/2018   Procedure: KNEE ARTHROTOMY,LYSIS OF ADHESIONS, POLY EXCHANGE;  Surgeon: Dereck Leep, MD;  Location: [facility] ORS;  Service: Orthopedics;  Laterality: Left;  . OOPHORECTOMY    . REFRACTIVE SURGERY Bilateral 1996  . REPLACEMENT TOTAL KNEE Right 2015  . REPLACEMENT TOTAL KNEE Left 2007    Family History  Problem Relation Age of Onset  . Cancer Father        gastric cancer  . Cancer Sister        gastric cancer  . Diabetes Mother   . Hypertension Mother   . Diabetes Brother   . Diabetes Brother   . Diabetes Sister   . Breast cancer Neg Hx  Social History:  reports that she has never smoked. She has never used smokeless tobacco. She reports that she does not drink alcohol or use drugs.  She has recently been to [facility]. Her husband will be having a hip replacement soon. She lives in [place].  The patient is alone today.  Allergies:  Allergies  Allergen Reactions  . Latex Rash  . Sulfa Antibiotics Rash    Current Medications: Current Outpatient Medications  Medication Sig Dispense Refill  . acetaminophen (TYLENOL) 500 MG tablet Take 1,000 mg by mouth every 6 (six) hours as needed for moderate pain or headache.    Marland Kitchen amLODipine (NORVASC) 5 MG tablet  Take 5 mg by mouth every evening.   11  . amoxicillin (AMOXIL) 500 MG capsule TAKE 4 CAPSULES BY MOUTH 1 HOUR PRIOR TO DENTAL APPOINTMENT  0  . aspirin EC 81 MG tablet Take 1 tablet (81 mg total) by mouth daily. 30 tablet 0  . benazepril-hydrochlorthiazide (LOTENSIN HCT) 20-12.5 MG per tablet Take 1 tablet by mouth daily.     . Cholecalciferol 5000 units capsule Take 5,000 Units by mouth daily.     . ferrous sulfate 325 (65 FE) MG tablet Take 325 mg by mouth daily with breakfast.    . glimepiride (AMARYL) 4 MG tablet Take 4 mg by mouth 2 (two) times daily.     . meclizine (ANTIVERT) 25 MG tablet Take 25 mg by mouth 3 (three) times daily as needed for dizziness.    . Multiple Vitamins-Minerals (CEROVITE SENIOR) TABS Take 1 tablet by mouth daily.    . pioglitazone (ACTOS) 45 MG tablet Take 45 mg by mouth daily.     . simvastatin (ZOCOR) 10 MG tablet Take 10 mg by mouth at bedtime.   3  . sitaGLIPtin (JANUVIA) 50 MG tablet Take 50 mg by mouth daily.      No current facility-administered medications for this visit.     Review of Systems  Constitutional: Positive for weight loss (9 pounds- intentional). Negative for chills, diaphoresis, fever and malaise/fatigue.  HENT: Negative.  Negative for congestion, ear pain, nosebleeds and sinus pain.   Eyes: Negative for double vision and pain.       Loss of vision in right eye- see HPI.  Respiratory: Negative.  Negative for cough, hemoptysis, sputum production and shortness of breath.   Cardiovascular: Negative.  Negative for chest pain, palpitations, orthopnea, leg swelling and PND.       EF 60-65%.  Gastrointestinal: Negative.  Negative for abdominal pain, blood in stool, constipation, diarrhea, melena, nausea and vomiting.  Genitourinary: Negative.  Negative for dysuria, frequency and hematuria.  Musculoskeletal: Positive for joint pain (knee issues - s/p BILATERAL TKR). Negative for back pain, falls, myalgias and neck pain.  Skin: Negative.   Negative for itching and rash.  Neurological: Negative.  Negative for dizziness, tingling, tremors, sensory change, focal weakness, weakness and headaches.  Endo/Heme/Allergies: Negative.  Does not bruise/bleed easily.  Psychiatric/Behavioral: Negative.  Negative for depression and memory loss. The patient is not nervous/anxious and does not have insomnia.   All other systems reviewed and are negative.  Performance status (ECOG): 0  Vital Signs BP (!) 169/75 (BP Location: Right Arm, Patient Position: Sitting)   Pulse 83   Temp (!) 97.2 F (36.2 C) (Tympanic)   Resp 16   Wt 260 lb 9.3 oz (118.2 kg)   BMI 40.81 kg/m   Physical Exam  Constitutional: She is oriented to person, place, and time  and well-developed, well-nourished, and in no distress. No distress.  HENT:  Head: Normocephalic and atraumatic.  Mouth/Throat: Oropharynx is clear and moist. No oropharyngeal exudate.  Dark brown hair.  Eyes: Pupils are equal, round, and reactive to light. Conjunctivae and EOM are normal. No scleral icterus.  Brown eyes.  Neck: Normal range of motion. Neck supple. No JVD present.  Cardiovascular: Normal rate, regular rhythm and normal heart sounds. Exam reveals no gallop and no friction rub.  No murmur heard. Pulmonary/Chest: Effort normal and breath sounds normal. No respiratory distress. She has no wheezes. She has no rales.  Abdominal: Soft. Bowel sounds are normal. She exhibits no distension and no mass. There is no abdominal tenderness. There is no rebound and no guarding.  Musculoskeletal: Normal range of motion.        General: No tenderness or edema.     Comments: Wearing support stockings.  Lymphadenopathy:    She has no cervical adenopathy.    She has no axillary adenopathy.       Right: No inguinal and no supraclavicular adenopathy present.       Left: No inguinal and no supraclavicular adenopathy present.  Neurological: She is alert and oriented to person, place, and time. Gait  normal.  Skin: Skin is warm and dry. No rash noted. She is not diaphoretic. No erythema. No pallor.  Psychiatric: Mood, affect and judgment normal.  Nursing note and vitals reviewed.   Appointment on 10/30/2018  Component Date Value Ref Range Status  . WBC 10/30/2018 40.9* 4.0 - 10.5 K/uL Final  . RBC 10/30/2018 3.64* 3.87 - 5.11 MIL/uL Final  . Hemoglobin 10/30/2018 11.2* 12.0 - 15.0 g/dL Final  . HCT 10/30/2018 35.3* 36.0 - 46.0 % Final  . MCV 10/30/2018 97.0  80.0 - 100.0 fL Final  . MCH 10/30/2018 30.8  26.0 - 34.0 pg Final  . MCHC 10/30/2018 31.7  30.0 - 36.0 g/dL Final  . RDW 10/30/2018 15.3  11.5 - 15.5 % Final  . Platelets 10/30/2018 228  150 - 400 K/uL Final  . nRBC 10/30/2018 0.0  0.0 - 0.2 % Final  . Neutrophils Relative % 10/30/2018 9  % Final  . Neutro Abs 10/30/2018 3.8  1.7 - 7.7 K/uL Final  . Lymphocytes Relative 10/30/2018 90  % Final  . Lymphs Abs 10/30/2018 36.6* 0.7 - 4.0 K/uL Final  . Monocytes Relative 10/30/2018 1  % Final  . Monocytes Absolute 10/30/2018 0.3  0.1 - 1.0 K/uL Final  . Eosinophils Relative 10/30/2018 0  % Final  . Eosinophils Absolute 10/30/2018 0.1  0.0 - 0.5 K/uL Final  . Basophils Relative 10/30/2018 0  % Final  . Basophils Absolute 10/30/2018 0.1  0.0 - 0.1 K/uL Final  . Immature Granulocytes 10/30/2018 0  % Final  . Abs Immature Granulocytes 10/30/2018 0.06  0.00 - 0.07 K/uL Final   Performed at [facility], [address], [place], [place] [address]  . Sodium 10/30/2018 141  135 - 145 mmol/L Final  . Potassium 10/30/2018 4.0  3.5 - 5.1 mmol/L Final  . Chloride 10/30/2018 101  98 - 111 mmol/L Final  . CO2 10/30/2018 29  22 - 32 mmol/L Final  . Glucose, Bld 10/30/2018 230* 70 - 99 mg/dL Final  . BUN 10/30/2018 35* 8 - 23 mg/dL Final  . Creatinine, Ser 10/30/2018 1.49* 0.44 - 1.00 mg/dL Final  . Calcium 10/30/2018 8.9  8.9 - 10.3 mg/dL Final  . GFR calc non Af Wyvonnia Lora  10/30/2018 35* >60 mL/min Final  . GFR calc Af Amer  10/30/2018 41* >60 mL/min Final  . Anion gap 10/30/2018 11  5 - 15 Final   Performed at [facility], [address] [place], [place] [address]    Assessment:  Tammy [name] is a 72 y.o. female with stage 0 CLL diagnosed in 07/2009.  She presented with lymphocytosis.  WBC has ranged between 13,600 -. 32,500 without a trend.  Platelet count is normal.  She has had no issues with bruising or bleeding.    She has a history of iron deficiency anemia.  Diet is good (she eats meat 4 times/week).  She is on oral iron (ferrous sulfate) every other day.  She denies any bleeding.  Colonoscopy is due in 2018.  Guaiac cards were - x 3 in 05/2015.  She has chronic kidney disease (CrCl 46 ml/min).  B12 and folate were normal on 06/14/2016.  Mammogram on 10/04/2016 was negative.  Colonoscopy on 07/12/2017 revealed three 4-6 mm polyps.  Pathology revealed tubular adenomas in the ascending colon and rectum.  The cecum polyp was polypoid colonic mucosa negative for dysplasia.  Repeat colonoscopy is planned in 5 years.  Echocardiogram on 10/16/2018 demonstrated an LVEF of 60-65%. Severe focal basal hypertrophy of the septum noted, which is consistent with hypertrophic cardiomyopathy.   BILATERAL carotid duplex on 10/16/2018 demonstrated minimal heterogenous and calcified plaque, with no significant stenosis.   Admitted to [facility] from 10/15/2018 - 10/16/2018 with central retinal artery occlusion.  Presented with painless loss of vision in her RIGHT eye.  CT head was negative with no acute intracranial abnormality noted. MRI of the brain was negative with no acute intracranial abnormality.    Symptomatically, she denies any B symptoms.  She has lost weight intentionally.  Exam reveals no adenopathy or hepatosplenomegaly.  WBC is 40,900.  Hemoglobin is 11.2.    Plan: 1. Labs today: CBC with diff, BMP, ferritin, lupus anticoagulant panel, anticardiolipin antibodies, beta2-glycoprotein, factor V Leiden  prothrombin gene mutation. 2. Chronic lymphocytic leukemia  Clinically doing well. WBC 40,900.  Platelet count 228,000. Review indications for treatment: B symptoms, bulky adenopathy, end organ involvement.  Continue surveillance.  3. Normocytic anemia Hemoglobin 11.2 and hematocrit 35.3.  Patient has chronic kidney disease. Review work-up from 06/03/2018 with normal ferritin, B12, folate, TSH. Colonoscopy on 07/12/2017 (next in 06/2022). 4. Retinal artery occlusion- new  Review hospitalization and work-up to date.  Discuss hypercoagulable work-up. 5. Health maintenance Mammogram on 10/14/2018 revealed no evidence of malignancy. Bone density on 10/14/2018 revealed osteopenia with a T-score of -1.2 in the right femoral neck. 6. CKD (decreased renal function) BUN 35 and creatinine 1.49.  7. RTC in 3 months for MD assessment and labs (CBC with diff, BMP, ferritin).    Honor Loh, NP  10/30/2018, 11:50 AM   I saw and evaluated the patient, participating in the key portions of the service and reviewing pertinent diagnostic studies and records.  I reviewed the nurse practitioner's note and agree with the findings and the plan.  The assessment and plan were discussed with the patient.  Multiple questions were asked by the patient and answered.   Nolon Stalls, MD 10/30/2018,11:50 AM

## 2018-10-30 NOTE — Progress Notes (Signed)
Pt. Denies any complaints at this time.  

## 2018-10-31 LAB — CARDIOLIPIN ANTIBODIES, IGG, IGM, IGA
Anticardiolipin IgA: <9 APL U/mL (ref 0–11)
Anticardiolipin IgG: <9 GPL U/mL (ref 0–14)
Anticardiolipin IgM: <9 MPL U/mL (ref 0–12)

## 2018-11-01 LAB — BETA-2-GLYCOPROTEIN I ABS, IGG/M/A
Beta-2 Glyco I IgG: <9 GPI IgG units (ref 0–20)
Beta-2-Glycoprotein I IgA: <9 GPI IgA units (ref 0–25)
Beta-2-Glycoprotein I IgM: <9 GPI IgM units (ref 0–32)

## 2018-11-01 LAB — LUPUS ANTICOAGULANT PANEL
DRVVT: 35.9 s (ref 0.0–47.0)
PTT Lupus Anticoagulant: 29.4 s (ref 0.0–51.9)

## 2018-11-04 ENCOUNTER — Other Ambulatory Visit: Payer: Self-pay | Admitting: Internal Medicine

## 2018-11-04 DIAGNOSIS — E041 Nontoxic single thyroid nodule: Secondary | ICD-10-CM

## 2018-11-04 LAB — PROTHROMBIN GENE MUTATION

## 2018-11-04 LAB — FACTOR 5 LEIDEN

## 2018-11-06 DIAGNOSIS — Z794 Long term (current) use of insulin: Secondary | ICD-10-CM | POA: Diagnosis not present

## 2018-11-06 DIAGNOSIS — H33321 Round hole, right eye: Secondary | ICD-10-CM | POA: Diagnosis not present

## 2018-11-06 DIAGNOSIS — E119 Type 2 diabetes mellitus without complications: Secondary | ICD-10-CM | POA: Diagnosis not present

## 2018-11-06 DIAGNOSIS — H2513 Age-related nuclear cataract, bilateral: Secondary | ICD-10-CM | POA: Diagnosis not present

## 2018-11-06 DIAGNOSIS — H33311 Horseshoe tear of retina without detachment, right eye: Secondary | ICD-10-CM | POA: Diagnosis not present

## 2018-11-06 DIAGNOSIS — H3411 Central retinal artery occlusion, right eye: Secondary | ICD-10-CM | POA: Diagnosis not present

## 2018-11-07 ENCOUNTER — Other Ambulatory Visit: Payer: Medicare HMO

## 2018-12-18 ENCOUNTER — Ambulatory Visit: Payer: Medicare HMO | Admitting: Hematology and Oncology

## 2018-12-18 ENCOUNTER — Other Ambulatory Visit: Payer: Medicare HMO

## 2019-01-29 ENCOUNTER — Inpatient Hospital Stay: Payer: Medicare Other

## 2019-01-29 ENCOUNTER — Inpatient Hospital Stay: Payer: Medicare Other | Attending: Hematology and Oncology | Admitting: Urgent Care

## 2019-01-29 ENCOUNTER — Other Ambulatory Visit: Payer: Self-pay

## 2019-01-29 VITALS — BP 157/72 | HR 78 | Temp 97.5°F | Resp 16 | Wt 270.7 lb

## 2019-01-29 DIAGNOSIS — E119 Type 2 diabetes mellitus without complications: Secondary | ICD-10-CM | POA: Insufficient documentation

## 2019-01-29 DIAGNOSIS — N189 Chronic kidney disease, unspecified: Secondary | ICD-10-CM | POA: Insufficient documentation

## 2019-01-29 DIAGNOSIS — D649 Anemia, unspecified: Secondary | ICD-10-CM | POA: Diagnosis not present

## 2019-01-29 DIAGNOSIS — C9110 Chronic lymphocytic leukemia of B-cell type not having achieved remission: Secondary | ICD-10-CM | POA: Insufficient documentation

## 2019-01-29 DIAGNOSIS — E041 Nontoxic single thyroid nodule: Secondary | ICD-10-CM

## 2019-01-29 DIAGNOSIS — Z79899 Other long term (current) drug therapy: Secondary | ICD-10-CM | POA: Diagnosis not present

## 2019-01-29 DIAGNOSIS — I10 Essential (primary) hypertension: Secondary | ICD-10-CM | POA: Insufficient documentation

## 2019-01-29 DIAGNOSIS — H3411 Central retinal artery occlusion, right eye: Secondary | ICD-10-CM

## 2019-01-29 DIAGNOSIS — Z794 Long term (current) use of insulin: Secondary | ICD-10-CM | POA: Diagnosis not present

## 2019-01-29 DIAGNOSIS — Z90710 Acquired absence of both cervix and uterus: Secondary | ICD-10-CM | POA: Diagnosis not present

## 2019-01-29 DIAGNOSIS — H33311 Horseshoe tear of retina without detachment, right eye: Secondary | ICD-10-CM

## 2019-01-29 LAB — CBC WITH DIFFERENTIAL/PLATELET
Abs Immature Granulocytes: 0.07 10*3/uL (ref 0.00–0.07)
Basophils Absolute: 0.1 10*3/uL (ref 0.0–0.1)
Basophils Relative: 0 %
Eosinophils Absolute: 0.1 10*3/uL (ref 0.0–0.5)
Eosinophils Relative: 0 %
HCT: 34.5 % — ABNORMAL LOW (ref 36.0–46.0)
Hemoglobin: 11 g/dL — ABNORMAL LOW (ref 12.0–15.0)
Immature Granulocytes: 0 %
Lymphocytes Relative: 84 %
Lymphs Abs: 22 10*3/uL — ABNORMAL HIGH (ref 0.7–4.0)
MCH: 31.3 pg (ref 26.0–34.0)
MCHC: 31.9 g/dL (ref 30.0–36.0)
MCV: 98.3 fL (ref 80.0–100.0)
Monocytes Absolute: 0.9 10*3/uL (ref 0.1–1.0)
Monocytes Relative: 3 %
Neutro Abs: 3.6 10*3/uL (ref 1.7–7.7)
Neutrophils Relative %: 13 %
Platelets: 189 10*3/uL (ref 150–400)
RBC: 3.51 MIL/uL — ABNORMAL LOW (ref 3.87–5.11)
RDW: 15.3 % (ref 11.5–15.5)
WBC: 26.7 10*3/uL — ABNORMAL HIGH (ref 4.0–10.5)
nRBC: 0 % (ref 0.0–0.2)

## 2019-01-29 LAB — BASIC METABOLIC PANEL
Anion gap: 9 (ref 5–15)
BUN: 31 mg/dL — ABNORMAL HIGH (ref 8–23)
CO2: 26 mmol/L (ref 22–32)
Calcium: 8.9 mg/dL (ref 8.9–10.3)
Chloride: 105 mmol/L (ref 98–111)
Creatinine, Ser: 1.37 mg/dL — ABNORMAL HIGH (ref 0.44–1.00)
GFR calc Af Amer: 45 mL/min — ABNORMAL LOW (ref >60)
GFR calc non Af Amer: 38 mL/min — ABNORMAL LOW (ref >60)
Glucose, Bld: 188 mg/dL — ABNORMAL HIGH (ref 70–99)
Potassium: 3.9 mmol/L (ref 3.5–5.1)
Sodium: 140 mmol/L (ref 135–145)

## 2019-01-29 LAB — FERRITIN: Ferritin: 43 ng/mL (ref 11–307)

## 2019-01-29 NOTE — Progress Notes (Signed)
Pt here for follow up. Denies any concerns at this time.  

## 2019-01-29 NOTE — Progress Notes (Signed)
[facility] day:  01/29/19  Chief Complaint: Tammy [name] is a 73 y.o. female with chronic lymphocytic leukemia (CLL) and a history of iron deficiency anemia who is seen for 3 month assessment.  HPI:  The patient was last seen in the medical oncology clinic on 10/30/2018.  At that time, patient was having difficulties with her vision.  Patient was being seen by ophthalmology at [facility].  No B symptoms or recent infections.  She was recovering from her knee surgery well.  Exam revealed no palpable adenopathy or hepatosplenomegaly.  WBC 40,900 (ANC 3800, ALC 36, 600).  BUN 35 and creatinine 1.49 mg/dL.  Glucose elevated at 230 mg/dL.  Additional lab studies were ordered.  Additional labs on 10/30/2018: Ferritin was normal at 63 ng/mL.  There was no lupus anticoagulant detected.  Cardiolipin antibodies normal.  Beta-2 glycoproteins normal.  Factor V Leiden negative.  Prothrombin gene mutation negative.  Patient seen and consult by ophthalmology at [facility]  Notes reviewed.  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  Patient undergoing continued evaluation with [facility], as well as at [facility].  Next appointment is scheduled for the end of 01/2019 or early 02/2019.  In the interim, patient doing well overall.  She has fully recovered from her previous knee surgery, and has completed physical therapy.  She denies any acute concerns today. Patient denies that she has experienced any B symptoms. She denies any interval infections.   Patient advises that she maintains an adequate appetite. She is eating well. Weight today is 270 lb 11.6 oz (122.8 kg), which compared to her last visit to the clinic, represents a 10 pound weight gain.  Patient denies pain in the clinic today.  Past Medical History:  Diagnosis Date  . Allergic rhinitis   . Chronic kidney disease   . Chronic lymphocytic leukemia ([place])   . DA (degenerative  arthritis)   . Diabetes mellitus   . Headache   . Heart murmur   . History of kidney stones   . Hyperlipidemia   . Hypertension   . Hypertriglyceridemia   . Iron deficiency anemia   . Other elevated white blood cell count   . Unspecified deficiency anemia     Past Surgical History:  Procedure Laterality Date  . ABDOMINAL HYSTERECTOMY     50 years  . COLONOSCOPY  2008   most recent  . [place]   lask  . JOINT REPLACEMENT    . JOINT REPLACEMENT    . KNEE ARTHROTOMY Left 05/15/2018   Procedure: KNEE ARTHROTOMY,LYSIS OF ADHESIONS, POLY EXCHANGE;  Surgeon: Dereck Leep, MD;  Location: [facility] ORS;  Service: Orthopedics;  Laterality: Left;  . OOPHORECTOMY    . REFRACTIVE SURGERY Bilateral 1996  . REPLACEMENT TOTAL KNEE Right 2015  . REPLACEMENT TOTAL KNEE Left 2007    Family History  Problem Relation Age of Onset  . Cancer Father        gastric cancer  . Cancer Sister        gastric cancer  . Diabetes Mother   . Hypertension Mother   . Diabetes Brother   . Diabetes Brother   . Diabetes Sister   . Breast cancer Neg Hx     Social History:  reports that she has never smoked. She has never used smokeless tobacco. She reports that she does not drink alcohol or use drugs.  She has recently  been to [facility]. Her husband will be having a hip replacement soon. She lives in [place].  The patient is alone today.  Allergies:  Allergies  Allergen Reactions  . Latex Rash  . Sulfa Antibiotics Rash    Current Medications: Current Outpatient Medications  Medication Sig Dispense Refill  . acetaminophen (TYLENOL) 500 MG tablet Take 1,000 mg by mouth every 6 (six) hours as needed for moderate pain or headache.    . albuterol (PROVENTIL HFA;VENTOLIN HFA) 108 (90 Base) MCG/ACT inhaler TAKE 2 PUFFS BY MOUTH EVERY 4 HOURS AS NEEDED FOR COUGHING OR WHEEZING    . amLODipine (NORVASC) 5 MG tablet Take 5 mg by mouth every evening.   11  . amoxicillin (AMOXIL) 500 MG capsule  TAKE 4 CAPSULES BY MOUTH 1 HOUR PRIOR TO DENTAL APPOINTMENT  0  . benazepril-hydrochlorthiazide (LOTENSIN HCT) 20-12.5 MG per tablet Take 1 tablet by mouth daily.     . Cholecalciferol 5000 units capsule Take 5,000 Units by mouth daily.     . ferrous sulfate 325 (65 FE) MG tablet Take 325 mg by mouth daily with breakfast.    . insulin degludec (TRESIBA FLEXTOUCH) 100 UNIT/ML SOPN FlexTouch Pen Inject 10 Units into the skin daily.    . meclizine (ANTIVERT) 25 MG tablet Take 25 mg by mouth 3 (three) times daily as needed for dizziness.    . Multiple Vitamins-Minerals (CEROVITE SENIOR) TABS Take 1 tablet by mouth daily.    . pioglitazone (ACTOS) 45 MG tablet Take 45 mg by mouth daily.     . simvastatin (ZOCOR) 10 MG tablet Take 10 mg by mouth at bedtime.   3  . sitaGLIPtin (JANUVIA) 50 MG tablet Take 50 mg by mouth daily.      No current facility-administered medications for this visit.     Review of Systems  Constitutional: Negative for chills, diaphoresis, fever, malaise/fatigue and weight loss (up 10 pounds).       "I am doing good".  HENT: Negative.  Negative for congestion, ear pain, nosebleeds and sinus pain.   Eyes: Negative for double vision and pain.       CRAO and retinal tear - followed by ophthalmology  Respiratory: Negative.  Negative for cough, hemoptysis, sputum production and shortness of breath.   Cardiovascular: Negative.  Negative for chest pain, palpitations, orthopnea, leg swelling and PND.       EF 60-65%.  Gastrointestinal: Negative.  Negative for abdominal pain, blood in stool, constipation, diarrhea, melena, nausea and vomiting.  Genitourinary: Negative.  Negative for dysuria, frequency and hematuria.  Musculoskeletal: Negative for back pain, falls, joint pain (knee pain improved s/p surgery), myalgias and neck pain.  Skin: Negative.  Negative for itching and rash.  Neurological: Negative.  Negative for dizziness, tingling, tremors, sensory change, focal weakness,  weakness and headaches.  Endo/Heme/Allergies: Negative.  Does not bruise/bleed easily.  Psychiatric/Behavioral: Negative.  Negative for depression and memory loss. The patient is not nervous/anxious and does not have insomnia.   All other systems reviewed and are negative.  Performance status (ECOG): 0 - Asymptomatic  Vital Signs BP (!) 157/72 (BP Location: Right Arm, Patient Position: Sitting)   Pulse 78   Temp (!) 97.5 F (36.4 C) (Tympanic)   Resp 16   Wt 270 lb 11.6 oz (122.8 kg)   SpO2 100%   BMI 42.40 kg/m   Physical Exam  Constitutional: She is oriented to person, place, and time and well-developed, well-nourished, and in no distress.  HENT:  Head: Normocephalic and atraumatic.  Mouth/Throat: Oropharynx is clear and moist and mucous membranes are normal.  Neck: Normal range of motion. Neck supple. No tracheal deviation present. No thyromegaly present.  Cardiovascular: Normal rate, regular rhythm, normal heart sounds and intact distal pulses. Exam reveals no gallop and no friction rub.  No murmur heard. Pulmonary/Chest: Effort normal and breath sounds normal. No respiratory distress. She has no wheezes. She has no rales.  Abdominal: Soft. Bowel sounds are normal. She exhibits no distension. There is no abdominal tenderness.  Musculoskeletal: Normal range of motion.        General: No tenderness or edema.  Lymphadenopathy:    She has no cervical adenopathy.    She has no axillary adenopathy.       Right: No inguinal and no supraclavicular adenopathy present.       Left: No inguinal and no supraclavicular adenopathy present.  Neurological: She is alert and oriented to person, place, and time.  Skin: Skin is warm and dry. No rash noted. No erythema.  Psychiatric: Mood, affect and judgment normal.  Nursing note and vitals reviewed.   Appointment on 01/29/2019  Component Date Value Ref Range Status  . Ferritin 01/29/2019 43  11 - 307 ng/mL Final   Performed at [facility], [address] [place], [place] [address]  . Sodium 01/29/2019 140  135 - 145 mmol/L Final  . Potassium 01/29/2019 3.9  3.5 - 5.1 mmol/L Final  . Chloride 01/29/2019 105  98 - 111 mmol/L Final  . CO2 01/29/2019 26  22 - 32 mmol/L Final  . Glucose, Bld 01/29/2019 188* 70 - 99 mg/dL Final  . BUN 01/29/2019 31* 8 - 23 mg/dL Final  . Creatinine, Ser 01/29/2019 1.37* 0.44 - 1.00 mg/dL Final  . Calcium 01/29/2019 8.9  8.9 - 10.3 mg/dL Final  . GFR calc non Af Amer 01/29/2019 38* >60 mL/min Final  . GFR calc Af Amer 01/29/2019 45* >60 mL/min Final  . Anion gap 01/29/2019 9  5 - 15 Final   Performed at [facility], [address] [place], [place] [address]  . WBC 01/29/2019 26.7* 4.0 - 10.5 K/uL Final  . RBC 01/29/2019 3.51* 3.87 - 5.11 MIL/uL Final  . Hemoglobin 01/29/2019 11.0* 12.0 - 15.0 g/dL Final  . HCT 01/29/2019 34.5* 36.0 - 46.0 % Final  . MCV 01/29/2019 98.3  80.0 - 100.0 fL Final  . MCH 01/29/2019 31.3  26.0 - 34.0 pg Final  . MCHC 01/29/2019 31.9  30.0 - 36.0 g/dL Final  . RDW 01/29/2019 15.3  11.5 - 15.5 % Final  . Platelets 01/29/2019 189  150 - 400 K/uL Final  . nRBC 01/29/2019 0.0  0.0 - 0.2 % Final  . Neutrophils Relative % 01/29/2019 13  % Final  . Neutro Abs 01/29/2019 3.6  1.7 - 7.7 K/uL Final  . Lymphocytes Relative 01/29/2019 84  % Final  . Lymphs Abs 01/29/2019 22.0* 0.7 - 4.0 K/uL Final  . Monocytes Relative 01/29/2019 3  % Final  . Monocytes Absolute 01/29/2019 0.9  0.1 - 1.0 K/uL Final  . Eosinophils Relative 01/29/2019 0  % Final  . Eosinophils Absolute 01/29/2019 0.1  0.0 - 0.5 K/uL Final  . Basophils Relative 01/29/2019 0  % Final  . Basophils Absolute 01/29/2019 0.1  0.0 - 0.1 K/uL Final  . WBC Morphology 01/29/2019 ABSOLUTE LYMPHOCYTOSIS   Final  . RBC Morphology 01/29/2019 MORPHOLOGY UNREMARKABLE   Final  . Smear Review  01/29/2019 MORPHOLOGY UNREMARKABLE   Final  . Immature Granulocytes 01/29/2019 0  % Final  . Abs Immature  Granulocytes 01/29/2019 0.07  0.00 - 0.07 K/uL Final  . Smudge Cells 01/29/2019 PRESENT   Final   Performed at [facility], [address], [place], [place] [address]    Assessment:  LONDAN COPLEN is a 74 y.o. female with stage 0 CLL diagnosed in 07/2009.  She presented with lymphocytosis.  WBC has ranged between 13,600 -. 32,500 without a trend.  Platelet count is normal.  She has had no issues with bruising or bleeding.    She has a history of iron deficiency anemia.  Diet is good (she eats meat 4 times/week).  She is on oral iron (ferrous sulfate) every other day.  She denies any bleeding.  Colonoscopy is due in 2018.  Guaiac cards were - x 3 in 05/2015.  She has chronic kidney disease (CrCl 46 ml/min).  B12 and folate were normal on 06/14/2016.  Mammogram on 10/04/2016 was negative.  Colonoscopy on 07/12/2017 revealed three 4-6 mm polyps.  Pathology revealed tubular adenomas in the ascending colon and rectum.  The cecum polyp was polypoid colonic mucosa negative for dysplasia.  Repeat colonoscopy is planned in 5 years.  Echocardiogram on 10/16/2018 demonstrated an LVEF of 60-65%. Severe focal basal hypertrophy of the septum noted, which is consistent with hypertrophic cardiomyopathy.   BILATERAL carotid duplex on 10/16/2018 demonstrated minimal heterogenous and calcified plaque, with no significant stenosis.   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  Presented with painless loss of vision in her RIGHT eye.  CT head was negative with no acute intracranial abnormality noted. MRI of the brain was negative with no acute intracranial abnormality.    Symptomatically, patient is doing well overall.  She denies any acute concerns.  No B symptoms or recent infections.  Continues to have visual deficits in RIGHT eye secondary to CRAO and retinal tear.  She is being followed by ophthalmology at [facility].  She is fully recovered from her knee surgery,  and has completed physical therapy.  No recurrent knee pain.  Exam is grossly unchanged from previous.  WBC 26,700 (ANC 3600, ALC 22,000).  Hemoglobin 11.0, hematocrit 34.5, and platelets 189,000.  BUN 31 and creatinine 1.37 mg/dL.  Ferritin 43 ng/mL.   Plan: 1. Labs today: CBC with diff, BMP, ferritin 2. Chronic lymphocytic leukemia (CLL)  Clinically doing well.  No symptoms.  No recent infections.  WBC has dramatically improved to 26,700 (ANC 3600, ALC 22,000).  Platelets stable 189,000.  No indication for treatment at this time.  Review indications for treatment with patient: development of B symptoms, bulky/disfiguring adenopathy, involvement of other organ systems.  Discuss continued surveillance. 3. Normocytic anemia  Labs reviewed.    Hemoglobin 11.0 and hematocrit 34.5 - stable.   RBC indices normal.  Ferritin 43 ng/mL.  Etiology related to underlying chronic renal disease.  No gastrointestinal bleeding -next colonoscopy in 06/2022.  Continue routine lab monitoring. 4. CRAO and retinal tear  Continued RIGHT sided visual deficits.  Followed by ophthalmology at [facility] and at [facility].   Hypercoagulable work-up negative.  Continue to follow-up with ophthalmology as already scheduled. 5. CKD  Labs reviewed.  BUN 31 and creatinine 1.37 mg/dL. Estimated Creatinine Clearance: 50.5 mL/min (A) (by C-G formula based on SCr of 1.37 mg/dL (H)).   Followed by nephrology. 6. RTC in 3 months for labs (CBC with differential,  BMP) 7. RTC in 6 months for MD assessment and labs (CBC with diff, BMP, ferritin).    Honor Loh, NP  01/29/2019, 6:38 PM

## 2019-02-04 IMAGING — CR DG KNEE COMPLETE 4+V*L*
4 series · 4 of 4 positions shown · non-contrast
Comparison: June 02, 2009

CLINICAL DATA: Pain following fall

EXAM:
LEFT KNEE - COMPLETE 4+ VIEW

[t knee ap left]
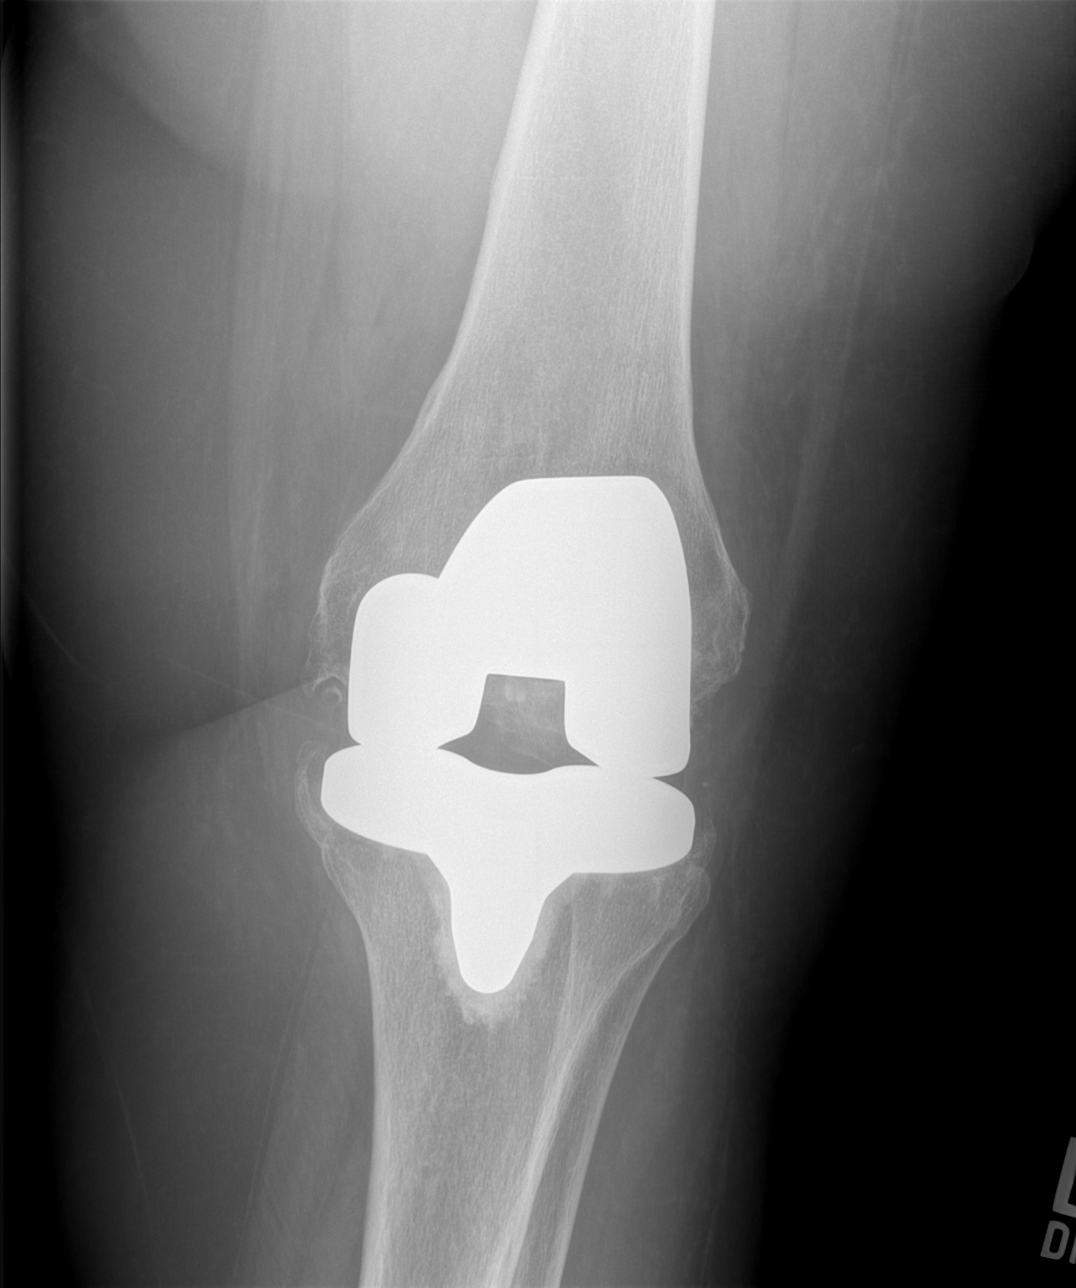

[t knee oblique left (1 of 2)]
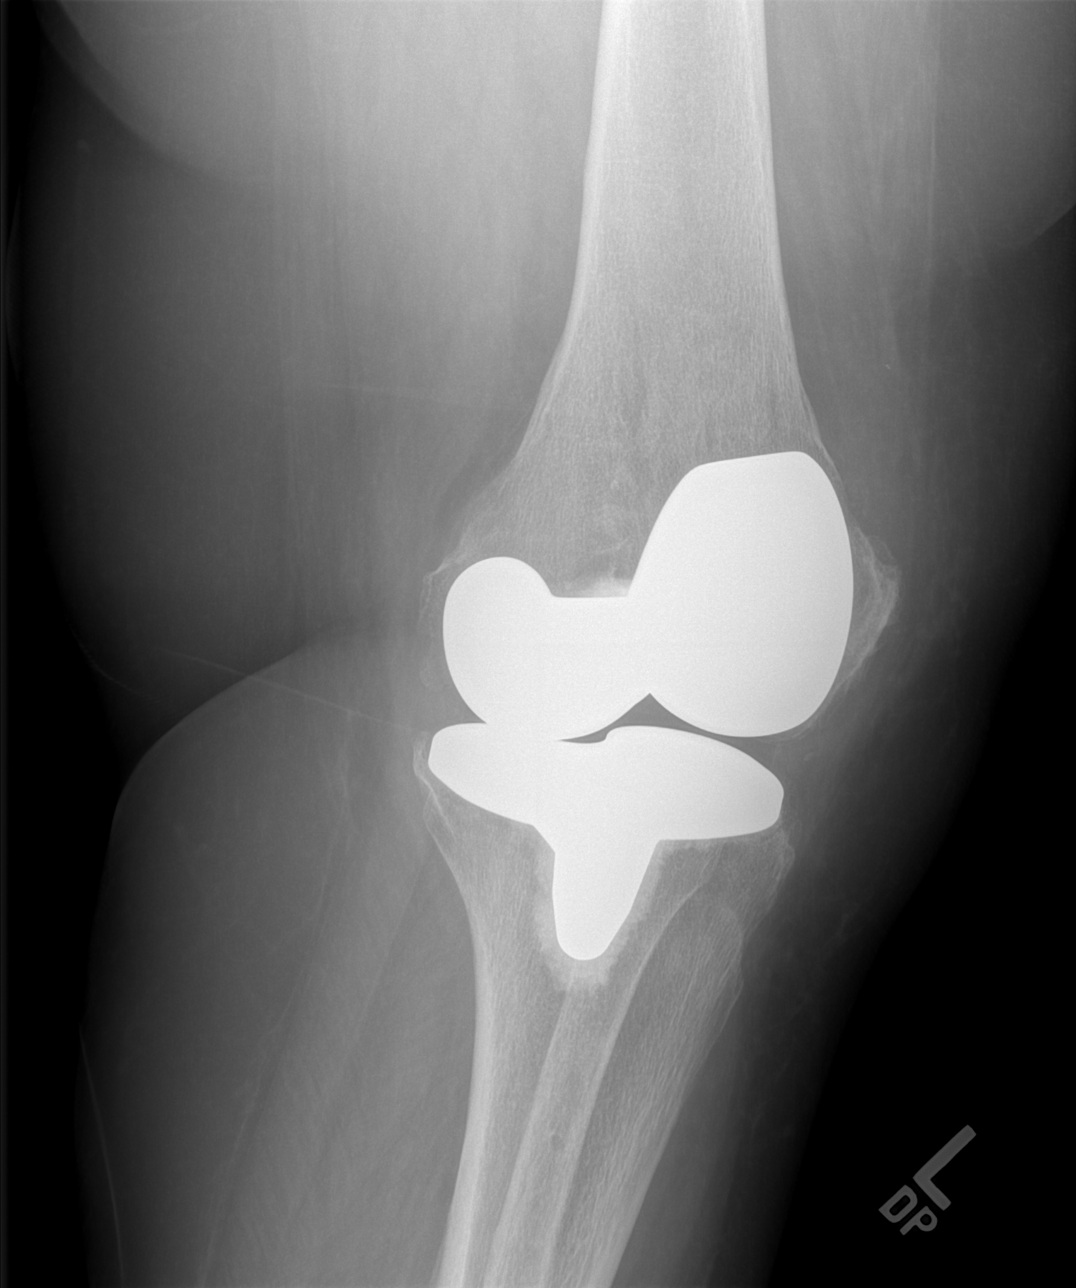

[t knee oblique left (2 of 2)]
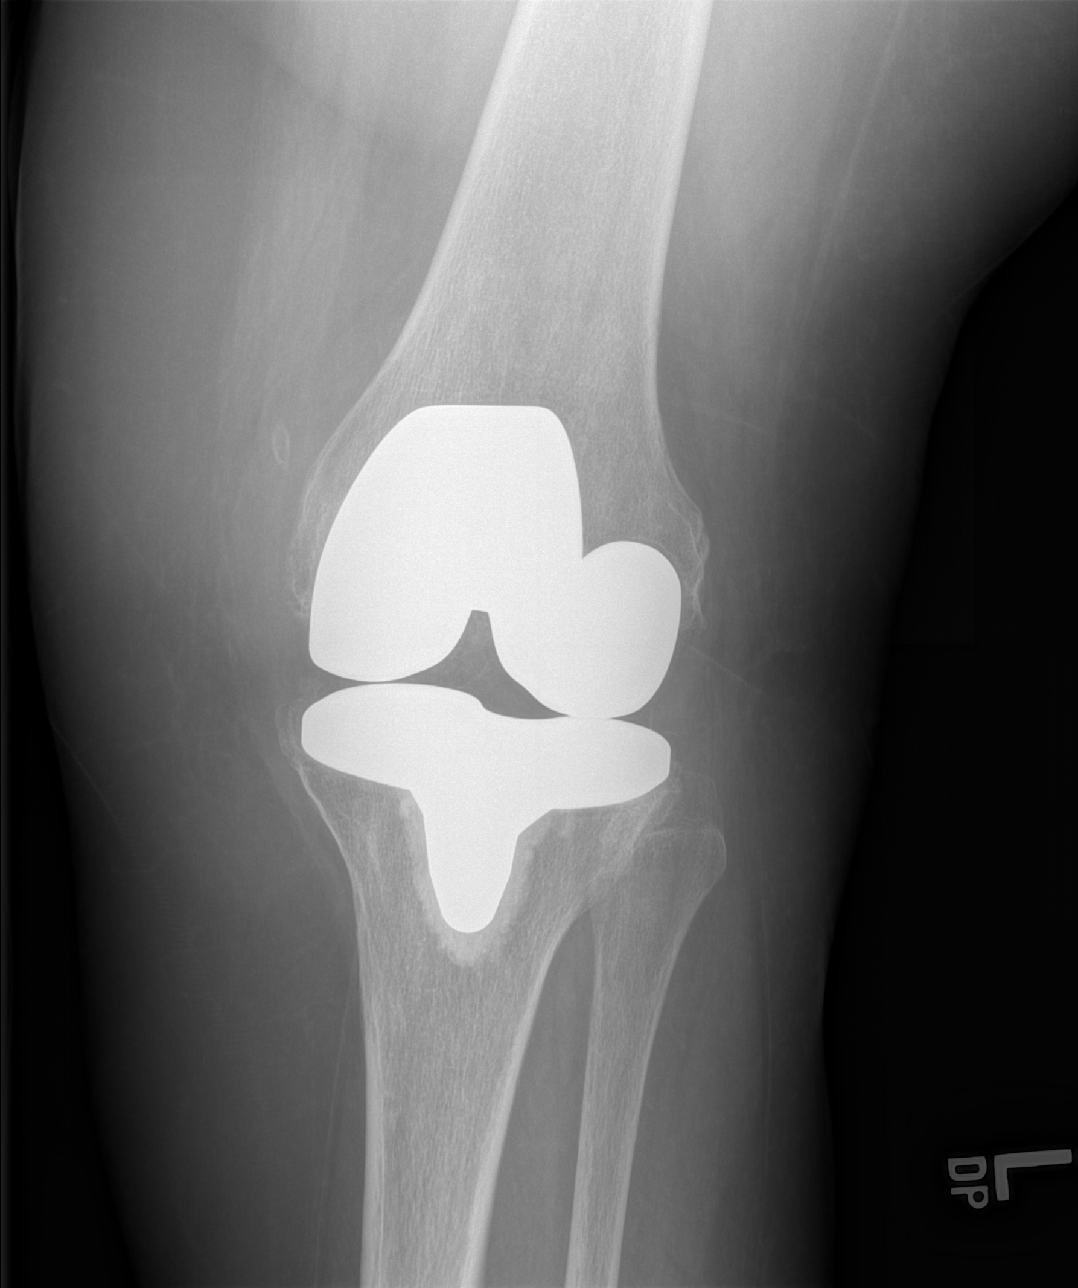

[t knee lat left]
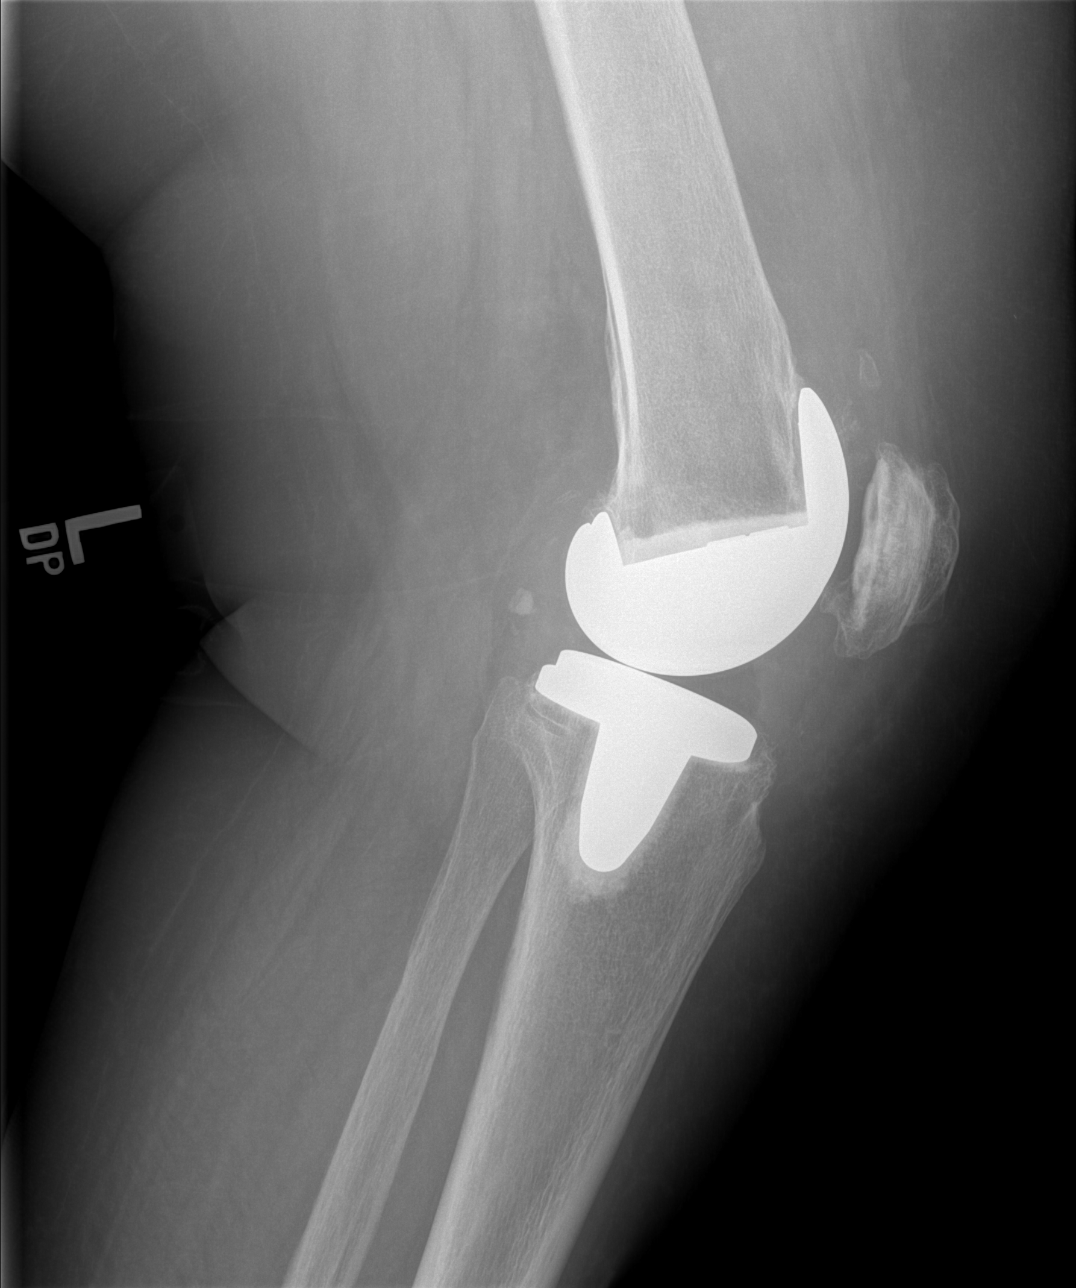

[4 of 4 positions shown; findings below may reference images not displayed]

FINDINGS: Frontal, lateral, and bilateral oblique views were obtained. Patient
is status post total knee replacement with femoral and tibial
prosthetic components well-seated. There is no appreciable fracture
or dislocation. No appreciable joint effusion. There is
calcification in the suprapatellar bursa, stable. No erosive change.
IMPRESSION: Total knee replacement prosthetic components well-seated. No
fracture or joint effusion. Calcification in the suprapatellar bursa
probably has inflammatory etiology or represents residua of previous
trauma.

## 2019-04-29 ENCOUNTER — Other Ambulatory Visit: Payer: Self-pay

## 2019-04-30 ENCOUNTER — Inpatient Hospital Stay: Payer: Medicare Other | Attending: Hematology and Oncology

## 2019-04-30 DIAGNOSIS — I10 Essential (primary) hypertension: Secondary | ICD-10-CM | POA: Insufficient documentation

## 2019-04-30 DIAGNOSIS — N189 Chronic kidney disease, unspecified: Secondary | ICD-10-CM | POA: Insufficient documentation

## 2019-04-30 DIAGNOSIS — Z90710 Acquired absence of both cervix and uterus: Secondary | ICD-10-CM | POA: Diagnosis not present

## 2019-04-30 DIAGNOSIS — E119 Type 2 diabetes mellitus without complications: Secondary | ICD-10-CM | POA: Diagnosis not present

## 2019-04-30 DIAGNOSIS — Z794 Long term (current) use of insulin: Secondary | ICD-10-CM | POA: Insufficient documentation

## 2019-04-30 DIAGNOSIS — D649 Anemia, unspecified: Secondary | ICD-10-CM | POA: Diagnosis not present

## 2019-04-30 DIAGNOSIS — Z79899 Other long term (current) drug therapy: Secondary | ICD-10-CM | POA: Diagnosis not present

## 2019-04-30 DIAGNOSIS — C9110 Chronic lymphocytic leukemia of B-cell type not having achieved remission: Secondary | ICD-10-CM

## 2019-04-30 LAB — CBC WITH DIFFERENTIAL/PLATELET
Abs Immature Granulocytes: 0.02 10*3/uL (ref 0.00–0.07)
Basophils Absolute: 0.1 10*3/uL (ref 0.0–0.1)
Basophils Relative: 0 %
Eosinophils Absolute: 0.1 10*3/uL (ref 0.0–0.5)
Eosinophils Relative: 0 %
HCT: 29.8 % — ABNORMAL LOW (ref 36.0–46.0)
Hemoglobin: 9.8 g/dL — ABNORMAL LOW (ref 12.0–15.0)
Immature Granulocytes: 0 %
Lymphocytes Relative: 85 %
Lymphs Abs: 17 10*3/uL — ABNORMAL HIGH (ref 0.7–4.0)
MCH: 32.2 pg (ref 26.0–34.0)
MCHC: 32.9 g/dL (ref 30.0–36.0)
MCV: 98 fL (ref 80.0–100.0)
Monocytes Absolute: 0.3 10*3/uL (ref 0.1–1.0)
Monocytes Relative: 1 %
Neutro Abs: 2.8 10*3/uL (ref 1.7–7.7)
Neutrophils Relative %: 14 %
Platelets: 152 10*3/uL (ref 150–400)
RBC: 3.04 MIL/uL — ABNORMAL LOW (ref 3.87–5.11)
RDW: 15.6 % — ABNORMAL HIGH (ref 11.5–15.5)
WBC: 20.2 10*3/uL — ABNORMAL HIGH (ref 4.0–10.5)
nRBC: 0 % (ref 0.0–0.2)

## 2019-07-30 ENCOUNTER — Ambulatory Visit: Payer: Medicare Other | Admitting: Hematology and Oncology

## 2019-07-30 ENCOUNTER — Other Ambulatory Visit: Payer: Medicare Other

## 2019-07-31 ENCOUNTER — Other Ambulatory Visit: Payer: Medicare Other

## 2019-07-31 ENCOUNTER — Ambulatory Visit: Payer: Medicare Other | Admitting: Hematology and Oncology

## 2019-08-11 ENCOUNTER — Encounter: Payer: Self-pay | Admitting: Hematology and Oncology

## 2019-08-11 NOTE — Progress Notes (Signed)
No new changes noted. The patient Name and DOB has been verified by phone. 

## 2019-08-12 NOTE — Progress Notes (Signed)
[facility]  [address], [address] [place], [place] [address] Phone: [phone]  Fax: [phone]   Clinic Day:  08/13/2019  Referring physician: Wenda Low, MD  Chief Complaint: ROYE [name] is a 73 y.o. female with chronic lymphocytic leukemia (CLL) who is seen for 6 month follow up   HPI: The patient was last seen in the medical oncology clinic on 01/29/2019 by Honor Loh, NP. At that time, patient was doing well overall.  She denied any acute concerns.  She denied any B symptoms or recent infections.  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    At last visit, she had fully recovered from her knee surgery, and had completed physical therapy.  She denied any recurrent knee pain.  Exam was grossly unchanged from previous.  WBC 26,700 (ANC 3600, ALC 22,000).  Hemoglobin 11.0, hematocrit 34.5, and platelets 189,000.  BUN 31 and creatinine 1.37 mg/dL.  Ferritin 43 ng/mL.  CBCs: 04/30/2019:  Hematocrit 29.8, hemoglobin 9.8, MCV 98.0, platelets 152,000, WBC 20,200 (ANC 2,800; ALC 17,000).  08/13/2019:  Hematocrit 33.3, hemoglobin 10.8, MCV 96.8, platelets 164,000, WBC 26,900 (ANC 3,100; ALC 23,400).    During the interim, she has felt "pretty good". She has not been active, but she has been trying to maintain a healthier diet. She includes meat 4 times a week into her diet and eats dark leafy greens.  Last colonoscopy in 2019 was negative.  Discussed avoiding crowds, wearing a face mask while in public, practicing stringent handwashing.   Past Medical History:  Diagnosis Date  . Allergic rhinitis   . Chronic kidney disease   . Chronic lymphocytic leukemia ([place])   . DA (degenerative arthritis)   . Diabetes mellitus   . Headache   . Heart murmur   . History of kidney stones   . Hyperlipidemia   . Hypertension   . Hypertriglyceridemia   . Iron deficiency anemia   .  Other elevated white blood cell count   . Unspecified deficiency anemia     Past Surgical History:  Procedure Laterality Date  . ABDOMINAL HYSTERECTOMY     50 years  . COLONOSCOPY  2008   most recent  . [place]   lask  . JOINT REPLACEMENT    . JOINT REPLACEMENT    . KNEE ARTHROTOMY Left 05/15/2018   Procedure: KNEE ARTHROTOMY,LYSIS OF ADHESIONS, POLY EXCHANGE;  Surgeon: Dereck Leep, MD;  Location: [facility] ORS;  Service: Orthopedics;  Laterality: Left;  . OOPHORECTOMY    . REFRACTIVE SURGERY Bilateral 1996  . REPLACEMENT TOTAL KNEE Right 2015  . REPLACEMENT TOTAL KNEE Left 2007    Family History  Problem Relation Age of Onset  . Cancer Father        gastric cancer  . Cancer Sister        gastric cancer  . Diabetes Mother   . Hypertension Mother   . Diabetes Brother   . Diabetes Brother   . Diabetes Sister   . Breast cancer Neg Hx     Social History:  reports that she has never smoked. She has never used smokeless tobacco. She reports that she does not drink alcohol or use drugs. She lives in [place]. The patient is alone today.  Allergies:  Allergies  Allergen Reactions  . Latex Rash  . Sulfa Antibiotics Rash    Current Medications: Current Outpatient Medications  Medication Sig Dispense Refill  . amLODipine (NORVASC) 5 MG tablet Take 5 mg by mouth every evening.   11  . aspirin EC 81 MG tablet Take 81 mg by mouth daily.    . benazepril-hydrochlorthiazide (LOTENSIN HCT) 20-12.5 MG per tablet Take 1 tablet by mouth daily.     . Cholecalciferol 5000 units capsule Take 5,000 Units by mouth daily.     . ferrous sulfate 325 (65 FE) MG tablet Take 325 mg by mouth daily with breakfast.    . insulin degludec (TRESIBA FLEXTOUCH) 100 UNIT/ML SOPN FlexTouch Pen Inject 15 Units into the skin daily.     . Multiple Vitamins-Minerals (CEROVITE SENIOR) TABS Take 1 tablet by mouth daily.    . pioglitazone (ACTOS) 45 MG tablet Take 45 mg by mouth daily.     .  simvastatin (ZOCOR) 10 MG tablet Take 10 mg by mouth at bedtime.   3  . sitaGLIPtin (JANUVIA) 50 MG tablet Take 50 mg by mouth daily.     Marland Kitchen acetaminophen (TYLENOL) 500 MG tablet Take 1,000 mg by mouth every 6 (six) hours as needed for moderate pain or headache.    . albuterol (PROVENTIL HFA;VENTOLIN HFA) 108 (90 Base) MCG/ACT inhaler TAKE 2 PUFFS BY MOUTH EVERY 4 HOURS AS NEEDED FOR COUGHING OR WHEEZING    . amoxicillin (AMOXIL) 500 MG capsule TAKE 4 CAPSULES BY MOUTH 1 HOUR PRIOR TO DENTAL APPOINTMENT  0  . meclizine (ANTIVERT) 25 MG tablet Take 25 mg by mouth 3 (three) times daily as needed for dizziness.     No current facility-administered medications for this visit.     Review of Systems  Constitutional: Negative.  Negative for chills, diaphoresis, fever, malaise/fatigue and weight loss (up 10 pounds).       "I feel pretty good".  HENT: Negative.  Negative for congestion, ear pain, hearing loss, nosebleeds and sinus pain.   Eyes: Negative for double vision, photophobia and pain.       CRAO and retinal tear - followed by ophthalmology.  Respiratory: Negative.  Negative for cough, hemoptysis, sputum production and shortness of breath.   Cardiovascular: Negative.  Negative for chest pain, palpitations, orthopnea, leg swelling and PND.       EF 60-65%.  Gastrointestinal: Negative.  Negative for abdominal pain, blood in stool, constipation, diarrhea, melena, nausea and vomiting.  Genitourinary: Negative.  Negative for dysuria, frequency and hematuria.  Musculoskeletal: Negative.  Negative for back pain, falls, joint pain (knee pain improved s/p surgery), myalgias and neck pain.  Skin: Negative.  Negative for itching and rash.  Neurological: Negative.  Negative for dizziness, tingling, tremors, sensory change, focal weakness, weakness and headaches.  Endo/Heme/Allergies: Negative.  Does not bruise/bleed easily.  Psychiatric/Behavioral: Negative.  Negative for depression and memory loss. The  patient is not nervous/anxious and does not have insomnia.   All other systems reviewed and are negative.  Performance status (ECOG):  0  Vitals Blood pressure (!) 148/60, pulse 76, temperature 97.9 F (36.6 C), temperature source Tympanic, resp. rate 18, height 5\' 10"  (1.778 m), weight 282 lb 4.8 oz (128 kg), SpO2 100 %.   Physical Exam  Constitutional: She is oriented to person, place, and time. She appears well-developed and well-nourished.  HENT:  Head: Normocephalic and atraumatic.  Mouth/Throat: Oropharynx is clear and moist. No oropharyngeal exudate.  Short dark hair.  Mask.  Eyes: Pupils are equal, round, and reactive to light. EOM are normal.  Neck: Normal range of motion. Neck supple. No  JVD present.  Cardiovascular: Normal rate and regular rhythm. Exam reveals no gallop and no friction rub.  No murmur heard. Pulmonary/Chest: Effort normal and breath sounds normal. She has no wheezes. She has no rales. She exhibits no tenderness.  Abdominal: Soft. Bowel sounds are normal. She exhibits no mass. There is no abdominal tenderness. There is no rebound and no guarding.  Musculoskeletal: Normal range of motion.        General: No tenderness or edema.  Lymphadenopathy:    She has no cervical adenopathy.  Neurological: She is alert and oriented to person, place, and time.  Skin: Skin is warm and dry. No rash noted. No erythema. No pallor.  Psychiatric: She has a normal mood and affect. Her behavior is normal. Judgment and thought content normal.  Nursing note and vitals reviewed.   Orders Only on 08/13/2019  Component Date Value Ref Range Status  . Haptoglobin 08/13/2019 166  42 - 346 mg/dL Final   Comment: (NOTE) Performed At: [facility] [place], [place] [id number] Rush Farmer MD [id number]   . Retic Ct Pct 08/13/2019 1.4  0.4 - 3.1 % Final  . RBC. 08/13/2019 3.52* 3.87 - 5.11 MIL/uL Final  . Retic Count, Absolute 08/13/2019 47.9  19.0 - 186.0  K/uL Final  . Immature Retic Fract 08/13/2019 14.6  2.3 - 15.9 % Final   Performed at [facility], [address], [place], [place] [address]  . TSH 08/13/2019 2.905  0.350 - 4.500 uIU/mL Final   Comment: Performed by a 3rd Generation assay with a functional sensitivity of <=0.01 uIU/mL. Performed at [facility] [address], [place], [place] [address]   . Folate 08/13/2019 40.0  >5.9 ng/mL Final   Performed at [facility], [place] [address] [place],  [place] [address]  . Vitamin B-12 08/13/2019 991* 180 - 914 pg/mL Final   Comment: (NOTE) This assay is not validated for testing neonatal or myeloproliferative syndrome specimens for Vitamin B12 levels. Performed at [facility], [place] [address] [place], [place] [address]   Appointment on 08/13/2019  Component Date Value Ref Range Status  . Ferritin 08/13/2019 42  11 - 307 ng/mL Final   Performed at [facility], [place] [place]  [address]  . Sodium 08/13/2019 140  135 - 145 mmol/L Final  . Potassium 08/13/2019 4.4  3.5 - 5.1 mmol/L Final  . Chloride 08/13/2019 105  98 - 111 mmol/L Final  . CO2 08/13/2019 25  22 - 32 mmol/L Final  . Glucose, Bld 08/13/2019 148* 70 - 99 mg/dL Final  . BUN 08/13/2019 35* 8 - 23 mg/dL Final  . Creatinine, Ser 08/13/2019 1.49* 0.44 - 1.00 mg/dL Final  . Calcium 08/13/2019 8.8* 8.9 - 10.3 mg/dL Final  . Total Protein 08/13/2019 6.9  6.5 - 8.1 g/dL Final  . Albumin 08/13/2019 3.9  3.5 - 5.0 g/dL Final  . AST 08/13/2019 19  15 - 41 U/L Final  . ALT 08/13/2019 17  0 - 44 U/L Final  . Alkaline Phosphatase 08/13/2019 59  38 - 126 U/L Final  . Total Bilirubin 08/13/2019 0.5  0.3 - 1.2 mg/dL Final  . GFR calc non Af Amer 08/13/2019 35* >60 mL/min Final  . GFR calc Af Amer 08/13/2019 40* >60 mL/min Final  . Anion gap 08/13/2019 10  5 - 15 Final   Performed at [facility], [address] [place], [place] [address]  . WBC  08/13/2019  26.9* 4.0 - 10.5 K/uL Final  . RBC 08/13/2019 3.44* 3.87 - 5.11 MIL/uL Final  . Hemoglobin 08/13/2019 10.8* 12.0 - 15.0 g/dL Final  . HCT 08/13/2019 33.3* 36.0 - 46.0 % Final  . MCV 08/13/2019 96.8  80.0 - 100.0 fL Final  . MCH 08/13/2019 31.4  26.0 - 34.0 pg Final  . MCHC 08/13/2019 32.4  30.0 - 36.0 g/dL Final  . RDW 08/13/2019 15.6* 11.5 - 15.5 % Final  . Platelets 08/13/2019 164  150 - 400 K/uL Final  . nRBC 08/13/2019 0.0  0.0 - 0.2 % Final  . Neutrophils Relative % 08/13/2019 12  % Final  . Neutro Abs 08/13/2019 3.1  1.7 - 7.7 K/uL Final  . Lymphocytes Relative 08/13/2019 86  % Final  . Lymphs Abs 08/13/2019 23.4* 0.7 - 4.0 K/uL Final  . Monocytes Relative 08/13/2019 1  % Final  . Monocytes Absolute 08/13/2019 0.4  0.1 - 1.0 K/uL Final  . Eosinophils Relative 08/13/2019 1  % Final  . Eosinophils Absolute 08/13/2019 0.2  0.0 - 0.5 K/uL Final  . Basophils Relative 08/13/2019 0  % Final  . Basophils Absolute 08/13/2019 0.1  0.0 - 0.1 K/uL Final  . Immature Granulocytes 08/13/2019 0  % Final  . Abs Immature Granulocytes 08/13/2019 0.04  0.00 - 0.07 K/uL Final   Performed at [facility], [address] [place], [place] [address]    Assessment:  Tammy [name] is a 73 y.o. female with stage 0 CLL diagnosed in 07/2009.  She presented with lymphocytosis.  WBC has ranged between 13,600 -. 32,500 without a trend.  Platelet count is normal.  She has had no issues with bruising or bleeding.    She has a history of iron deficiency anemia.  Diet is good (she eats meat 4 times/week).  She is on oral iron (ferrous sulfate) every other day.  She denies any bleeding.  Colonoscopy is due in 2018.  Guaiac cards were - x 3 in 05/2015.  She has chronic kidney disease (CrCl 46 ml/min).  B12 and folate were normal on 06/14/2016.  Mammogram on 10/04/2016 was negative.  Colonoscopy on 07/12/2017 revealed three 4-6 mm polyps.  Pathology revealed tubular adenomas in the ascending colon and  rectum.  The cecum polyp was polypoid colonic mucosa negative for dysplasia.  Repeat colonoscopy is planned in 5 years.  Echocardiogram on 10/16/2018 demonstrated an LVEF of 60-65%. Severe focal basal hypertrophy of the septum noted, which is consistent with hypertrophic cardiomyopathy.   BILATERAL carotid duplex on 10/16/2018 demonstrated minimal heterogenous and calcified plaque, with no significant stenosis.   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  Presented with painless loss of vision in her RIGHT eye.  CT head was negative with no acute intracranial abnormality noted. MRI of the brain was negative with no acute intracranial abnormality.    Symptomatically, she is doing well.  She denies any B symptoms.  Exam reveals no adenopathy or hepatosplenomegaly.  WBC is 26,900 (ANC 3100; ALC 23,400).  Plan: 1.   Labs today: CBC with diff, BMP, ferritin. 2.   Chronic lymphocytic leukemia (CLL)  Clinically, she is doing well.  Hematocrit 33.3.  Hemoglobin 10.8.  MCV 96.8.  Platelets 164,000.  WBC 26,900.  Exam reveals no adenopathy or hepatosplenomegaly.  Continue surveillance. 3.   Normocytic anemia Etiology unclear but possibly related to CLL or chronic kidney disease. Patient notes a normal diet. She denies any  melena, hematochezia, hematuria or vaginal bleeding. Colonoscopy in 2019 was negative.  Additional labs today: retic, B12, folate, TSH, haptoglobin. 4.   Chronic kidney disease  Creatinine 1.49 (CrCl 49.7 ml/min).  Patient followed by nephrology. 5.   RTC in 3 months for labs (CBC with diff, BMP). 6.   RTC in 6 months for MD assessment and labs (CBC with diff, BMP, ferritin, and +/- others).   I discussed the assessment and treatment plan with the patient.  The patient was provided an opportunity to ask questions and all were answered.  The patient agreed with the plan and demonstrated an understanding of the instructions.  The  patient was advised to call back if the symptoms worsen or if the condition fails to improve as anticipated.   Lequita Asal, MD, PhD    08/13/2019, 3:35 PM  I, Jacqualyn Posey, am acting as Education administrator for [employer]. Mike Gip, MD, PhD.  I,  C. Mike Gip, MD, have reviewed the above documentation for accuracy and completeness, and I agree with the above.

## 2019-08-13 ENCOUNTER — Other Ambulatory Visit: Payer: Self-pay

## 2019-08-13 ENCOUNTER — Inpatient Hospital Stay: Payer: Medicare Other | Attending: Hematology and Oncology

## 2019-08-13 ENCOUNTER — Inpatient Hospital Stay (HOSPITAL_BASED_OUTPATIENT_CLINIC_OR_DEPARTMENT_OTHER): Payer: Medicare Other | Admitting: Hematology and Oncology

## 2019-08-13 ENCOUNTER — Encounter: Payer: Self-pay | Admitting: Hematology and Oncology

## 2019-08-13 VITALS — BP 148/60 | HR 76 | Temp 97.9°F | Resp 18 | Ht 70.0 in | Wt 282.3 lb

## 2019-08-13 DIAGNOSIS — D649 Anemia, unspecified: Secondary | ICD-10-CM

## 2019-08-13 DIAGNOSIS — I129 Hypertensive chronic kidney disease with stage 1 through stage 4 chronic kidney disease, or unspecified chronic kidney disease: Secondary | ICD-10-CM | POA: Diagnosis not present

## 2019-08-13 DIAGNOSIS — D509 Iron deficiency anemia, unspecified: Secondary | ICD-10-CM

## 2019-08-13 DIAGNOSIS — E785 Hyperlipidemia, unspecified: Secondary | ICD-10-CM | POA: Insufficient documentation

## 2019-08-13 DIAGNOSIS — Z79899 Other long term (current) drug therapy: Secondary | ICD-10-CM | POA: Diagnosis not present

## 2019-08-13 DIAGNOSIS — C9110 Chronic lymphocytic leukemia of B-cell type not having achieved remission: Secondary | ICD-10-CM

## 2019-08-13 DIAGNOSIS — N189 Chronic kidney disease, unspecified: Secondary | ICD-10-CM | POA: Diagnosis not present

## 2019-08-13 DIAGNOSIS — Z794 Long term (current) use of insulin: Secondary | ICD-10-CM | POA: Diagnosis not present

## 2019-08-13 DIAGNOSIS — E781 Pure hyperglyceridemia: Secondary | ICD-10-CM | POA: Insufficient documentation

## 2019-08-13 DIAGNOSIS — Z7982 Long term (current) use of aspirin: Secondary | ICD-10-CM | POA: Insufficient documentation

## 2019-08-13 DIAGNOSIS — E1122 Type 2 diabetes mellitus with diabetic chronic kidney disease: Secondary | ICD-10-CM | POA: Insufficient documentation

## 2019-08-13 LAB — COMPREHENSIVE METABOLIC PANEL
ALT: 17 U/L (ref 0–44)
AST: 19 U/L (ref 15–41)
Albumin: 3.9 g/dL (ref 3.5–5.0)
Alkaline Phosphatase: 59 U/L (ref 38–126)
Anion gap: 10 (ref 5–15)
BUN: 35 mg/dL — ABNORMAL HIGH (ref 8–23)
CO2: 25 mmol/L (ref 22–32)
Calcium: 8.8 mg/dL — ABNORMAL LOW (ref 8.9–10.3)
Chloride: 105 mmol/L (ref 98–111)
Creatinine, Ser: 1.49 mg/dL — ABNORMAL HIGH (ref 0.44–1.00)
GFR calc Af Amer: 40 mL/min — ABNORMAL LOW (ref >60)
GFR calc non Af Amer: 35 mL/min — ABNORMAL LOW (ref >60)
Glucose, Bld: 148 mg/dL — ABNORMAL HIGH (ref 70–99)
Potassium: 4.4 mmol/L (ref 3.5–5.1)
Sodium: 140 mmol/L (ref 135–145)
Total Bilirubin: 0.5 mg/dL (ref 0.3–1.2)
Total Protein: 6.9 g/dL (ref 6.5–8.1)

## 2019-08-13 LAB — CBC WITH DIFFERENTIAL/PLATELET
Abs Immature Granulocytes: 0.04 10*3/uL (ref 0.00–0.07)
Basophils Absolute: 0.1 10*3/uL (ref 0.0–0.1)
Basophils Relative: 0 %
Eosinophils Absolute: 0.2 10*3/uL (ref 0.0–0.5)
Eosinophils Relative: 1 %
HCT: 33.3 % — ABNORMAL LOW (ref 36.0–46.0)
Hemoglobin: 10.8 g/dL — ABNORMAL LOW (ref 12.0–15.0)
Immature Granulocytes: 0 %
Lymphocytes Relative: 86 %
Lymphs Abs: 23.4 10*3/uL — ABNORMAL HIGH (ref 0.7–4.0)
MCH: 31.4 pg (ref 26.0–34.0)
MCHC: 32.4 g/dL (ref 30.0–36.0)
MCV: 96.8 fL (ref 80.0–100.0)
Monocytes Absolute: 0.4 10*3/uL (ref 0.1–1.0)
Monocytes Relative: 1 %
Neutro Abs: 3.1 10*3/uL (ref 1.7–7.7)
Neutrophils Relative %: 12 %
Platelets: 164 10*3/uL (ref 150–400)
RBC: 3.44 MIL/uL — ABNORMAL LOW (ref 3.87–5.11)
RDW: 15.6 % — ABNORMAL HIGH (ref 11.5–15.5)
WBC: 26.9 10*3/uL — ABNORMAL HIGH (ref 4.0–10.5)
nRBC: 0 % (ref 0.0–0.2)

## 2019-08-13 LAB — FERRITIN: Ferritin: 42 ng/mL (ref 11–307)

## 2019-08-13 LAB — VITAMIN B12: Vitamin B-12: 991 pg/mL — ABNORMAL HIGH (ref 180–914)

## 2019-08-13 LAB — RETICULOCYTES
Immature Retic Fract: 14.6 % (ref 2.3–15.9)
RBC.: 3.52 MIL/uL — ABNORMAL LOW (ref 3.87–5.11)
Retic Count, Absolute: 47.9 10*3/uL (ref 19.0–186.0)
Retic Ct Pct: 1.4 % (ref 0.4–3.1)

## 2019-08-13 LAB — TSH: TSH: 2.905 u[IU]/mL (ref 0.350–4.500)

## 2019-08-13 LAB — FOLATE: Folate: 40 ng/mL (ref >5.9)

## 2019-08-14 LAB — HAPTOGLOBIN: Haptoglobin: 166 mg/dL (ref 42–346)

## 2019-09-16 ENCOUNTER — Other Ambulatory Visit: Payer: Self-pay | Admitting: Internal Medicine

## 2019-09-16 DIAGNOSIS — Z1231 Encounter for screening mammogram for malignant neoplasm of breast: Secondary | ICD-10-CM

## 2019-10-20 ENCOUNTER — Ambulatory Visit
Admission: RE | Admit: 2019-10-20 | Discharge: 2019-10-20 | Disposition: A | Discharge status: Home / Self Care | Payer: Medicare Other | Source: Ambulatory Visit | Attending: Internal Medicine | Admitting: Internal Medicine

## 2019-10-20 ENCOUNTER — Other Ambulatory Visit: Payer: Self-pay

## 2019-10-20 DIAGNOSIS — Z1231 Encounter for screening mammogram for malignant neoplasm of breast: Secondary | ICD-10-CM | POA: Diagnosis not present

## 2019-11-12 ENCOUNTER — Other Ambulatory Visit: Payer: Self-pay

## 2019-11-12 ENCOUNTER — Inpatient Hospital Stay: Payer: Medicare Other | Attending: Hematology and Oncology

## 2019-11-12 DIAGNOSIS — C9110 Chronic lymphocytic leukemia of B-cell type not having achieved remission: Secondary | ICD-10-CM

## 2019-11-12 DIAGNOSIS — D509 Iron deficiency anemia, unspecified: Secondary | ICD-10-CM | POA: Insufficient documentation

## 2019-11-12 DIAGNOSIS — D649 Anemia, unspecified: Secondary | ICD-10-CM

## 2019-11-12 LAB — CBC WITH DIFFERENTIAL/PLATELET
Abs Immature Granulocytes: 0.08 10*3/uL — ABNORMAL HIGH (ref 0.00–0.07)
Basophils Absolute: 0.1 10*3/uL (ref 0.0–0.1)
Basophils Relative: 0 %
Eosinophils Absolute: 0.1 10*3/uL (ref 0.0–0.5)
Eosinophils Relative: 0 %
HCT: 35.6 % — ABNORMAL LOW (ref 36.0–46.0)
Hemoglobin: 11.5 g/dL — ABNORMAL LOW (ref 12.0–15.0)
Immature Granulocytes: 0 %
Lymphocytes Relative: 84 %
Lymphs Abs: 26.7 10*3/uL — ABNORMAL HIGH (ref 0.7–4.0)
MCH: 31.2 pg (ref 26.0–34.0)
MCHC: 32.3 g/dL (ref 30.0–36.0)
MCV: 96.5 fL (ref 80.0–100.0)
Monocytes Absolute: 0.6 10*3/uL (ref 0.1–1.0)
Monocytes Relative: 2 %
Neutro Abs: 4.4 10*3/uL (ref 1.7–7.7)
Neutrophils Relative %: 14 %
Platelets: 191 10*3/uL (ref 150–400)
RBC: 3.69 MIL/uL — ABNORMAL LOW (ref 3.87–5.11)
RDW: 14.1 % (ref 11.5–15.5)
Smear Review: NORMAL
WBC: 32 10*3/uL — ABNORMAL HIGH (ref 4.0–10.5)
nRBC: 0 % (ref 0.0–0.2)

## 2019-11-12 LAB — BASIC METABOLIC PANEL WITH GFR
Anion gap: 10 (ref 5–15)
BUN: 20 mg/dL (ref 8–23)
CO2: 28 mmol/L (ref 22–32)
Calcium: 9.2 mg/dL (ref 8.9–10.3)
Chloride: 101 mmol/L (ref 98–111)
Creatinine, Ser: 1.3 mg/dL — ABNORMAL HIGH (ref 0.44–1.00)
GFR calc Af Amer: 47 mL/min — ABNORMAL LOW
GFR calc non Af Amer: 41 mL/min — ABNORMAL LOW
Glucose, Bld: 177 mg/dL — ABNORMAL HIGH (ref 70–99)
Potassium: 3.7 mmol/L (ref 3.5–5.1)
Sodium: 139 mmol/L (ref 135–145)

## 2020-02-06 ENCOUNTER — Other Ambulatory Visit: Payer: Self-pay

## 2020-02-06 DIAGNOSIS — C9110 Chronic lymphocytic leukemia of B-cell type not having achieved remission: Secondary | ICD-10-CM

## 2020-02-08 NOTE — Progress Notes (Signed)
[facility]  [address] [place], [place] [address] Phone: [phone]  Fax: [phone]   Clinic Day:  02/10/2020  Referring physician: Wenda Low, MD  Chief Complaint: Tammy [name] is a 74 y.o. female with chronic lymphocytic leukemia (CLL) who is seen for 6 month assessment.  HPI: The patient was last seen in the medical oncology clinic on 08/13/2019. At that time, she was doing well. She denied any B symptoms. Exam revealed no adenopathy or hepatosplenomegaly.  Last CBC had noted a hemoglobin hemoglobin of 9.8.  Labs on 08/13/2019 included a hematocrit 33.3, hemoglobin 10.8, MCV 96.8, platelets 164,000, WBC 26,900 (ANC 3100; ALC 23,400).  Retic was 1.4%. Creatinine was 1.49.  Ferritin was 42.  Haptoglobin was 166.  TSH 2.905. Vitamin B12 was 991 and folate 40.0.   Patient was seen in ophthalmology by Dr. Gilman Schmidt on 09/24/2019. Patient's vision was stable. She noted diffuse retinal thinning. Her inner retinal hyperreflectivity and thickening resolved. Patient will follow up in 6 months.   Bilateral mammogram on 10/20/2019 revealed no mammographic evidence of malignancy.   Labs on 11/12/2019: Hematocrit 35.6, hemoglobin 11.5, MCV 96.5, platelets 191,000, WBC 32,000 (ANC 4,400; ALC 26,700).   During the interim, the patient has felt "good". She denies any B symptoms. She has no bruising or bleeding. No infections. She notes that if she closes her left eye she is able to see the top of my head and the bottom half of me. Patient feels like her vision is slightly better. Her knee pain is much better.   Patient has lost 25 lbs since 08/13/2019. She notes being on Trulicity. She is eating better. She has no goal weight. Patient had her last COVID-19 vaccine in 11/2019.    Past Medical History:  Diagnosis Date  . Allergic rhinitis   . Chronic kidney disease   . Chronic lymphocytic leukemia ([place])   . DA (degenerative arthritis)   . Diabetes  mellitus   . Headache   . Heart murmur   . History of kidney stones   . Hyperlipidemia   . Hypertension   . Hypertriglyceridemia   . Iron deficiency anemia   . Other elevated white blood cell count   . Unspecified deficiency anemia     Past Surgical History:  Procedure Laterality Date  . ABDOMINAL HYSTERECTOMY     50 years  . COLONOSCOPY  2008   most recent  . Gem   lask  . JOINT REPLACEMENT    . JOINT REPLACEMENT    . KNEE ARTHROTOMY Left 05/15/2018   Procedure: KNEE ARTHROTOMY,LYSIS OF ADHESIONS, POLY EXCHANGE;  Surgeon: Dereck Leep, MD;  Location: [facility] ORS;  Service: Orthopedics;  Laterality: Left;  . OOPHORECTOMY    . REFRACTIVE SURGERY Bilateral 1996  . REPLACEMENT TOTAL KNEE Right 2015  . REPLACEMENT TOTAL KNEE Left 2007    Family History  Problem Relation Age of Onset  . Cancer Father        gastric cancer  . Cancer Sister        gastric cancer  . Diabetes Mother   . Hypertension Mother   . Diabetes Brother   . Diabetes Brother   . Diabetes Sister   . Breast cancer Neg Hx     Social History:  reports that she has never smoked. She has never used smokeless tobacco. She reports that she does not drink alcohol or use drugs. She lives in [place]. The patient is alone  today.  Allergies:  Allergies  Allergen Reactions  . Latex Rash  . Sulfa Antibiotics Rash    Current Medications: Current Outpatient Medications  Medication Sig Dispense Refill  . ACCU-CHEK GUIDE test strip USE AS DIRECTED 3 TO 4 TIMES PER DAY    . acetaminophen (TYLENOL) 500 MG tablet Take 1,000 mg by mouth every 6 (six) hours as needed for moderate pain or headache.    Marland Kitchen acyclovir (ZOVIRAX) 400 MG tablet Take 400 mg by mouth 3 (three) times daily as needed.    Marland Kitchen albuterol (PROVENTIL HFA;VENTOLIN HFA) 108 (90 Base) MCG/ACT inhaler TAKE 2 PUFFS BY MOUTH EVERY 4 HOURS AS NEEDED FOR COUGHING OR WHEEZING    . amLODipine (NORVASC) 5 MG tablet Take 5 mg by mouth every evening.    11  . amoxicillin (AMOXIL) 500 MG capsule TAKE 4 CAPSULES BY MOUTH 1 HOUR PRIOR TO DENTAL APPOINTMENT  0  . aspirin EC 81 MG tablet Take 81 mg by mouth daily.    . benazepril-hydrochlorthiazide (LOTENSIN HCT) 20-12.5 MG per tablet Take 1 tablet by mouth daily.     . Cholecalciferol 5000 units capsule Take 5,000 Units by mouth daily.     . ferrous sulfate 325 (65 FE) MG tablet Take 325 mg by mouth daily with breakfast.    . fluticasone (FLONASE) 50 MCG/ACT nasal spray Place 2 sprays into both nostrils daily.    Marland Kitchen glimepiride (AMARYL) 2 MG tablet Take 2 mg by mouth every morning.    . insulin degludec (TRESIBA FLEXTOUCH) 100 UNIT/ML SOPN FlexTouch Pen Inject 15 Units into the skin daily.     . meclizine (ANTIVERT) 25 MG tablet Take 25 mg by mouth 3 (three) times daily as needed for dizziness.    . Multiple Vitamins-Minerals (CEROVITE SENIOR) TABS Take 1 tablet by mouth daily.    . simvastatin (ZOCOR) 10 MG tablet Take 10 mg by mouth at bedtime.   3  . sitaGLIPtin (JANUVIA) 50 MG tablet Take 50 mg by mouth daily.     . TRULICITY 1.5 0000000 SOPN SMARTSIG:1 Pre-Filled Pen Syringe SUB-Q Once a Week     No current facility-administered medications for this visit.    Review of Systems  Constitutional: Positive for weight loss (25 pounds since 08/13/2019; intentional; no goal weight). Negative for chills, diaphoresis, fever and malaise/fatigue.       Feels "good".  HENT: Negative.  Negative for congestion, ear pain, hearing loss, nosebleeds and sinus pain.   Eyes: Negative for double vision, photophobia and pain.       CRAO and retinal tear - followed by ophthalmology. Vision slightly better.  Respiratory: Negative.  Negative for cough, hemoptysis, sputum production and shortness of breath.   Cardiovascular: Negative.  Negative for chest pain, palpitations, orthopnea, leg swelling and PND.  Gastrointestinal: Negative.  Negative for abdominal pain, blood in stool, constipation, diarrhea, melena,  nausea and vomiting.       Eating better.  Genitourinary: Negative.  Negative for dysuria, frequency and hematuria.  Musculoskeletal: Negative.  Negative for back pain, falls, joint pain (knee pain; better), myalgias and neck pain.  Skin: Negative.  Negative for itching and rash.  Neurological: Negative.  Negative for dizziness, tingling, tremors, sensory change, focal weakness, weakness and headaches.  Endo/Heme/Allergies: Negative.  Does not bruise/bleed easily.  Psychiatric/Behavioral: Negative.  Negative for depression and memory loss. The patient is not nervous/anxious and does not have insomnia.   All other systems reviewed and are negative.  Performance status (ECOG): 0  Vitals Blood pressure (!) 150/69, pulse 76, temperature (!) 95 F (35 C), resp. rate 18, weight 257 lb 15 oz (117 kg), SpO2 99 %.   Physical Exam Vitals and nursing note reviewed.  Constitutional:      Appearance: She is well-developed and well-nourished.  HENT:     Head: Normocephalic and atraumatic.     Mouth/Throat:     Mouth: Oropharynx is clear and moist.     Pharynx: No oropharyngeal exudate.      Comments: Short dark brown hair.  Mask. Eyes:     Extraocular Movements: EOM normal.     Pupils: Pupils are equal, round, and reactive to light.  Neck:     Vascular: No JVD.  Cardiovascular:     Rate and Rhythm: Normal rate and regular rhythm.     Heart sounds: No murmur heard.  No friction rub. No gallop.   Pulmonary:     Effort: Pulmonary effort is normal.     Breath sounds: Normal breath sounds. No wheezing or rales.  Chest:     Chest wall: No tenderness.  Abdominal:     General: Bowel sounds are normal.     Palpations: Abdomen is soft. There is no mass.     Tenderness: There is no abdominal tenderness. There is no guarding or rebound.  Musculoskeletal:        General: No tenderness or edema. Normal range of motion.     Cervical back: Normal range of motion and neck supple.  Lymphadenopathy:       Head:     Right side of head: No preauricular, posterior auricular or occipital adenopathy.     Left side of head: No preauricular, posterior auricular or occipital adenopathy.     Cervical: No cervical adenopathy.     Upper Body:  No axillary adenopathy present.    Right upper body: No supraclavicular adenopathy.     Left upper body: No supraclavicular adenopathy.     Lower Body: No right inguinal adenopathy. No left inguinal adenopathy.  Skin:    General: Skin is warm and dry.     Coloration: Skin is not pale.     Findings: No erythema or rash.  Neurological:     Mental Status: She is alert and oriented to person, place, and time.  Psychiatric:        Mood and Affect: Mood and affect normal.        Behavior: Behavior normal.        Thought Content: Thought content normal.        Judgment: Judgment normal.    Appointment on 02/10/2020  Component Date Value Ref Range Status  . WBC 02/10/2020 34.7* 4.0 - 10.5 K/uL Final  . RBC 02/10/2020 3.69* 3.87 - 5.11 MIL/uL Final  . Hemoglobin 02/10/2020 11.3* 12.0 - 15.0 g/dL Final  . HCT 02/10/2020 36.0  36.0 - 46.0 % Final  . MCV 02/10/2020 97.6  80.0 - 100.0 fL Final  . MCH 02/10/2020 30.6  26.0 - 34.0 pg Final  . MCHC 02/10/2020 31.4  30.0 - 36.0 g/dL Final  . RDW 02/10/2020 14.2  11.5 - 15.5 % Final  . Platelets 02/10/2020 252  150 - 400 K/uL Final  . nRBC 02/10/2020 0.0  0.0 - 0.2 % Final   Performed at [facility], [address] [place], [place] [address]  . Neutrophils Relative % 02/10/2020 PENDING  % Incomplete  . Neutro Abs 02/10/2020 PENDING  1.7 - 7.7 K/uL  Incomplete  . Band Neutrophils 02/10/2020 PENDING  % Incomplete  . Lymphocytes Relative 02/10/2020 PENDING  % Incomplete  . Lymphs Abs 02/10/2020 PENDING  0.7 - 4.0 K/uL Incomplete  . Monocytes Relative 02/10/2020 PENDING  % Incomplete  . Monocytes Absolute 02/10/2020 PENDING  0.1 - 1.0 K/uL Incomplete  . Eosinophils Relative 02/10/2020 PENDING  %  Incomplete  . Eosinophils Absolute 02/10/2020 PENDING  0.0 - 0.5 K/uL Incomplete  . Basophils Relative 02/10/2020 PENDING  % Incomplete  . Basophils Absolute 02/10/2020 PENDING  0.0 - 0.1 K/uL Incomplete  . WBC Morphology 02/10/2020 PENDING   Incomplete  . RBC Morphology 02/10/2020 PENDING   Incomplete  . Smear Review 02/10/2020 PENDING   Incomplete  . Other 02/10/2020 PENDING  % Incomplete  . nRBC 02/10/2020 PENDING  0 /100 WBC Incomplete  . Metamyelocytes Relative 02/10/2020 PENDING  % Incomplete  . Myelocytes 02/10/2020 PENDING  % Incomplete  . Promyelocytes Relative 02/10/2020 PENDING  % Incomplete  . Blasts 02/10/2020 PENDING  % Incomplete  . Immature Granulocytes 02/10/2020 PENDING  % Incomplete  . Abs Immature Granulocytes 02/10/2020 PENDING  0.00 - 0.07 K/uL Incomplete  . Sodium 02/10/2020 139  135 - 145 mmol/L Final  . Potassium 02/10/2020 4.5  3.5 - 5.1 mmol/L Final  . Chloride 02/10/2020 103  98 - 111 mmol/L Final  . CO2 02/10/2020 25  22 - 32 mmol/L Final  . Glucose, Bld 02/10/2020 179* 70 - 99 mg/dL Final   Glucose reference range applies only to samples taken after fasting for at least 8 hours.  . BUN 02/10/2020 25* 8 - 23 mg/dL Final  . Creatinine, Ser 02/10/2020 1.24* 0.44 - 1.00 mg/dL Final  . Calcium 02/10/2020 8.9  8.9 - 10.3 mg/dL Final  . GFR calc non Af Amer 02/10/2020 43* >60 mL/min Final  . GFR calc Af Amer 02/10/2020 50* >60 mL/min Final  . Anion gap 02/10/2020 11  5 - 15 Final   Performed at [facility], [address], [place], [place] [address]    Assessment:  Tammy [name] is a 74 y.o. female withstage 0 CLLdiagnosed in 07/2009. She presented with lymphocytosis. WBC has ranged between 13,600 -. 32,500 without a trend. Platelet count is normal. She has had no issues with bruising or bleeding.   She has a history ofiron deficiency anemia. Diet is good (she eats meat 4 times/week). She is on oral iron (ferrous sulfate) every other  day. She denies any bleeding. Colonoscopy is due in 2018. Guaiac cards were - x 3 in 05/2015. She haschronic kidney disease(CrCl 46 ml/min). B12 and folate were normal on 06/14/2016.  Mammogramon 10/04/2016 was negative. Colonoscopyon 07/12/2017 revealed three 4-6 mm polyps. Pathology revealed tubular adenomas in the ascending colon and rectum. The cecum polyp was polypoid colonic mucosa negative for dysplasia. Repeat colonoscopy is planned in 5 years.  Echocardiogramon 10/16/2018 demonstrated an LVEF of 60-65%. Severe focal basal hypertrophy of the septum noted, which is consistent with hypertrophic cardiomyopathy.   BILATERAL carotid duplexon 10/16/2018 demonstrated minimal heterogenous and calcified plaque, with no significant stenosis.   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 Presented with painless loss of vision in her RIGHT eye. CT head was negative with no acute intracranial abnormality noted. MRI of the brain was negative with no acute intracranial abnormality.   Symptomatically, she is doing well.  She denies any B symptoms.  She has lost weight intentionally.  WBC is 34,700.  Plan: 1.  Labs today: CBC with diff, BMP, ferritin. 2.   Chronic lymphocytic leukemia (CLL)             Clinically, she is doing well..             Hematocrit 36.0.  Hemoglobin 11.3.  MCV 97.6.  Platelets 252,000.  WBC 34,700.             Exam reveals no adenopathy or hepatosplenomegaly.             Continue surveillance. 3.   Mild normocytic anemia Hemoglobin 11.3.  Ferritin 66. Etiology unclear but possibly related to CLL or chronic kidney disease. Patient has a normal diet. She denies any melena, hematochezia, hematuria or vaginal bleeding. Colonoscopy in 2019 was negative. B12, folate, TSH, haptoglobin were normal on 08/13/2019. Continue to monitor. 4.   Chronic kidney disease             Creatinine 1.24 (improved).              Patient followed by nephrology. 5.   RTC in 3 months for labs (CBC with diff). 6.   RTC in33months forMD assessment and labs (CBC with diff, CMP, ferritin).  I discussed the assessment and treatment plan with the patient.  The patient was provided an opportunity to ask questions and all were answered.  The patient agreed with the plan and demonstrated an understanding of the instructions.  The patient was advised to call back if the symptoms worsen or if the condition fails to improve as anticipated.   Lequita Asal, MD, PhD    02/10/2020, 9:13 AM  I, Selena Batten, am acting as scribe for [employer]. Mike Gip, MD, PhD.  I, Nils Thor C. Mike Gip, MD, have reviewed the above documentation for accuracy and completeness, and I agree with the above.

## 2020-02-10 ENCOUNTER — Inpatient Hospital Stay: Payer: Medicare Other | Attending: Hematology and Oncology | Admitting: Hematology and Oncology

## 2020-02-10 ENCOUNTER — Inpatient Hospital Stay: Payer: Medicare Other

## 2020-02-10 ENCOUNTER — Encounter: Payer: Self-pay | Admitting: Hematology and Oncology

## 2020-02-10 ENCOUNTER — Other Ambulatory Visit: Payer: Self-pay

## 2020-02-10 VITALS — BP 150/69 | HR 76 | Temp 95.0°F | Resp 18 | Wt 257.9 lb

## 2020-02-10 DIAGNOSIS — D649 Anemia, unspecified: Secondary | ICD-10-CM | POA: Diagnosis not present

## 2020-02-10 DIAGNOSIS — D509 Iron deficiency anemia, unspecified: Secondary | ICD-10-CM | POA: Diagnosis not present

## 2020-02-10 DIAGNOSIS — C9110 Chronic lymphocytic leukemia of B-cell type not having achieved remission: Secondary | ICD-10-CM

## 2020-02-10 LAB — CBC WITH DIFFERENTIAL/PLATELET
Abs Immature Granulocytes: 0.08 10*3/uL — ABNORMAL HIGH (ref 0.00–0.07)
Basophils Absolute: 0.1 10*3/uL (ref 0.0–0.1)
Basophils Relative: 0 %
Eosinophils Absolute: 1.1 10*3/uL — ABNORMAL HIGH (ref 0.0–0.5)
Eosinophils Relative: 3 %
HCT: 36 % (ref 36.0–46.0)
Hemoglobin: 11.3 g/dL — ABNORMAL LOW (ref 12.0–15.0)
Immature Granulocytes: 0 %
Lymphocytes Relative: 83 %
Lymphs Abs: 28.5 10*3/uL — ABNORMAL HIGH (ref 0.7–4.0)
MCH: 30.6 pg (ref 26.0–34.0)
MCHC: 31.4 g/dL (ref 30.0–36.0)
MCV: 97.6 fL (ref 80.0–100.0)
Monocytes Absolute: 0.1 10*3/uL (ref 0.1–1.0)
Monocytes Relative: 0 %
Neutro Abs: 4.9 10*3/uL (ref 1.7–7.7)
Neutrophils Relative %: 14 %
Platelets: 252 10*3/uL (ref 150–400)
RBC: 3.69 MIL/uL — ABNORMAL LOW (ref 3.87–5.11)
RDW: 14.2 % (ref 11.5–15.5)
WBC: 34.7 10*3/uL — ABNORMAL HIGH (ref 4.0–10.5)
nRBC: 0 % (ref 0.0–0.2)

## 2020-02-10 LAB — BASIC METABOLIC PANEL
Anion gap: 11 (ref 5–15)
BUN: 25 mg/dL — ABNORMAL HIGH (ref 8–23)
CO2: 25 mmol/L (ref 22–32)
Calcium: 8.9 mg/dL (ref 8.9–10.3)
Chloride: 103 mmol/L (ref 98–111)
Creatinine, Ser: 1.24 mg/dL — ABNORMAL HIGH (ref 0.44–1.00)
GFR calc Af Amer: 50 mL/min — ABNORMAL LOW (ref >60)
GFR calc non Af Amer: 43 mL/min — ABNORMAL LOW (ref >60)
Glucose, Bld: 179 mg/dL — ABNORMAL HIGH (ref 70–99)
Potassium: 4.5 mmol/L (ref 3.5–5.1)
Sodium: 139 mmol/L (ref 135–145)

## 2020-02-10 LAB — FERRITIN: Ferritin: 66 ng/mL (ref 11–307)

## 2020-02-10 NOTE — Progress Notes (Signed)
Patient has no questions or concerns at this time 

## 2020-05-07 ENCOUNTER — Other Ambulatory Visit: Payer: Self-pay | Admitting: *Deleted

## 2020-05-07 ENCOUNTER — Inpatient Hospital Stay: Payer: Medicare Other | Attending: Hematology and Oncology

## 2020-05-07 ENCOUNTER — Other Ambulatory Visit: Payer: Self-pay

## 2020-05-07 DIAGNOSIS — C9110 Chronic lymphocytic leukemia of B-cell type not having achieved remission: Secondary | ICD-10-CM | POA: Insufficient documentation

## 2020-05-07 DIAGNOSIS — D649 Anemia, unspecified: Secondary | ICD-10-CM

## 2020-05-07 LAB — CBC WITH DIFFERENTIAL/PLATELET
Abs Immature Granulocytes: 0.06 10*3/uL (ref 0.00–0.07)
Basophils Absolute: 0.1 10*3/uL (ref 0.0–0.1)
Basophils Relative: 0 %
Eosinophils Absolute: 0.2 10*3/uL (ref 0.0–0.5)
Eosinophils Relative: 1 %
HCT: 34.8 % — ABNORMAL LOW (ref 36.0–46.0)
Hemoglobin: 11.1 g/dL — ABNORMAL LOW (ref 12.0–15.0)
Immature Granulocytes: 0 %
Lymphocytes Relative: 80 %
Lymphs Abs: 21.7 10*3/uL — ABNORMAL HIGH (ref 0.7–4.0)
MCH: 30.2 pg (ref 26.0–34.0)
MCHC: 31.9 g/dL (ref 30.0–36.0)
MCV: 94.8 fL (ref 80.0–100.0)
Monocytes Absolute: 0.3 10*3/uL (ref 0.1–1.0)
Monocytes Relative: 1 %
Neutro Abs: 4.8 10*3/uL (ref 1.7–7.7)
Neutrophils Relative %: 18 %
Platelets: 209 10*3/uL (ref 150–400)
RBC: 3.67 MIL/uL — ABNORMAL LOW (ref 3.87–5.11)
RDW: 14.1 % (ref 11.5–15.5)
Smear Review: NORMAL
WBC: 27.1 10*3/uL — ABNORMAL HIGH (ref 4.0–10.5)
nRBC: 0 % (ref 0.0–0.2)

## 2020-05-07 LAB — COMPREHENSIVE METABOLIC PANEL
ALT: 17 U/L (ref 0–44)
AST: 17 U/L (ref 15–41)
Albumin: 3.9 g/dL (ref 3.5–5.0)
Alkaline Phosphatase: 81 U/L (ref 38–126)
Anion gap: 6 (ref 5–15)
BUN: 32 mg/dL — ABNORMAL HIGH (ref 8–23)
CO2: 25 mmol/L (ref 22–32)
Calcium: 8.7 mg/dL — ABNORMAL LOW (ref 8.9–10.3)
Chloride: 108 mmol/L (ref 98–111)
Creatinine, Ser: 1.18 mg/dL — ABNORMAL HIGH (ref 0.44–1.00)
GFR calc Af Amer: 53 mL/min — ABNORMAL LOW (ref >60)
GFR calc non Af Amer: 46 mL/min — ABNORMAL LOW (ref >60)
Glucose, Bld: 238 mg/dL — ABNORMAL HIGH (ref 70–99)
Potassium: 4.1 mmol/L (ref 3.5–5.1)
Sodium: 139 mmol/L (ref 135–145)
Total Bilirubin: 0.3 mg/dL (ref 0.3–1.2)
Total Protein: 6.7 g/dL (ref 6.5–8.1)

## 2020-05-07 LAB — FERRITIN: Ferritin: 48 ng/mL (ref 11–307)

## 2020-05-11 ENCOUNTER — Other Ambulatory Visit: Payer: Medicare Other

## 2020-08-09 NOTE — Progress Notes (Signed)
[facility]  [address] [place], [place] [address] Phone: [phone]  Fax: [phone]   Clinic Day:  08/10/2020  Referring physician: Wenda Low, MD  Chief Complaint: Tammy [name] is a 74 y.o. female with chronic lymphocytic leukemia (CLL) who is seen for 6 month assessment.  HPI: The patient was last seen in the hematology clinic on 02/10/2020. At that time, she was doing well.  She denied any B symptoms.  She had lost weight intentionally.  Hematocrit was 36.0, hemoglobin 11.3, MCV 97.6, platelets 252,000, WBC 34,700 (ANC 4900; ALC 28,500).  Surveillance continued.   CBC on 05/07/2020 revealed a hematocrit 34.8, hemoglobin 11.1, MCV 94.8, platelets 209,000, WBC 27,100 (ANC 4800; ALC 21,700).  Ferritin was 48.   During the interim, she has been well overall.   She notes that she is on Cefalaxin due to her recent surgery on her thumb for trigger finger.    Past Medical History:  Diagnosis Date  . Allergic rhinitis   . Chronic kidney disease   . Chronic lymphocytic leukemia ([place])   . DA (degenerative arthritis)   . Diabetes mellitus   . Headache   . Heart murmur   . History of kidney stones   . Hyperlipidemia   . Hypertension   . Hypertriglyceridemia   . Iron deficiency anemia   . Other elevated white blood cell count   . Unspecified deficiency anemia     Past Surgical History:  Procedure Laterality Date  . ABDOMINAL HYSTERECTOMY     50 years  . COLONOSCOPY  2008   most recent  . [place]   lask  . JOINT REPLACEMENT    . JOINT REPLACEMENT    . KNEE ARTHROTOMY Left 05/15/2018   Procedure: KNEE ARTHROTOMY,LYSIS OF ADHESIONS, POLY EXCHANGE;  Surgeon: Dereck Leep, MD;  Location: [facility] ORS;  Service: Orthopedics;  Laterality: Left;  . OOPHORECTOMY    . REFRACTIVE SURGERY Bilateral 1996  . REPLACEMENT TOTAL KNEE Right 2015  . REPLACEMENT TOTAL KNEE Left 2007    Family History  Problem Relation Age of Onset   . Cancer Father        gastric cancer  . Cancer Sister        gastric cancer  . Diabetes Mother   . Hypertension Mother   . Diabetes Brother   . Diabetes Brother   . Diabetes Sister   . Breast cancer Neg Hx     Social History:  reports that she has never smoked. She has never used smokeless tobacco. She reports that she does not drink alcohol and does not use drugs. She lives in [place]. She has three grandsons and one great granddaughter. The patient is alone today.   Allergies:  Allergies  Allergen Reactions  . Latex Rash  . Sulfa Antibiotics Rash    Current Medications: Current Outpatient Medications  Medication Sig Dispense Refill  . ACCU-CHEK GUIDE test strip USE AS DIRECTED 3 TO 4 TIMES PER DAY    . acetaminophen (TYLENOL) 500 MG tablet Take 1,000 mg by mouth every 6 (six) hours as needed for moderate pain or headache.    Marland Kitchen acyclovir (ZOVIRAX) 400 MG tablet Take 400 mg by mouth 3 (three) times daily as needed.    Marland Kitchen albuterol (PROVENTIL HFA;VENTOLIN HFA) 108 (90 Base) MCG/ACT inhaler TAKE 2 PUFFS BY MOUTH EVERY 4 HOURS AS NEEDED FOR COUGHING OR WHEEZING    . amLODipine (NORVASC) 5 MG tablet Take 5 mg  by mouth every evening.   11  . aspirin EC 81 MG tablet Take 81 mg by mouth daily.    . benazepril-hydrochlorthiazide (LOTENSIN HCT) 20-12.5 MG per tablet Take 1 tablet by mouth daily.     . Cholecalciferol 5000 units capsule Take 5,000 Units by mouth daily.     . ferrous sulfate 325 (65 FE) MG tablet Take 325 mg by mouth daily with breakfast.    . fluticasone (FLONASE) 50 MCG/ACT nasal spray Place 2 sprays into both nostrils daily.    Marland Kitchen glimepiride (AMARYL) 2 MG tablet Take 2 mg by mouth every morning.    . insulin degludec (TRESIBA FLEXTOUCH) 100 UNIT/ML SOPN FlexTouch Pen Inject 15 Units into the skin daily.     . meclizine (ANTIVERT) 25 MG tablet Take 25 mg by mouth 3 (three) times daily as needed for dizziness.    . Multiple Vitamins-Minerals (CEROVITE SENIOR) TABS  Take 1 tablet by mouth daily.    . simvastatin (ZOCOR) 10 MG tablet Take 10 mg by mouth at bedtime.   3  . sitaGLIPtin (JANUVIA) 50 MG tablet Take 50 mg by mouth daily.     . TRULICITY 1.5 BM/8.4XL SOPN SMARTSIG:1 Pre-Filled Pen Syringe SUB-Q Once a Week    . amoxicillin (AMOXIL) 500 MG capsule TAKE 4 CAPSULES BY MOUTH 1 HOUR PRIOR TO DENTAL APPOINTMENT (Patient not taking: Reported on 08/10/2020)  0   No current facility-administered medications for this visit.    Review of Systems  Constitutional: Positive for weight loss (5 pounds). Negative for chills, diaphoresis, fever and malaise/fatigue.       Feels "pretty good".  HENT: Negative.  Negative for congestion, ear pain, hearing loss, nosebleeds and sinus pain.   Eyes: Negative for double vision, photophobia and pain.       CRAO and retinal tear - followed by ophthalmology. Vision stable.  Respiratory: Negative.  Negative for cough, hemoptysis, sputum production and shortness of breath.   Cardiovascular: Negative.  Negative for chest pain, palpitations, orthopnea, leg swelling and PND.  Gastrointestinal: Negative.  Negative for abdominal pain, blood in stool, constipation, diarrhea, melena, nausea and vomiting.       Eating better.  Genitourinary: Negative.  Negative for dysuria, frequency and hematuria.  Musculoskeletal: Positive for joint pain (once in a while knee pain). Negative for back pain, falls, myalgias and neck pain.       Recent trigger finger surgery.  Skin: Negative.  Negative for itching and rash.  Neurological: Negative.  Negative for dizziness, tingling, tremors, sensory change, focal weakness, weakness and headaches.  Endo/Heme/Allergies: Negative.  Does not bruise/bleed easily.  Psychiatric/Behavioral: Negative.  Negative for depression and memory loss. The patient is not nervous/anxious and does not have insomnia.   All other systems reviewed and are negative.  Performance status (ECOG): 0  Vitals Blood pressure  (!) 145/68, pulse 78, temperature 97.7 F (36.5 C), temperature source Tympanic, resp. rate 18, height 5\' 10"  (1.778 m), weight 252 lb 3.3 oz (114.4 kg), SpO2 99 %.   Physical Exam Vitals and nursing note reviewed.  Constitutional:      Appearance: She is well-developed.  HENT:     Head: Normocephalic and atraumatic.     Comments: Short dark slightly curly hair.    Mouth/Throat:     Mouth: Mucous membranes are moist.     Pharynx: Oropharynx is clear. No oropharyngeal exudate.  Eyes:     General: No scleral icterus.    Conjunctiva/sclera: Conjunctivae normal.  Pupils: Pupils are equal, round, and reactive to light.  Neck:     Vascular: No JVD.  Cardiovascular:     Rate and Rhythm: Normal rate and regular rhythm.     Heart sounds: No murmur heard.  No friction rub. No gallop.   Pulmonary:     Effort: Pulmonary effort is normal.     Breath sounds: Normal breath sounds. No wheezing or rales.  Chest:     Chest wall: No tenderness.  Abdominal:     General: Bowel sounds are normal.     Palpations: Abdomen is soft. There is no hepatomegaly, splenomegaly or mass.     Tenderness: There is no abdominal tenderness. There is no guarding or rebound.  Musculoskeletal:        General: No tenderness. Normal range of motion.     Cervical back: Normal range of motion and neck supple.  Lymphadenopathy:     Head:     Right side of head: No preauricular, posterior auricular or occipital adenopathy.     Left side of head: No preauricular, posterior auricular or occipital adenopathy.     Cervical: No cervical adenopathy.     Upper Body:     Right upper body: No supraclavicular or axillary adenopathy.     Left upper body: No supraclavicular or axillary adenopathy.     Lower Body: No right inguinal adenopathy. No left inguinal adenopathy.  Skin:    General: Skin is warm and dry.     Coloration: Skin is not pale.     Findings: No erythema or rash.  Neurological:     Mental Status: She is  alert and oriented to person, place, and time.  Psychiatric:        Behavior: Behavior normal.        Thought Content: Thought content normal.        Judgment: Judgment normal.    Appointment on 08/10/2020  Component Date Value Ref Range Status  . Retic Ct Pct 08/10/2020 1.2  0.4 - 3.1 % Final  . RBC. 08/10/2020 4.00  3.87 - 5.11 MIL/uL Final  . Retic Count, Absolute 08/10/2020 49.6  19.0 - 186.0 K/uL Final  . Immature Retic Fract 08/10/2020 13.6  2.3 - 15.9 % Final   Performed at [facility], [address], [place], [place] [address]  . Iron 08/10/2020 76  28 - 170 ug/dL Final  . TIBC 08/10/2020 353  250 - 450 ug/dL Final  . Saturation Ratios 08/10/2020 22  10.4 - 31.8 % Final  . UIBC 08/10/2020 277  ug/dL Final   Performed at [facility], [address], [place], [place] [address]  . Ferritin 08/10/2020 73  11 - 307 ng/mL Final   Performed at [facility], [address] [place], [place] [address]  . Sodium 08/10/2020 137  135 - 145 mmol/L Final  . Potassium 08/10/2020 4.3  3.5 - 5.1 mmol/L Final  . Chloride 08/10/2020 103  98 - 111 mmol/L Final  . CO2 08/10/2020 25  22 - 32 mmol/L Final  . Glucose, Bld 08/10/2020 212* 70 - 99 mg/dL Final   Glucose reference range applies only to samples taken after fasting for at least 8 hours.  . BUN 08/10/2020 33* 8 - 23 mg/dL Final  . Creatinine, Ser 08/10/2020 1.36* 0.44 - 1.00 mg/dL Final  . Calcium 08/10/2020 8.7* 8.9 - 10.3 mg/dL Final  . Total Protein 08/10/2020 6.9  6.5 - 8.1 g/dL Final  . Albumin  08/10/2020 4.3  3.5 - 5.0 g/dL Final  . AST 08/10/2020 20  15 - 41 U/L Final  . ALT 08/10/2020 40  0 - 44 U/L Final  . Alkaline Phosphatase 08/10/2020 80  38 - 126 U/L Final  . Total Bilirubin 08/10/2020 0.9  0.3 - 1.2 mg/dL Final  . GFR calc non Af Amer 08/10/2020 39* >60 mL/min Final  . GFR calc Af Amer 08/10/2020 45* >60 mL/min Final  . Anion gap 08/10/2020 9  5 - 15 Final   Performed at [facility], [address], [place], [place] [address]  . WBC 08/10/2020 46.8* 4.0 - 10.5 K/uL Final  . RBC 08/10/2020 4.08  3.87 - 5.11 MIL/uL Final  . Hemoglobin 08/10/2020 12.3  12.0 - 15.0 g/dL Final  . HCT 08/10/2020 38.1  36 - 46 % Final  . MCV 08/10/2020 93.4  80.0 - 100.0 fL Final  . MCH 08/10/2020 30.1  26.0 - 34.0 pg Final  . MCHC 08/10/2020 32.3  30.0 - 36.0 g/dL Final  . RDW 08/10/2020 13.7  11.5 - 15.5 % Final  . Platelets 08/10/2020 254  150 - 400 K/uL Final  . nRBC 08/10/2020 0.0  0.0 - 0.2 % Final  . Neutrophils Relative % 08/10/2020 17  % Final  . Neutro Abs 08/10/2020 7.9* 1.7 - 7.7 K/uL Final  . Lymphocytes Relative 08/10/2020 83  % Final  . Lymphs Abs 08/10/2020 38.5* 0.7 - 4.0 K/uL Final  . Monocytes Relative 08/10/2020 0  % Final  . Monocytes Absolute 08/10/2020 0.1  0 - 1 K/uL Final  . Eosinophils Relative 08/10/2020 0  % Final  . Eosinophils Absolute 08/10/2020 0.1  0 - 0 K/uL Final  . Basophils Relative 08/10/2020 0  % Final  . Basophils Absolute 08/10/2020 0.1  0 - 0 K/uL Final  . Immature Granulocytes 08/10/2020 0  % Final  . Abs Immature Granulocytes 08/10/2020 0.14* 0.00 - 0.07 K/uL Final   Performed at [facility], [address] [place], [place] [address]    Assessment:  Tammy [name] is a 74 y.o. female withstage 0 CLLdiagnosed in 07/2009. She presented with lymphocytosis. WBC has ranged between 13,600 -. 32,500 without a trend. Platelet count is normal. She has had no issues with bruising or bleeding.   She has a history ofiron deficiency anemia. Diet is good (she eats meat 4 times/week). She is on oral iron (ferrous sulfate) every other day. She denies any bleeding. Colonoscopy is due in 2018. Guaiac cards were - x 3 in 05/2015. She haschronic kidney disease(CrCl 46 ml/min). B12 and folate were normal on 06/14/2016.  Mammogramon 10/04/2016 was negative. Colonoscopyon 07/12/2017 revealed three 4-6 mm polyps.  Pathology revealed tubular adenomas in the ascending colon and rectum. The cecum polyp was polypoid colonic mucosa negative for dysplasia. Repeat colonoscopy is planned in 5 years.  Echocardiogramon 10/16/2018 demonstrated an LVEF of 60-65%. Severe focal basal hypertrophy of the septum noted, which is consistent with hypertrophic cardiomyopathy.   BILATERAL carotid duplexon 10/16/2018 demonstrated minimal heterogenous and calcified plaque, with no significant stenosis.   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 Presented with painless loss of vision in her RIGHT eye. CT head was negative with no acute intracranial abnormality noted. MRI of the brain was negative with no acute intracranial abnormality.   Symptomatically, she is doing well.  She denies any fevers, sweats or weight loss.  Exam reveals no adenopathy or hepatosplenomegaly.  Plan: 1.   Labs today: CBC with diff, CMP, ferritin, iron studies, retic. 2.   Chronic lymphocytic leukemia (CLL)             Clinically, she continues to do well.  Exam reveals no adenopathy or hepatosplenomegaly.             Hematocrit 38.1.  Hemoglobin 12.3.  MCV 93.4.  Platelets 254,000.  WBC 46,800.             Continue surveillance. 3.   Mild normocytic anemia, resolved Hemoglobin 12.3.    Ferritin 73 with an iron saturation of 22% and a TIBC of 353. Continue to monitor. 4.   Chronic kidney disease             Creatinine 1.36  (CrCl 50.5 ml/minute).             Patient followed by nephrology. 5.     RTC in 6 months for MD assessment and labs (CBC with diff, CMP, ferritin).  I discussed the assessment and treatment plan with the patient.  The patient was provided an opportunity to ask questions and all were answered.  The patient agreed with the plan and demonstrated an understanding of the instructions.  The patient was advised to call back if the symptoms worsen or if the condition fails to improve  as anticipated.   Lequita Asal, MD, PhD    08/10/2020, 8:46 PM  I, Jacqualyn Posey, am acting as a Education administrator for [employer]. Mike Gip, MD.   I, Jamyah Folk C. Mike Gip, MD, have reviewed the above documentation for accuracy and completeness, and I agree with the above.

## 2020-08-10 ENCOUNTER — Other Ambulatory Visit: Payer: Self-pay

## 2020-08-10 ENCOUNTER — Inpatient Hospital Stay: Payer: Medicare Other | Attending: Hematology and Oncology | Admitting: Hematology and Oncology

## 2020-08-10 ENCOUNTER — Other Ambulatory Visit: Payer: Self-pay | Admitting: Hematology and Oncology

## 2020-08-10 ENCOUNTER — Encounter: Payer: Self-pay | Admitting: Hematology and Oncology

## 2020-08-10 ENCOUNTER — Inpatient Hospital Stay: Payer: Medicare Other

## 2020-08-10 VITALS — BP 145/68 | HR 78 | Temp 97.7°F | Resp 18 | Ht 70.0 in | Wt 252.2 lb

## 2020-08-10 DIAGNOSIS — Z79899 Other long term (current) drug therapy: Secondary | ICD-10-CM | POA: Insufficient documentation

## 2020-08-10 DIAGNOSIS — D649 Anemia, unspecified: Secondary | ICD-10-CM

## 2020-08-10 DIAGNOSIS — M1990 Unspecified osteoarthritis, unspecified site: Secondary | ICD-10-CM | POA: Insufficient documentation

## 2020-08-10 DIAGNOSIS — N189 Chronic kidney disease, unspecified: Secondary | ICD-10-CM | POA: Diagnosis not present

## 2020-08-10 DIAGNOSIS — Z7982 Long term (current) use of aspirin: Secondary | ICD-10-CM | POA: Diagnosis not present

## 2020-08-10 DIAGNOSIS — C9110 Chronic lymphocytic leukemia of B-cell type not having achieved remission: Secondary | ICD-10-CM | POA: Diagnosis present

## 2020-08-10 DIAGNOSIS — E781 Pure hyperglyceridemia: Secondary | ICD-10-CM | POA: Diagnosis not present

## 2020-08-10 DIAGNOSIS — E785 Hyperlipidemia, unspecified: Secondary | ICD-10-CM | POA: Diagnosis not present

## 2020-08-10 DIAGNOSIS — E1122 Type 2 diabetes mellitus with diabetic chronic kidney disease: Secondary | ICD-10-CM | POA: Diagnosis not present

## 2020-08-10 DIAGNOSIS — Z794 Long term (current) use of insulin: Secondary | ICD-10-CM | POA: Diagnosis not present

## 2020-08-10 DIAGNOSIS — I129 Hypertensive chronic kidney disease with stage 1 through stage 4 chronic kidney disease, or unspecified chronic kidney disease: Secondary | ICD-10-CM | POA: Diagnosis not present

## 2020-08-10 LAB — COMPREHENSIVE METABOLIC PANEL
ALT: 40 U/L (ref 0–44)
AST: 20 U/L (ref 15–41)
Albumin: 4.3 g/dL (ref 3.5–5.0)
Alkaline Phosphatase: 80 U/L (ref 38–126)
Anion gap: 9 (ref 5–15)
BUN: 33 mg/dL — ABNORMAL HIGH (ref 8–23)
CO2: 25 mmol/L (ref 22–32)
Calcium: 8.7 mg/dL — ABNORMAL LOW (ref 8.9–10.3)
Chloride: 103 mmol/L (ref 98–111)
Creatinine, Ser: 1.36 mg/dL — ABNORMAL HIGH (ref 0.44–1.00)
GFR calc Af Amer: 45 mL/min — ABNORMAL LOW (ref >60)
GFR calc non Af Amer: 39 mL/min — ABNORMAL LOW (ref >60)
Glucose, Bld: 212 mg/dL — ABNORMAL HIGH (ref 70–99)
Potassium: 4.3 mmol/L (ref 3.5–5.1)
Sodium: 137 mmol/L (ref 135–145)
Total Bilirubin: 0.9 mg/dL (ref 0.3–1.2)
Total Protein: 6.9 g/dL (ref 6.5–8.1)

## 2020-08-10 LAB — FERRITIN: Ferritin: 73 ng/mL (ref 11–307)

## 2020-08-10 LAB — CBC WITH DIFFERENTIAL/PLATELET
Abs Immature Granulocytes: 0.14 10*3/uL — ABNORMAL HIGH (ref 0.00–0.07)
Basophils Absolute: 0.1 10*3/uL (ref 0.0–0.1)
Basophils Relative: 0 %
Eosinophils Absolute: 0.1 10*3/uL (ref 0.0–0.5)
Eosinophils Relative: 0 %
HCT: 38.1 % (ref 36.0–46.0)
Hemoglobin: 12.3 g/dL (ref 12.0–15.0)
Immature Granulocytes: 0 %
Lymphocytes Relative: 83 %
Lymphs Abs: 38.5 10*3/uL — ABNORMAL HIGH (ref 0.7–4.0)
MCH: 30.1 pg (ref 26.0–34.0)
MCHC: 32.3 g/dL (ref 30.0–36.0)
MCV: 93.4 fL (ref 80.0–100.0)
Monocytes Absolute: 0.1 10*3/uL (ref 0.1–1.0)
Monocytes Relative: 0 %
Neutro Abs: 7.9 10*3/uL — ABNORMAL HIGH (ref 1.7–7.7)
Neutrophils Relative %: 17 %
Platelets: 254 10*3/uL (ref 150–400)
RBC: 4.08 MIL/uL (ref 3.87–5.11)
RDW: 13.7 % (ref 11.5–15.5)
WBC: 46.8 10*3/uL — ABNORMAL HIGH (ref 4.0–10.5)
nRBC: 0 % (ref 0.0–0.2)

## 2020-08-10 LAB — IRON AND TIBC
Iron: 76 ug/dL (ref 28–170)
Saturation Ratios: 22 % (ref 10.4–31.8)
TIBC: 353 ug/dL (ref 250–450)
UIBC: 277 ug/dL

## 2020-08-10 LAB — RETICULOCYTES
Immature Retic Fract: 13.6 % (ref 2.3–15.9)
RBC.: 4 MIL/uL (ref 3.87–5.11)
Retic Count, Absolute: 49.6 10*3/uL (ref 19.0–186.0)
Retic Ct Pct: 1.2 % (ref 0.4–3.1)

## 2020-08-10 NOTE — Progress Notes (Signed)
No new changes noted today 

## 2020-09-22 ENCOUNTER — Other Ambulatory Visit: Payer: Self-pay | Admitting: Internal Medicine

## 2020-09-22 DIAGNOSIS — Z1231 Encounter for screening mammogram for malignant neoplasm of breast: Secondary | ICD-10-CM

## 2020-10-19 IMAGING — DX DG SHOULDER 2+V*L*
3 series · 3 of 3 positions shown · non-contrast
Comparison: None.

CLINICAL DATA: Left posterior shoulder pain

EXAM:
LEFT SHOULDER - 2+ VIEW

[dg shoulder left (1 of 3)]
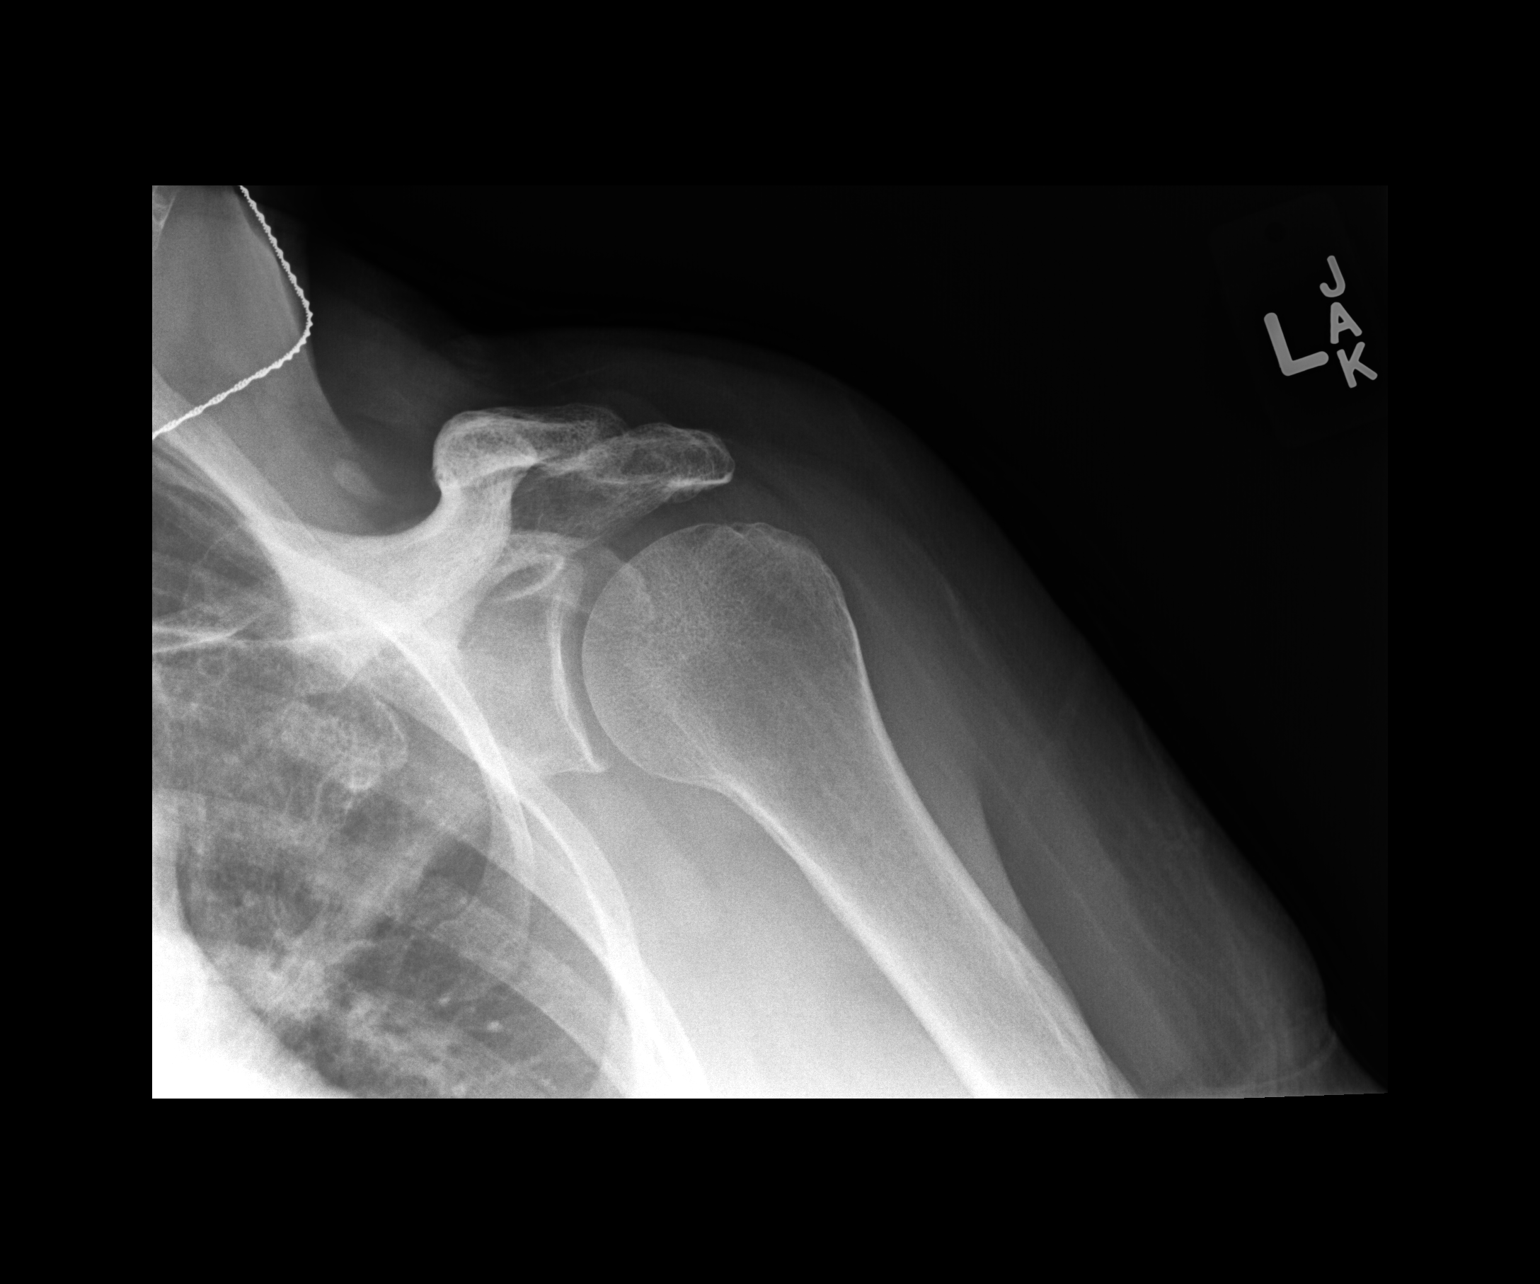

[dg shoulder left (2 of 3)]
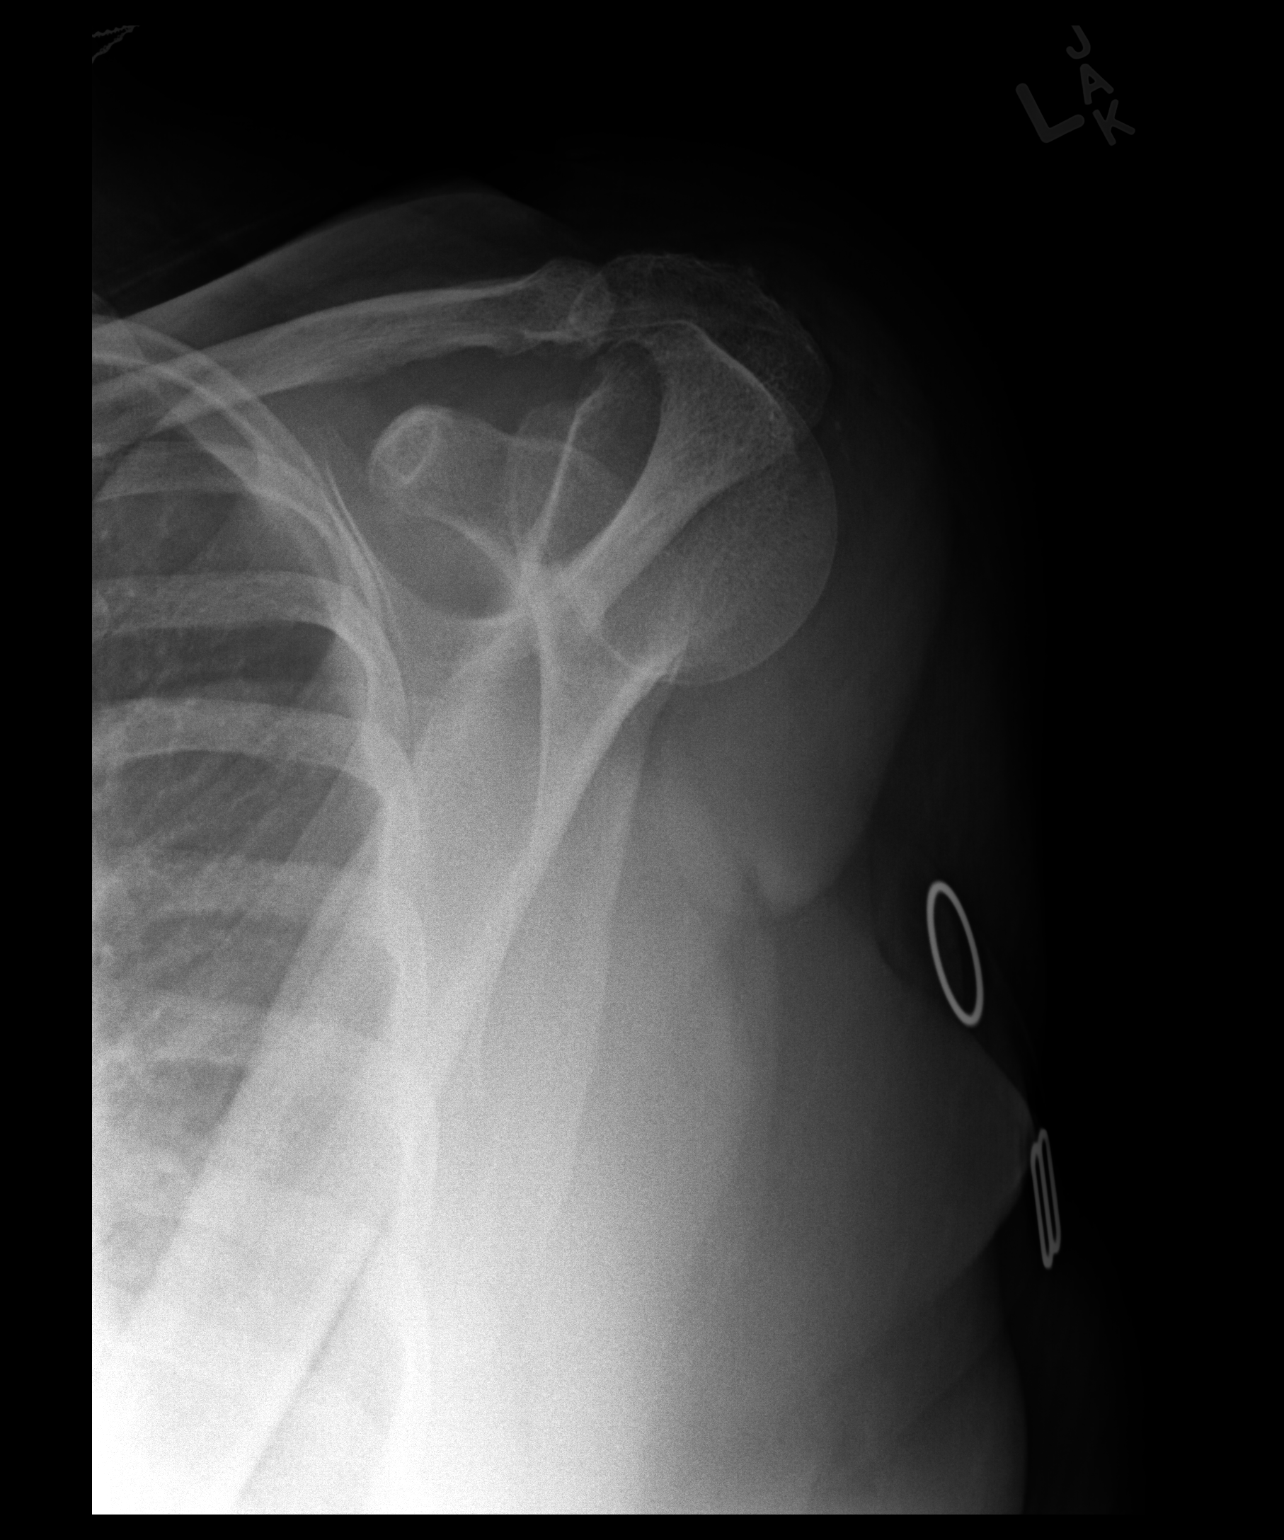

[dg shoulder left (3 of 3)]
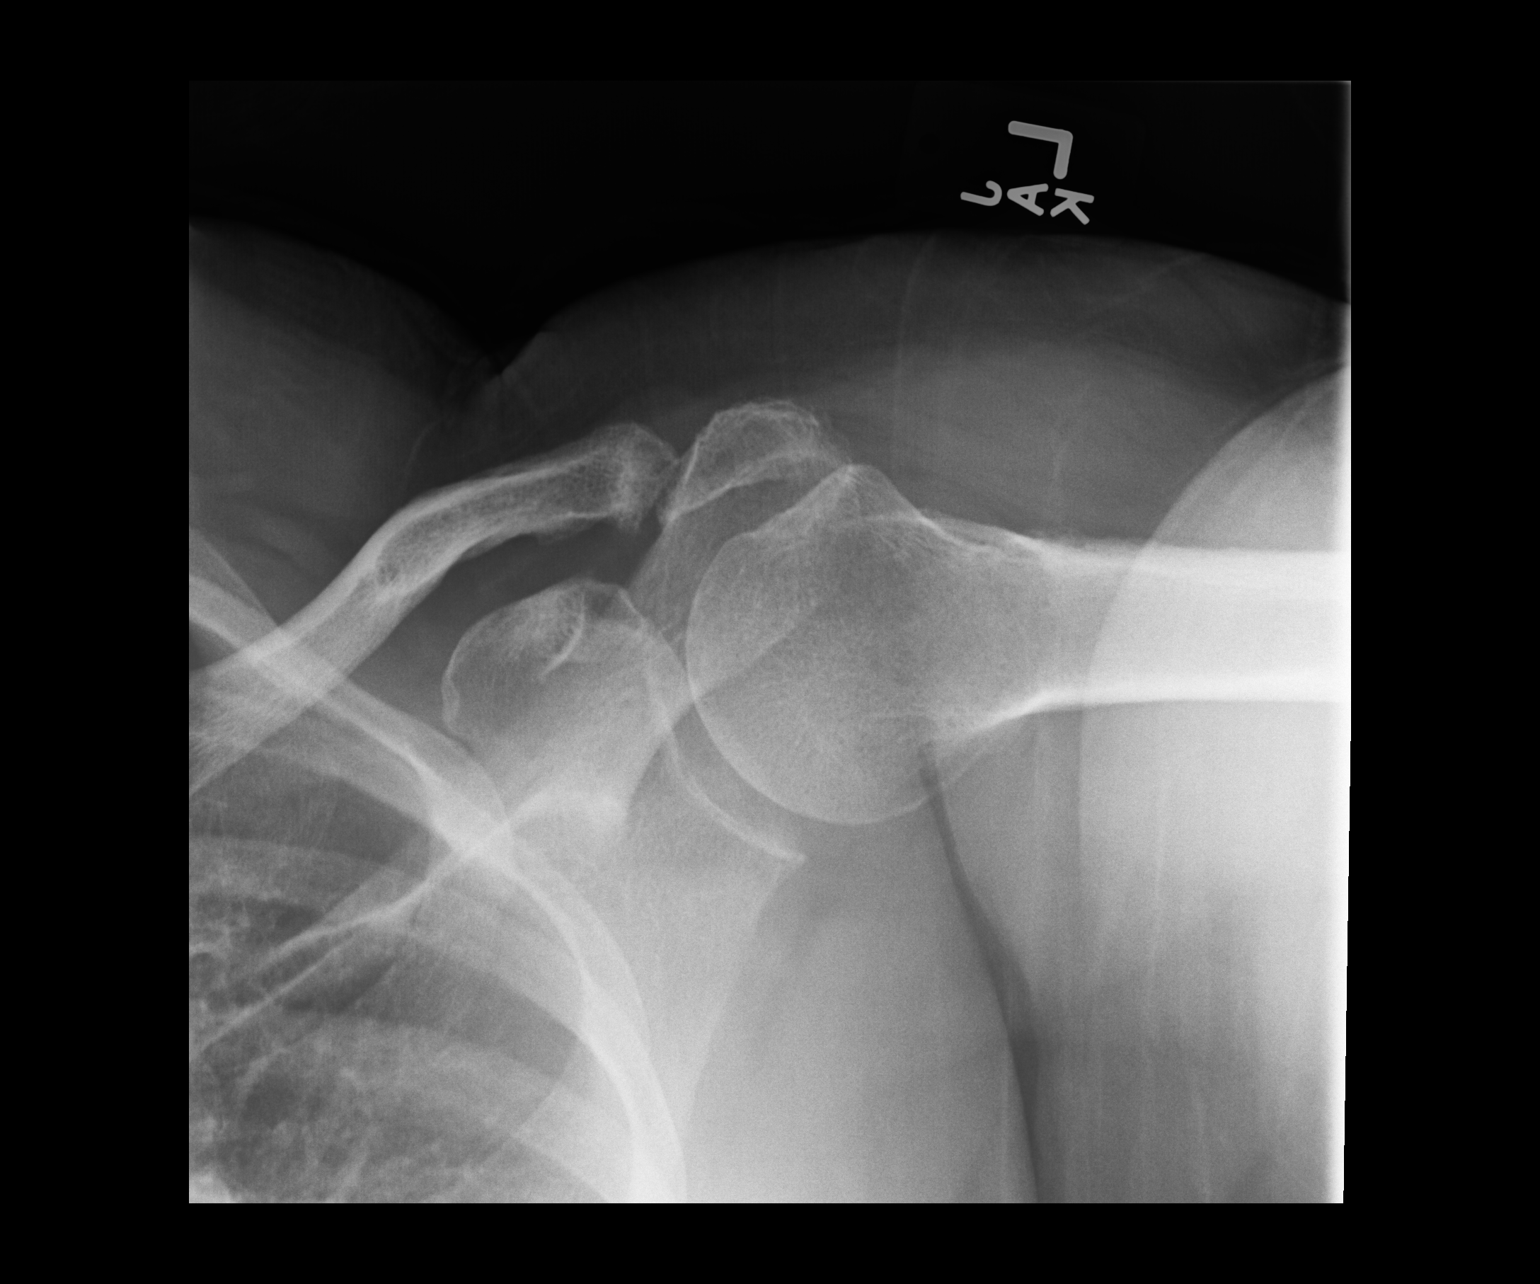

[3 of 3 positions shown; findings below may reference images not displayed]

FINDINGS: No acute fracture. No dislocation. Unremarkable soft tissues. Mild
inferior AC joint osteophytes are noted.
IMPRESSION: No acute bony pathology.

## 2020-10-29 ENCOUNTER — Other Ambulatory Visit: Payer: Self-pay

## 2020-10-29 ENCOUNTER — Ambulatory Visit
Admission: RE | Admit: 2020-10-29 | Discharge: 2020-10-29 | Disposition: A | Discharge status: Home / Self Care | Payer: Medicare Other | Source: Ambulatory Visit | Attending: Internal Medicine | Admitting: Internal Medicine

## 2020-10-29 DIAGNOSIS — Z1231 Encounter for screening mammogram for malignant neoplasm of breast: Secondary | ICD-10-CM | POA: Diagnosis not present

## 2020-11-25 DIAGNOSIS — M65312 Trigger thumb, left thumb: Secondary | ICD-10-CM | POA: Diagnosis not present

## 2020-12-08 DIAGNOSIS — M1990 Unspecified osteoarthritis, unspecified site: Secondary | ICD-10-CM | POA: Diagnosis not present

## 2020-12-08 DIAGNOSIS — D638 Anemia in other chronic diseases classified elsewhere: Secondary | ICD-10-CM | POA: Diagnosis not present

## 2020-12-08 DIAGNOSIS — E782 Mixed hyperlipidemia: Secondary | ICD-10-CM | POA: Diagnosis not present

## 2020-12-08 DIAGNOSIS — E1122 Type 2 diabetes mellitus with diabetic chronic kidney disease: Secondary | ICD-10-CM | POA: Diagnosis not present

## 2020-12-08 DIAGNOSIS — I10 Essential (primary) hypertension: Secondary | ICD-10-CM | POA: Diagnosis not present

## 2020-12-08 DIAGNOSIS — E1165 Type 2 diabetes mellitus with hyperglycemia: Secondary | ICD-10-CM | POA: Diagnosis not present

## 2020-12-08 DIAGNOSIS — N1831 Chronic kidney disease, stage 3a: Secondary | ICD-10-CM | POA: Diagnosis not present

## 2021-01-08 DIAGNOSIS — R059 Cough, unspecified: Secondary | ICD-10-CM | POA: Diagnosis not present

## 2021-01-08 DIAGNOSIS — U071 COVID-19: Secondary | ICD-10-CM | POA: Diagnosis not present

## 2021-01-08 DIAGNOSIS — Z20822 Contact with and (suspected) exposure to covid-19: Secondary | ICD-10-CM | POA: Diagnosis not present

## 2021-01-10 DIAGNOSIS — N183 Chronic kidney disease, stage 3 unspecified: Secondary | ICD-10-CM | POA: Diagnosis not present

## 2021-01-10 DIAGNOSIS — E782 Mixed hyperlipidemia: Secondary | ICD-10-CM | POA: Diagnosis not present

## 2021-01-10 DIAGNOSIS — E1122 Type 2 diabetes mellitus with diabetic chronic kidney disease: Secondary | ICD-10-CM | POA: Diagnosis not present

## 2021-01-10 DIAGNOSIS — I10 Essential (primary) hypertension: Secondary | ICD-10-CM | POA: Diagnosis not present

## 2021-01-10 DIAGNOSIS — M1990 Unspecified osteoarthritis, unspecified site: Secondary | ICD-10-CM | POA: Diagnosis not present

## 2021-01-10 DIAGNOSIS — N1831 Chronic kidney disease, stage 3a: Secondary | ICD-10-CM | POA: Diagnosis not present

## 2021-01-10 DIAGNOSIS — E1165 Type 2 diabetes mellitus with hyperglycemia: Secondary | ICD-10-CM | POA: Diagnosis not present

## 2021-01-17 DIAGNOSIS — E782 Mixed hyperlipidemia: Secondary | ICD-10-CM | POA: Diagnosis not present

## 2021-01-17 DIAGNOSIS — Z794 Long term (current) use of insulin: Secondary | ICD-10-CM | POA: Diagnosis not present

## 2021-01-17 DIAGNOSIS — C9110 Chronic lymphocytic leukemia of B-cell type not having achieved remission: Secondary | ICD-10-CM | POA: Diagnosis not present

## 2021-01-17 DIAGNOSIS — E1122 Type 2 diabetes mellitus with diabetic chronic kidney disease: Secondary | ICD-10-CM | POA: Diagnosis not present

## 2021-01-17 DIAGNOSIS — N183 Chronic kidney disease, stage 3 unspecified: Secondary | ICD-10-CM | POA: Diagnosis not present

## 2021-01-17 DIAGNOSIS — I10 Essential (primary) hypertension: Secondary | ICD-10-CM | POA: Diagnosis not present

## 2021-02-03 NOTE — Progress Notes (Signed)
[facility]  [address] [place], [place] [address] Phone: [phone]  Fax: [phone]   Clinic Day: 02/07/21  Referring physician: Wenda Low, MD  Chief Complaint: Tammy [name] is a 75 y.o. female with chronic lymphocytic leukemia (CLL) who is seen for 6 month assessment.  HPI: The patient was last seen in the hematology clinic on 08/10/2020. At that time, she was doing well.  She denied any fevers, sweats or weight loss.  Exam revealed no adenopathy or hepatosplenomegaly. Hematocrit was 38.1, hemoglobin 12.3, platelets 254,000, WBC 46,800 (ANC 7900; ALC 38,500).  Creatinine was 1.36 (CrCl 45 ml/min). Calcium was 8.7. Ferritin was 73 with an iron saturation of 33% and a TIBC of 353. Reticulocyte count was 1.2%. We discussed continued surveillance.  Bilateral screening mammogram on 10/29/2020 revealed no evidence of malignancy.  During the interim, she has been "okay." She is intentionally losing weight by watching her carb intake. She does not have a goal weight. She denies, lumps, bumps, bruising, bleeding, and infections. Her vision is stable. Her knee is stiff some days.   Past Medical History:  Diagnosis Date  . Allergic rhinitis   . Chronic kidney disease   . Chronic lymphocytic leukemia ([place])   . CLL (chronic lymphocytic leukemia) ([place])   . DA (degenerative arthritis)   . Diabetes mellitus   . Headache   . Heart murmur   . History of kidney stones   . Hyperlipidemia   . Hypertension   . Hypertriglyceridemia   . Iron deficiency anemia   . Other elevated white blood cell count   . Unspecified deficiency anemia     Past Surgical History:  Procedure Laterality Date  . ABDOMINAL HYSTERECTOMY     50 years  . COLONOSCOPY  2008   most recent  . [place]   lask  . JOINT REPLACEMENT    . JOINT REPLACEMENT    . KNEE ARTHROTOMY Left 05/15/2018   Procedure: KNEE ARTHROTOMY,LYSIS OF ADHESIONS, POLY EXCHANGE;  Surgeon:  Dereck Leep, MD;  Location: [facility] ORS;  Service: Orthopedics;  Laterality: Left;  . OOPHORECTOMY    . REFRACTIVE SURGERY Bilateral 1996  . REPLACEMENT TOTAL KNEE Right 2015  . REPLACEMENT TOTAL KNEE Left 2007    Family History  Problem Relation Age of Onset  . Cancer Father        gastric cancer  . Cancer Sister        gastric cancer  . Diabetes Mother   . Hypertension Mother   . Diabetes Brother   . Diabetes Brother   . Diabetes Sister   . Breast cancer Neg Hx     Social History:  reports that she has never smoked. She has never used smokeless tobacco. She reports that she does not drink alcohol and does not use drugs. She lives in [place]. She has three grandsons and one great granddaughter. The patient is alone today.   Allergies:  Allergies  Allergen Reactions  . Latex Rash  . Sulfa Antibiotics Rash    Current Medications: Current Outpatient Medications  Medication Sig Dispense Refill  . ACCU-CHEK GUIDE test strip USE AS DIRECTED 3 TO 4 TIMES PER DAY    . acetaminophen (TYLENOL) 500 MG tablet Take 1,000 mg by mouth every 6 (six) hours as needed for moderate pain or headache.    . albuterol (PROVENTIL HFA;VENTOLIN HFA) 108 (90 Base) MCG/ACT inhaler TAKE 2 PUFFS BY MOUTH EVERY 4 HOURS AS NEEDED FOR  COUGHING OR WHEEZING    . amLODipine (NORVASC) 5 MG tablet Take 5 mg by mouth every evening.   11  . aspirin EC 81 MG tablet Take 81 mg by mouth daily.    . benazepril-hydrochlorthiazide (LOTENSIN HCT) 20-12.5 MG per tablet Take 1 tablet by mouth daily.     . Cholecalciferol 5000 units capsule Take 5,000 Units by mouth daily.     . ferrous sulfate 325 (65 FE) MG tablet Take 325 mg by mouth daily with breakfast.    . fluticasone (FLONASE) 50 MCG/ACT nasal spray Place 2 sprays into both nostrils daily as needed.    Marland Kitchen glimepiride (AMARYL) 2 MG tablet Take 2 mg by mouth in the morning and at bedtime.    . insulin degludec (TRESIBA) 100 UNIT/ML FlexTouch Pen Inject 16 Units  into the skin daily.    . Multiple Vitamins-Minerals (CEROVITE SENIOR) TABS Take 1 tablet by mouth daily.    . simvastatin (ZOCOR) 10 MG tablet Take 10 mg by mouth at bedtime.   3  . sitaGLIPtin (JANUVIA) 50 MG tablet Take 50 mg by mouth daily.     Marland Kitchen amoxicillin (AMOXIL) 500 MG capsule TAKE 4 CAPSULES BY MOUTH 1 HOUR PRIOR TO DENTAL APPOINTMENT (Patient not taking: No sig reported)  0  . meclizine (ANTIVERT) 25 MG tablet Take 25 mg by mouth 3 (three) times daily as needed for dizziness. (Patient not taking: Reported on 02/07/2021)     No current facility-administered medications for this visit.    Review of Systems  Constitutional: Positive for weight loss (4 lbs). Negative for chills, diaphoresis, fever and malaise/fatigue.       Feels "okay."  HENT: Negative.  Negative for congestion, ear discharge, ear pain, hearing loss, nosebleeds, sinus pain, sore throat and tinnitus.   Eyes: Negative for double vision, photophobia and pain.       CRAO and retinal tear - followed by ophthalmology. Vision stable.  Respiratory: Negative.  Negative for cough, hemoptysis, sputum production and shortness of breath.   Cardiovascular: Negative.  Negative for chest pain, palpitations and leg swelling.  Gastrointestinal: Negative.  Negative for abdominal pain, blood in stool, constipation, diarrhea, heartburn, melena, nausea and vomiting.  Genitourinary: Negative.  Negative for dysuria, frequency and hematuria.  Musculoskeletal: Positive for joint pain (knee stiffness some days). Negative for back pain, falls, myalgias and neck pain.  Skin: Negative.  Negative for itching and rash.  Neurological: Negative.  Negative for dizziness, tingling, tremors, sensory change, focal weakness, weakness and headaches.  Endo/Heme/Allergies: Negative.  Does not bruise/bleed easily.  Psychiatric/Behavioral: Negative.  Negative for depression and memory loss. The patient is not nervous/anxious and does not have insomnia.   All  other systems reviewed and are negative.  Performance status (ECOG): 0  Vitals Blood pressure 138/75, pulse 75, temperature 97.9 F (36.6 C), resp. rate 16, height 5\' 9"  (1.753 m), weight 248 lb 0.3 oz (112.5 kg).   Physical Exam Vitals and nursing note reviewed.  Constitutional:      Appearance: She is well-developed.  HENT:     Head: Normocephalic and atraumatic.     Comments: Short dark slightly curly hair.    Mouth/Throat:     Mouth: Mucous membranes are moist.     Pharynx: Oropharynx is clear. No oropharyngeal exudate.  Eyes:     General: No scleral icterus.    Conjunctiva/sclera: Conjunctivae normal.     Pupils: Pupils are equal, round, and reactive to light.  Neck:  Vascular: No JVD.  Cardiovascular:     Rate and Rhythm: Normal rate and regular rhythm.     Heart sounds: No murmur heard. No friction rub. No gallop.   Pulmonary:     Effort: Pulmonary effort is normal.     Breath sounds: Normal breath sounds. No wheezing or rales.  Chest:     Chest wall: No tenderness.  Breasts:     Right: No axillary adenopathy or supraclavicular adenopathy.     Left: No axillary adenopathy or supraclavicular adenopathy.    Abdominal:     General: Bowel sounds are normal.     Palpations: Abdomen is soft. There is no hepatomegaly, splenomegaly or mass.     Tenderness: There is no abdominal tenderness. There is no guarding or rebound.  Musculoskeletal:        General: No tenderness. Normal range of motion.     Cervical back: Normal range of motion and neck supple.     Comments: Shoulder muscles are tight.  Lymphadenopathy:     Head:     Right side of head: No preauricular, posterior auricular or occipital adenopathy.     Left side of head: No preauricular, posterior auricular or occipital adenopathy.     Cervical: No cervical adenopathy.     Upper Body:     Right upper body: No supraclavicular or axillary adenopathy.     Left upper body: No supraclavicular or axillary  adenopathy.     Lower Body: No right inguinal adenopathy. No left inguinal adenopathy.  Skin:    General: Skin is warm and dry.     Coloration: Skin is not pale.     Findings: No erythema or rash.  Neurological:     Mental Status: She is alert and oriented to person, place, and time.  Psychiatric:        Behavior: Behavior normal.        Thought Content: Thought content normal.        Judgment: Judgment normal.    Appointment on 02/07/2021  Component Date Value Ref Range Status  . Sodium 02/07/2021 144  135 - 145 mmol/L Final  . Potassium 02/07/2021 4.5  3.5 - 5.1 mmol/L Final  . Chloride 02/07/2021 106  98 - 111 mmol/L Final  . CO2 02/07/2021 28  22 - 32 mmol/L Final  . Glucose, Bld 02/07/2021 153* 70 - 99 mg/dL Final   Glucose reference range applies only to samples taken after fasting for at least 8 hours.  . BUN 02/07/2021 31* 8 - 23 mg/dL Final  . Creatinine, Ser 02/07/2021 1.05* 0.44 - 1.00 mg/dL Final  . Calcium 02/07/2021 9.6  8.9 - 10.3 mg/dL Final  . Total Protein 02/07/2021 6.8  6.5 - 8.1 g/dL Final  . Albumin 02/07/2021 4.1  3.5 - 5.0 g/dL Final  . AST 02/07/2021 18  15 - 41 U/L Final  . ALT 02/07/2021 19  0 - 44 U/L Final  . Alkaline Phosphatase 02/07/2021 72  38 - 126 U/L Final  . Total Bilirubin 02/07/2021 0.7  0.3 - 1.2 mg/dL Final  . GFR, Estimated 02/07/2021 56* >60 mL/min Final   Comment: (NOTE) Calculated using the CKD-EPI Creatinine Equation (2021)   . Anion gap 02/07/2021 10  5 - 15 Final   Performed at [facility], [address] [place], [place] [address]  . WBC 02/07/2021 29.6* 4.0 - 10.5 K/uL Final  . RBC 02/07/2021 3.73* 3.87 - 5.11 MIL/uL Final  . Hemoglobin 02/07/2021  11.4* 12.0 - 15.0 g/dL Final  . HCT 02/07/2021 35.1* 36.0 - 46.0 % Final  . MCV 02/07/2021 94.1  80.0 - 100.0 fL Final  . MCH 02/07/2021 30.6  26.0 - 34.0 pg Final  . MCHC 02/07/2021 32.5  30.0 - 36.0 g/dL Final  . RDW 02/07/2021 13.5  11.5 - 15.5 % Final  .  Platelets 02/07/2021 223  150 - 400 K/uL Final  . nRBC 02/07/2021 0.0  0.0 - 0.2 % Final  . Neutrophils Relative % 02/07/2021 16  % Final  . Neutro Abs 02/07/2021 4.6  1.7 - 7.7 K/uL Final  . Lymphocytes Relative 02/07/2021 82  % Final  . Lymphs Abs 02/07/2021 24.2* 0.7 - 4.0 K/uL Final  . Monocytes Relative 02/07/2021 2  % Final  . Monocytes Absolute 02/07/2021 0.5  0.1 - 1.0 K/uL Final  . Eosinophils Relative 02/07/2021 0  % Final  . Eosinophils Absolute 02/07/2021 0.1  0.0 - 0.5 K/uL Final  . Basophils Relative 02/07/2021 0  % Final  . Basophils Absolute 02/07/2021 0.1  0.0 - 0.1 K/uL Final  . WBC Morphology 02/07/2021 SMUDGE CELLS   Final  . RBC Morphology 02/07/2021 MORPHOLOGY UNREMARKABLE   Final  . Immature Granulocytes 02/07/2021 0  % Final  . Abs Immature Granulocytes 02/07/2021 0.06  0.00 - 0.07 K/uL Final  . Abnormal Lymphocytes Present 02/07/2021 PRESENT   Final   Performed at [facility], [address], [place], [place] [address]  . Ferritin 02/07/2021 60  11 - 307 ng/mL Final   Performed at [facility], [address] [place], [place] [address]    Assessment:  TIASHA HELVIE is a 75 y.o. female withstage 0 CLLdiagnosed in 07/2009. She presented with lymphocytosis. WBC has ranged between 13,600 -. 32,500 without a trend. Platelet count is normal. She has had no issues with bruising or bleeding.   She has a history ofiron deficiency anemia. Diet is good (she eats meat 4 times/week). She is on oral iron (ferrous sulfate) every other day. She denies any bleeding. Colonoscopy is due in 2018. Guaiac cards were - x 3 in 05/2015. She haschronic kidney disease(CrCl 46 ml/min). B12 and folate were normal on 06/14/2016.  Mammogramon 10/29/2020 revealed no evidence of malignancy. Colonoscopyon 07/12/2017 revealed three 4-6 mm polyps. Pathology revealed tubular adenomas in the ascending colon and rectum. The cecum polyp was polypoid  colonic mucosa negative for dysplasia. Repeat colonoscopy is planned in 5 years.  Echocardiogramon 10/16/2018 demonstrated an LVEF of 60-65%. Severe focal basal hypertrophy of the septum noted, which is consistent with hypertrophic cardiomyopathy.   BILATERAL carotid duplexon 10/16/2018 demonstrated minimal heterogenous and calcified plaque, with no significant stenosis.   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 Presented with painless loss of vision in her RIGHT eye. CT head was negative with no acute intracranial abnormality noted. MRI of the brain was negative with no acute intracranial abnormality.   Symptomatically, she feels "ok." She is intentionally losing weight by watching her carb intake.  She denies any fevers or sweats.  Exam reveals no adenopathy or hepatosplenomegaly.  Plan: 1.   Labs today: CBC with diff, CMP, ferritin. 2.   Chronic lymphocytic leukemia (CLL)             Clinically, she denies any B symptoms  Exam reveals no adenopathy or hepatosplenomegaly             Hematocrit 35.1.  Hemoglobin 11.4.  MCV  94.1.  Platelets 223,000.  WBC 29,600.             New surveillance. 3.   Mild normocytic anemia Hematocrit 35.1.  Hemoglobin 11.4.    Ferritin 60. Continue to monitor. 4.   Chronic kidney disease, improved             Creatinine 1.05  (CrCl 62.9 ml/minute).             Patient followed by nephrology. 5.   RTC in 6 months for MD assessment and labs (CBC with diff, CMP, ferritin, iron studies).   I discussed the assessment and treatment plan with the patient.  The patient was provided an opportunity to ask questions and all were answered.  The patient agreed with the plan and demonstrated an understanding of the instructions.  The patient was advised to call back if the symptoms worsen or if the condition fails to improve as anticipated.   Lequita Asal, MD, PhD    02/07/2021, 5:00 PM  I, Mirian Mo Tufford, am  acting as a Education administrator for [employer]. Mike Gip, MD.   I, Carlynn Leduc C. Mike Gip, MD, have reviewed the above documentation for accuracy and completeness, and I agree with the above.

## 2021-02-07 ENCOUNTER — Other Ambulatory Visit: Payer: Self-pay

## 2021-02-07 ENCOUNTER — Encounter: Payer: Self-pay | Admitting: Hematology and Oncology

## 2021-02-07 ENCOUNTER — Inpatient Hospital Stay: Payer: Medicare Other | Attending: Hematology and Oncology | Admitting: Hematology and Oncology

## 2021-02-07 ENCOUNTER — Inpatient Hospital Stay: Payer: Medicare Other

## 2021-02-07 VITALS — BP 138/75 | HR 75 | Temp 97.9°F | Resp 16 | Ht 69.0 in | Wt 248.0 lb

## 2021-02-07 DIAGNOSIS — C9110 Chronic lymphocytic leukemia of B-cell type not having achieved remission: Secondary | ICD-10-CM

## 2021-02-07 DIAGNOSIS — D649 Anemia, unspecified: Secondary | ICD-10-CM | POA: Diagnosis not present

## 2021-02-07 LAB — COMPREHENSIVE METABOLIC PANEL
ALT: 19 U/L (ref 0–44)
AST: 18 U/L (ref 15–41)
Albumin: 4.1 g/dL (ref 3.5–5.0)
Alkaline Phosphatase: 72 U/L (ref 38–126)
Anion gap: 10 (ref 5–15)
BUN: 31 mg/dL — ABNORMAL HIGH (ref 8–23)
CO2: 28 mmol/L (ref 22–32)
Calcium: 9.6 mg/dL (ref 8.9–10.3)
Chloride: 106 mmol/L (ref 98–111)
Creatinine, Ser: 1.05 mg/dL — ABNORMAL HIGH (ref 0.44–1.00)
GFR, Estimated: 56 mL/min — ABNORMAL LOW (ref >60)
Glucose, Bld: 153 mg/dL — ABNORMAL HIGH (ref 70–99)
Potassium: 4.5 mmol/L (ref 3.5–5.1)
Sodium: 144 mmol/L (ref 135–145)
Total Bilirubin: 0.7 mg/dL (ref 0.3–1.2)
Total Protein: 6.8 g/dL (ref 6.5–8.1)

## 2021-02-07 LAB — CBC WITH DIFFERENTIAL/PLATELET
Abs Immature Granulocytes: 0.06 10*3/uL (ref 0.00–0.07)
Basophils Absolute: 0.1 10*3/uL (ref 0.0–0.1)
Basophils Relative: 0 %
Eosinophils Absolute: 0.1 10*3/uL (ref 0.0–0.5)
Eosinophils Relative: 0 %
HCT: 35.1 % — ABNORMAL LOW (ref 36.0–46.0)
Hemoglobin: 11.4 g/dL — ABNORMAL LOW (ref 12.0–15.0)
Immature Granulocytes: 0 %
Lymphocytes Relative: 82 %
Lymphs Abs: 24.2 10*3/uL — ABNORMAL HIGH (ref 0.7–4.0)
MCH: 30.6 pg (ref 26.0–34.0)
MCHC: 32.5 g/dL (ref 30.0–36.0)
MCV: 94.1 fL (ref 80.0–100.0)
Monocytes Absolute: 0.5 10*3/uL (ref 0.1–1.0)
Monocytes Relative: 2 %
Neutro Abs: 4.6 10*3/uL (ref 1.7–7.7)
Neutrophils Relative %: 16 %
Platelets: 223 10*3/uL (ref 150–400)
RBC: 3.73 MIL/uL — ABNORMAL LOW (ref 3.87–5.11)
RDW: 13.5 % (ref 11.5–15.5)
WBC: 29.6 10*3/uL — ABNORMAL HIGH (ref 4.0–10.5)
nRBC: 0 % (ref 0.0–0.2)

## 2021-02-07 LAB — FERRITIN: Ferritin: 60 ng/mL (ref 11–307)

## 2021-02-07 NOTE — Progress Notes (Signed)
Pt doing well watching what she eats and loosing wt. Few pounds at time from march 2021

## 2021-02-17 DIAGNOSIS — I10 Essential (primary) hypertension: Secondary | ICD-10-CM | POA: Diagnosis not present

## 2021-02-17 DIAGNOSIS — N183 Chronic kidney disease, stage 3 unspecified: Secondary | ICD-10-CM | POA: Diagnosis not present

## 2021-02-17 DIAGNOSIS — E782 Mixed hyperlipidemia: Secondary | ICD-10-CM | POA: Diagnosis not present

## 2021-02-17 DIAGNOSIS — E1165 Type 2 diabetes mellitus with hyperglycemia: Secondary | ICD-10-CM | POA: Diagnosis not present

## 2021-02-17 DIAGNOSIS — E1122 Type 2 diabetes mellitus with diabetic chronic kidney disease: Secondary | ICD-10-CM | POA: Diagnosis not present

## 2021-02-17 DIAGNOSIS — N1831 Chronic kidney disease, stage 3a: Secondary | ICD-10-CM | POA: Diagnosis not present

## 2021-02-17 DIAGNOSIS — M1990 Unspecified osteoarthritis, unspecified site: Secondary | ICD-10-CM | POA: Diagnosis not present

## 2021-03-18 DIAGNOSIS — E782 Mixed hyperlipidemia: Secondary | ICD-10-CM | POA: Diagnosis not present

## 2021-03-18 DIAGNOSIS — E1165 Type 2 diabetes mellitus with hyperglycemia: Secondary | ICD-10-CM | POA: Diagnosis not present

## 2021-03-18 DIAGNOSIS — M1990 Unspecified osteoarthritis, unspecified site: Secondary | ICD-10-CM | POA: Diagnosis not present

## 2021-03-18 DIAGNOSIS — E1122 Type 2 diabetes mellitus with diabetic chronic kidney disease: Secondary | ICD-10-CM | POA: Diagnosis not present

## 2021-03-18 DIAGNOSIS — N1831 Chronic kidney disease, stage 3a: Secondary | ICD-10-CM | POA: Diagnosis not present

## 2021-03-18 DIAGNOSIS — I10 Essential (primary) hypertension: Secondary | ICD-10-CM | POA: Diagnosis not present

## 2021-03-28 DIAGNOSIS — E782 Mixed hyperlipidemia: Secondary | ICD-10-CM | POA: Diagnosis not present

## 2021-03-28 DIAGNOSIS — N1831 Chronic kidney disease, stage 3a: Secondary | ICD-10-CM | POA: Diagnosis not present

## 2021-03-28 DIAGNOSIS — D638 Anemia in other chronic diseases classified elsewhere: Secondary | ICD-10-CM | POA: Diagnosis not present

## 2021-03-28 DIAGNOSIS — Z0000 Encounter for general adult medical examination without abnormal findings: Secondary | ICD-10-CM | POA: Diagnosis not present

## 2021-03-28 DIAGNOSIS — I10 Essential (primary) hypertension: Secondary | ICD-10-CM | POA: Diagnosis not present

## 2021-03-28 DIAGNOSIS — C9110 Chronic lymphocytic leukemia of B-cell type not having achieved remission: Secondary | ICD-10-CM | POA: Diagnosis not present

## 2021-03-28 DIAGNOSIS — J309 Allergic rhinitis, unspecified: Secondary | ICD-10-CM | POA: Diagnosis not present

## 2021-03-28 DIAGNOSIS — M1990 Unspecified osteoarthritis, unspecified site: Secondary | ICD-10-CM | POA: Diagnosis not present

## 2021-03-28 DIAGNOSIS — H3411 Central retinal artery occlusion, right eye: Secondary | ICD-10-CM | POA: Diagnosis not present

## 2021-03-28 DIAGNOSIS — E1122 Type 2 diabetes mellitus with diabetic chronic kidney disease: Secondary | ICD-10-CM | POA: Diagnosis not present

## 2021-04-01 DIAGNOSIS — M5136 Other intervertebral disc degeneration, lumbar region: Secondary | ICD-10-CM | POA: Diagnosis not present

## 2021-04-14 DIAGNOSIS — E782 Mixed hyperlipidemia: Secondary | ICD-10-CM | POA: Diagnosis not present

## 2021-04-14 DIAGNOSIS — E1165 Type 2 diabetes mellitus with hyperglycemia: Secondary | ICD-10-CM | POA: Diagnosis not present

## 2021-04-14 DIAGNOSIS — M1990 Unspecified osteoarthritis, unspecified site: Secondary | ICD-10-CM | POA: Diagnosis not present

## 2021-04-14 DIAGNOSIS — N1831 Chronic kidney disease, stage 3a: Secondary | ICD-10-CM | POA: Diagnosis not present

## 2021-04-14 DIAGNOSIS — N183 Chronic kidney disease, stage 3 unspecified: Secondary | ICD-10-CM | POA: Diagnosis not present

## 2021-04-14 DIAGNOSIS — I10 Essential (primary) hypertension: Secondary | ICD-10-CM | POA: Diagnosis not present

## 2021-04-14 DIAGNOSIS — E1122 Type 2 diabetes mellitus with diabetic chronic kidney disease: Secondary | ICD-10-CM | POA: Diagnosis not present

## 2021-05-19 DIAGNOSIS — M1990 Unspecified osteoarthritis, unspecified site: Secondary | ICD-10-CM | POA: Diagnosis not present

## 2021-05-19 DIAGNOSIS — N1831 Chronic kidney disease, stage 3a: Secondary | ICD-10-CM | POA: Diagnosis not present

## 2021-05-19 DIAGNOSIS — I10 Essential (primary) hypertension: Secondary | ICD-10-CM | POA: Diagnosis not present

## 2021-05-19 DIAGNOSIS — E1165 Type 2 diabetes mellitus with hyperglycemia: Secondary | ICD-10-CM | POA: Diagnosis not present

## 2021-05-19 DIAGNOSIS — E1122 Type 2 diabetes mellitus with diabetic chronic kidney disease: Secondary | ICD-10-CM | POA: Diagnosis not present

## 2021-05-19 DIAGNOSIS — N183 Chronic kidney disease, stage 3 unspecified: Secondary | ICD-10-CM | POA: Diagnosis not present

## 2021-05-19 DIAGNOSIS — E782 Mixed hyperlipidemia: Secondary | ICD-10-CM | POA: Diagnosis not present

## 2021-06-29 DIAGNOSIS — H3411 Central retinal artery occlusion, right eye: Secondary | ICD-10-CM | POA: Diagnosis not present

## 2021-06-29 DIAGNOSIS — H35372 Puckering of macula, left eye: Secondary | ICD-10-CM | POA: Diagnosis not present

## 2021-06-29 DIAGNOSIS — H33311 Horseshoe tear of retina without detachment, right eye: Secondary | ICD-10-CM | POA: Diagnosis not present

## 2021-06-29 DIAGNOSIS — H33321 Round hole, right eye: Secondary | ICD-10-CM | POA: Diagnosis not present

## 2021-07-04 DIAGNOSIS — E1165 Type 2 diabetes mellitus with hyperglycemia: Secondary | ICD-10-CM | POA: Diagnosis not present

## 2021-07-04 DIAGNOSIS — E782 Mixed hyperlipidemia: Secondary | ICD-10-CM | POA: Diagnosis not present

## 2021-07-04 DIAGNOSIS — I10 Essential (primary) hypertension: Secondary | ICD-10-CM | POA: Diagnosis not present

## 2021-07-04 DIAGNOSIS — E1122 Type 2 diabetes mellitus with diabetic chronic kidney disease: Secondary | ICD-10-CM | POA: Diagnosis not present

## 2021-07-04 DIAGNOSIS — N183 Chronic kidney disease, stage 3 unspecified: Secondary | ICD-10-CM | POA: Diagnosis not present

## 2021-07-04 DIAGNOSIS — D638 Anemia in other chronic diseases classified elsewhere: Secondary | ICD-10-CM | POA: Diagnosis not present

## 2021-07-04 DIAGNOSIS — M1990 Unspecified osteoarthritis, unspecified site: Secondary | ICD-10-CM | POA: Diagnosis not present

## 2021-08-04 DIAGNOSIS — N1831 Chronic kidney disease, stage 3a: Secondary | ICD-10-CM | POA: Diagnosis not present

## 2021-08-04 DIAGNOSIS — E1122 Type 2 diabetes mellitus with diabetic chronic kidney disease: Secondary | ICD-10-CM | POA: Diagnosis not present

## 2021-08-04 DIAGNOSIS — M1990 Unspecified osteoarthritis, unspecified site: Secondary | ICD-10-CM | POA: Diagnosis not present

## 2021-08-04 DIAGNOSIS — E1165 Type 2 diabetes mellitus with hyperglycemia: Secondary | ICD-10-CM | POA: Diagnosis not present

## 2021-08-04 DIAGNOSIS — E782 Mixed hyperlipidemia: Secondary | ICD-10-CM | POA: Diagnosis not present

## 2021-08-04 DIAGNOSIS — D638 Anemia in other chronic diseases classified elsewhere: Secondary | ICD-10-CM | POA: Diagnosis not present

## 2021-08-04 DIAGNOSIS — I10 Essential (primary) hypertension: Secondary | ICD-10-CM | POA: Diagnosis not present

## 2021-08-09 ENCOUNTER — Other Ambulatory Visit: Payer: Self-pay | Admitting: *Deleted

## 2021-08-09 DIAGNOSIS — C9110 Chronic lymphocytic leukemia of B-cell type not having achieved remission: Secondary | ICD-10-CM

## 2021-08-09 DIAGNOSIS — D509 Iron deficiency anemia, unspecified: Secondary | ICD-10-CM

## 2021-08-10 ENCOUNTER — Inpatient Hospital Stay: Payer: Medicare Other

## 2021-08-10 ENCOUNTER — Inpatient Hospital Stay: Payer: Medicare Other | Attending: Hematology and Oncology | Admitting: Oncology

## 2021-08-10 ENCOUNTER — Other Ambulatory Visit: Payer: Self-pay

## 2021-08-10 ENCOUNTER — Encounter: Payer: Self-pay | Admitting: Oncology

## 2021-08-10 VITALS — BP 117/59 | HR 76 | Temp 97.6°F | Resp 20 | Wt 247.7 lb

## 2021-08-10 DIAGNOSIS — E1122 Type 2 diabetes mellitus with diabetic chronic kidney disease: Secondary | ICD-10-CM | POA: Diagnosis not present

## 2021-08-10 DIAGNOSIS — N189 Chronic kidney disease, unspecified: Secondary | ICD-10-CM | POA: Diagnosis not present

## 2021-08-10 DIAGNOSIS — C9110 Chronic lymphocytic leukemia of B-cell type not having achieved remission: Secondary | ICD-10-CM | POA: Insufficient documentation

## 2021-08-10 DIAGNOSIS — Z800 Family history of malignant neoplasm of digestive organs: Secondary | ICD-10-CM | POA: Diagnosis not present

## 2021-08-10 DIAGNOSIS — I129 Hypertensive chronic kidney disease with stage 1 through stage 4 chronic kidney disease, or unspecified chronic kidney disease: Secondary | ICD-10-CM | POA: Insufficient documentation

## 2021-08-10 DIAGNOSIS — D509 Iron deficiency anemia, unspecified: Secondary | ICD-10-CM

## 2021-08-10 LAB — CBC WITH DIFFERENTIAL/PLATELET
Abs Immature Granulocytes: 0.07 10*3/uL (ref 0.00–0.07)
Basophils Absolute: 0.1 10*3/uL (ref 0.0–0.1)
Basophils Relative: 0 %
Eosinophils Absolute: 0.2 10*3/uL (ref 0.0–0.5)
Eosinophils Relative: 0 %
HCT: 34.6 % — ABNORMAL LOW (ref 36.0–46.0)
Hemoglobin: 11.5 g/dL — ABNORMAL LOW (ref 12.0–15.0)
Immature Granulocytes: 0 %
Lymphocytes Relative: 85 %
Lymphs Abs: 33 10*3/uL — ABNORMAL HIGH (ref 0.7–4.0)
MCH: 31.1 pg (ref 26.0–34.0)
MCHC: 33.2 g/dL (ref 30.0–36.0)
MCV: 93.5 fL (ref 80.0–100.0)
Monocytes Absolute: 1.2 10*3/uL — ABNORMAL HIGH (ref 0.1–1.0)
Monocytes Relative: 3 %
Neutro Abs: 4.5 10*3/uL (ref 1.7–7.7)
Neutrophils Relative %: 12 %
Platelets: 230 10*3/uL (ref 150–400)
RBC: 3.7 MIL/uL — ABNORMAL LOW (ref 3.87–5.11)
RDW: 14.3 % (ref 11.5–15.5)
WBC: 39 10*3/uL — ABNORMAL HIGH (ref 4.0–10.5)
nRBC: 0 % (ref 0.0–0.2)

## 2021-08-10 LAB — COMPREHENSIVE METABOLIC PANEL
ALT: 26 U/L (ref 0–44)
AST: 21 U/L (ref 15–41)
Albumin: 4.3 g/dL (ref 3.5–5.0)
Alkaline Phosphatase: 88 U/L (ref 38–126)
Anion gap: 7 (ref 5–15)
BUN: 51 mg/dL — ABNORMAL HIGH (ref 8–23)
CO2: 24 mmol/L (ref 22–32)
Calcium: 8.9 mg/dL (ref 8.9–10.3)
Chloride: 105 mmol/L (ref 98–111)
Creatinine, Ser: 1.54 mg/dL — ABNORMAL HIGH (ref 0.44–1.00)
GFR, Estimated: 35 mL/min — ABNORMAL LOW (ref >60)
Glucose, Bld: 214 mg/dL — ABNORMAL HIGH (ref 70–99)
Potassium: 4.5 mmol/L (ref 3.5–5.1)
Sodium: 136 mmol/L (ref 135–145)
Total Bilirubin: 0.8 mg/dL (ref 0.3–1.2)
Total Protein: 7.2 g/dL (ref 6.5–8.1)

## 2021-08-10 LAB — IRON AND TIBC
Iron: 93 ug/dL (ref 28–170)
Saturation Ratios: 28 % (ref 10.4–31.8)
TIBC: 333 ug/dL (ref 250–450)
UIBC: 240 ug/dL

## 2021-08-10 LAB — FERRITIN: Ferritin: 58 ng/mL (ref 11–307)

## 2021-08-11 NOTE — Progress Notes (Signed)
Hematology/Oncology Consult note [facility]  [phone] [phone]  Patient Care Team: Wenda Low, MD as PCP - General (Internal Medicine)   Name of the patient: Tammy [name]  [id number]  03-17-1946   Date of visit: 08/11/21  Diagnosis-Rai stage 0 CLL  Chief complaint/ Reason for visit-routine follow-up of CLL    Heme/Onc history: patient is a 75 year old female diagnosed with Rai stage 0 CLL back in 2010 when she had presented with lymphocytosis.  She has not required any treatment for it so far.  She also has a history of iron deficiency anemia and has received IV iron in the past.  There is also component of chronic kidney disease for which she sees nephrology.  Interval history-patient presently reports doing well overall.  Denies any changes in her appetite or weight.  Denies anyLumps or bumps anywhere.  Denies any drenching night sweats.  ECOG PS- 1 Pain scale- 0   Review of systems- Review of Systems  Constitutional:  Positive for malaise/fatigue. Negative for chills, fever and weight loss.  HENT:  Negative for congestion, ear discharge and nosebleeds.   Eyes:  Negative for blurred vision.  Respiratory:  Negative for cough, hemoptysis, sputum production, shortness of breath and wheezing.   Cardiovascular:  Negative for chest pain, palpitations, orthopnea and claudication.  Gastrointestinal:  Negative for abdominal pain, blood in stool, constipation, diarrhea, heartburn, melena, nausea and vomiting.  Genitourinary:  Negative for dysuria, flank pain, frequency, hematuria and urgency.  Musculoskeletal:  Negative for back pain, joint pain and myalgias.  Skin:  Negative for rash.  Neurological:  Negative for dizziness, tingling, focal weakness, seizures, weakness and headaches.  Endo/Heme/Allergies:  Does not bruise/bleed easily.  Psychiatric/Behavioral:  Negative for depression and suicidal ideas. The patient does not have  insomnia.       Allergies  Allergen Reactions   Latex Rash   Sulfa Antibiotics Rash     Past Medical History:  Diagnosis Date   Allergic rhinitis    Chronic kidney disease    Chronic lymphocytic leukemia ([facility])    CLL (chronic lymphocytic leukemia) ([facility])    DA (degenerative arthritis)    Diabetes mellitus    Headache    Heart murmur    History of kidney stones    Hyperlipidemia    Hypertension    Hypertriglyceridemia    Iron deficiency anemia    Other elevated white blood cell count    Unspecified deficiency anemia      Past Surgical History:  Procedure Laterality Date   ABDOMINAL HYSTERECTOMY     50 years   COLONOSCOPY  2008   most recent   [place]   lask   JOINT REPLACEMENT     JOINT REPLACEMENT     KNEE ARTHROTOMY Left 05/15/2018   Procedure: KNEE ARTHROTOMY,LYSIS OF ADHESIONS, POLY EXCHANGE;  Surgeon: Dereck Leep, MD;  Location: [facility] ORS;  Service: Orthopedics;  Laterality: Left;   OOPHORECTOMY     REFRACTIVE SURGERY Bilateral 1996   REPLACEMENT TOTAL KNEE Right 2015   REPLACEMENT TOTAL KNEE Left 2007    Social History   Socioeconomic History   Marital status: Married    Spouse name: Not on file   Number of children: Not on file   Years of education: Not on file   Highest education level: Not on file  Occupational History   Not on file  Tobacco Use   Smoking status: Never   Smokeless tobacco: Never  Vaping Use   Vaping Use: Never used  Substance and Sexual Activity   Alcohol use: No   Drug use: No   Sexual activity: Not on file  Other Topics Concern   Not on file  Social History Narrative   Not on file   Social Determinants of Health   Financial Resource Strain: Not on file  Food Insecurity: Not on file  Transportation Needs: Not on file  Physical Activity: Not on file  Stress: Not on file  Social Connections: Not on file  Intimate Partner Violence: Not on file    Family History  Problem Relation Age of Onset    Cancer Father        gastric cancer   Cancer Sister        gastric cancer   Diabetes Mother    Hypertension Mother    Diabetes Brother    Diabetes Brother    Diabetes Sister    Breast cancer Neg Hx      Current Outpatient Medications:    amLODipine (NORVASC) 5 MG tablet, Take 5 mg by mouth every evening. , Disp: , Rfl: 11   aspirin EC 81 MG tablet, Take 81 mg by mouth daily., Disp: , Rfl:    benazepril-hydrochlorthiazide (LOTENSIN HCT) 20-12.5 MG per tablet, Take 1 tablet by mouth daily. , Disp: , Rfl:    Cholecalciferol 5000 units capsule, Take 5,000 Units by mouth daily. , Disp: , Rfl:    Continuous Blood Gluc Sensor (FREESTYLE LIBRE 14 DAY SENSOR) MISC, Apply topically., Disp: , Rfl:    dapagliflozin propanediol (FARXIGA) 10 MG TABS tablet, 1 tablet, Disp: , Rfl:    ferrous sulfate 325 (65 FE) MG tablet, Take 325 mg by mouth daily with breakfast., Disp: , Rfl:    glimepiride (AMARYL) 2 MG tablet, Take 2 mg by mouth in the morning and at bedtime., Disp: , Rfl:    insulin degludec (TRESIBA) 100 UNIT/ML FlexTouch Pen, Inject 16 Units into the skin daily., Disp: , Rfl:    meloxicam (MOBIC) 15 MG tablet, meloxicam 15 mg tablet  TAKE 1 TABLET BY MOUTH EVERY DAY, Disp: , Rfl:    methocarbamol (ROBAXIN) 500 MG tablet, methocarbamol 500 mg tablet  TAKE 1 TABLET BY MOUTH TWICE A DAY, Disp: , Rfl:    Multiple Vitamins-Minerals (CEROVITE SENIOR) TABS, Take 1 tablet by mouth daily., Disp: , Rfl:    simvastatin (ZOCOR) 10 MG tablet, Take 10 mg by mouth at bedtime. , Disp: , Rfl: 3   ACCU-CHEK GUIDE test strip, USE AS DIRECTED 3 TO 4 TIMES PER DAY, Disp: , Rfl:    acetaminophen (TYLENOL) 500 MG tablet, Take 1,000 mg by mouth every 6 (six) hours as needed for moderate pain or headache. (Patient not taking: Reported on 08/10/2021), Disp: , Rfl:    acyclovir (ZOVIRAX) 400 MG tablet, Take 400 mg by mouth 3 (three) times daily as needed. (Patient not taking: Reported on 08/10/2021), Disp: , Rfl:     albuterol (PROVENTIL HFA;VENTOLIN HFA) 108 (90 Base) MCG/ACT inhaler, TAKE 2 PUFFS BY MOUTH EVERY 4 HOURS AS NEEDED FOR COUGHING OR WHEEZING, Disp: , Rfl:    amoxicillin (AMOXIL) 500 MG capsule, TAKE 4 CAPSULES BY MOUTH 1 HOUR PRIOR TO DENTAL APPOINTMENT (Patient not taking: No sig reported), Disp: , Rfl: 0   BD PEN NEEDLE NANO 2ND GEN 32G X 4 MM MISC, SMARTSIG:Pre-Filled Pen Syringe SUB-Q Daily, Disp: , Rfl:    fluticasone (FLONASE) 50 MCG/ACT nasal spray, Place 2 sprays into  both nostrils daily as needed. (Patient not taking: Reported on 08/10/2021), Disp: , Rfl:    meclizine (ANTIVERT) 25 MG tablet, Take 25 mg by mouth 3 (three) times daily as needed for dizziness. (Patient not taking: No sig reported), Disp: , Rfl:    sitaGLIPtin (JANUVIA) 50 MG tablet, Take 50 mg by mouth daily.  (Patient not taking: Reported on 08/10/2021), Disp: , Rfl:   Physical exam:  Vitals:   08/10/21 1308  BP: (!) 117/59  Pulse: 76  Resp: 20  Temp: 97.6 F (36.4 C)  SpO2: 98%  Weight: 247 lb 11 oz (112.4 kg)   Physical Exam Constitutional:      General: She is not in acute distress. Cardiovascular:     Rate and Rhythm: Normal rate and regular rhythm.     Heart sounds: Normal heart sounds.  Pulmonary:     Effort: Pulmonary effort is normal.     Breath sounds: Normal breath sounds.  Abdominal:     General: Bowel sounds are normal.     Palpations: Abdomen is soft.     Comments: No palpable hepatosplenomegaly  Lymphadenopathy:     Comments: No palpable cervical, supraclavicular, axillary or inguinal adenopathy    Skin:    General: Skin is warm and dry.  Neurological:     Mental Status: She is alert and oriented to person, place, and time.     CMP Latest Ref Rng & Units 08/10/2021  Glucose 70 - 99 mg/dL 214(H)  BUN 8 - 23 mg/dL 51(H)  Creatinine 0.44 - 1.00 mg/dL 1.54(H)  Sodium 135 - 145 mmol/L 136  Potassium 3.5 - 5.1 mmol/L 4.5  Chloride 98 - 111 mmol/L 105  CO2 22 - 32 mmol/L 24  Calcium 8.9 -  10.3 mg/dL 8.9  Total Protein 6.5 - 8.1 g/dL 7.2  Total Bilirubin 0.3 - 1.2 mg/dL 0.8  Alkaline Phos 38 - 126 U/L 88  AST 15 - 41 U/L 21  ALT 0 - 44 U/L 26   CBC Latest Ref Rng & Units 08/10/2021  WBC 4.0 - 10.5 K/uL 39.0(H)  Hemoglobin 12.0 - 15.0 g/dL 11.5(L)  Hematocrit 36.0 - 46.0 % 34.6(L)  Platelets 150 - 400 K/uL 230      Assessment and plan- Patient is a 75 y.o. female with Rai stage 0 CLL here for routine follow-up  Clinically patient is doing well with no concerning signs and symptoms of recurrence based on today's exam.  No palpable bulky adenopathy or splenomegaly.  No significant B symptoms.Her hemoglobin has remained stable around 11 with a normal platelet count.  As far as her white count goes she had a white count in the 30s even back in 2014 it did gradually come down to the teens in 2015 and more recently has been stable between 60s to 89s.  Her CLL does not require any treatment at this time and can continue to be monitored.  We will repeat CBC with differential in 6 months in 1 year and see her back in 1 year.  She did have her iron studies checked recently as well which does not show any evidence of iron deficiency and we will repeat that in 6 months in 1 year as well.   Visit Diagnosis 1. CLL (chronic lymphocytic leukemia) ([place])   2. Iron deficiency anemia, unspecified iron deficiency anemia type      Dr. Randa Evens, MD, MPH [facility] at [facility] [phone] 08/11/2021 2:26 PM

## 2021-08-16 DIAGNOSIS — E113293 Type 2 diabetes mellitus with mild nonproliferative diabetic retinopathy without macular edema, bilateral: Secondary | ICD-10-CM | POA: Diagnosis not present

## 2021-08-25 DIAGNOSIS — M1990 Unspecified osteoarthritis, unspecified site: Secondary | ICD-10-CM | POA: Diagnosis not present

## 2021-08-25 DIAGNOSIS — E1165 Type 2 diabetes mellitus with hyperglycemia: Secondary | ICD-10-CM | POA: Diagnosis not present

## 2021-08-25 DIAGNOSIS — E1122 Type 2 diabetes mellitus with diabetic chronic kidney disease: Secondary | ICD-10-CM | POA: Diagnosis not present

## 2021-08-25 DIAGNOSIS — I10 Essential (primary) hypertension: Secondary | ICD-10-CM | POA: Diagnosis not present

## 2021-08-25 DIAGNOSIS — E782 Mixed hyperlipidemia: Secondary | ICD-10-CM | POA: Diagnosis not present

## 2021-08-25 DIAGNOSIS — D638 Anemia in other chronic diseases classified elsewhere: Secondary | ICD-10-CM | POA: Diagnosis not present

## 2021-08-25 DIAGNOSIS — N183 Chronic kidney disease, stage 3 unspecified: Secondary | ICD-10-CM | POA: Diagnosis not present

## 2021-08-30 ENCOUNTER — Other Ambulatory Visit: Payer: Self-pay | Admitting: Critical Care Medicine

## 2021-08-30 DIAGNOSIS — Z1231 Encounter for screening mammogram for malignant neoplasm of breast: Secondary | ICD-10-CM

## 2021-10-04 DIAGNOSIS — I10 Essential (primary) hypertension: Secondary | ICD-10-CM | POA: Diagnosis not present

## 2021-10-04 DIAGNOSIS — E1122 Type 2 diabetes mellitus with diabetic chronic kidney disease: Secondary | ICD-10-CM | POA: Diagnosis not present

## 2021-10-04 DIAGNOSIS — N1831 Chronic kidney disease, stage 3a: Secondary | ICD-10-CM | POA: Diagnosis not present

## 2021-10-04 DIAGNOSIS — C9110 Chronic lymphocytic leukemia of B-cell type not having achieved remission: Secondary | ICD-10-CM | POA: Diagnosis not present

## 2021-10-04 DIAGNOSIS — E782 Mixed hyperlipidemia: Secondary | ICD-10-CM | POA: Diagnosis not present

## 2021-10-04 DIAGNOSIS — Z7984 Long term (current) use of oral hypoglycemic drugs: Secondary | ICD-10-CM | POA: Diagnosis not present

## 2021-10-04 DIAGNOSIS — M1990 Unspecified osteoarthritis, unspecified site: Secondary | ICD-10-CM | POA: Diagnosis not present

## 2021-10-04 DIAGNOSIS — H3411 Central retinal artery occlusion, right eye: Secondary | ICD-10-CM | POA: Diagnosis not present

## 2021-10-18 DIAGNOSIS — D638 Anemia in other chronic diseases classified elsewhere: Secondary | ICD-10-CM | POA: Diagnosis not present

## 2021-10-18 DIAGNOSIS — E1122 Type 2 diabetes mellitus with diabetic chronic kidney disease: Secondary | ICD-10-CM | POA: Diagnosis not present

## 2021-10-18 DIAGNOSIS — I10 Essential (primary) hypertension: Secondary | ICD-10-CM | POA: Diagnosis not present

## 2021-10-18 DIAGNOSIS — N1831 Chronic kidney disease, stage 3a: Secondary | ICD-10-CM | POA: Diagnosis not present

## 2021-10-18 DIAGNOSIS — E782 Mixed hyperlipidemia: Secondary | ICD-10-CM | POA: Diagnosis not present

## 2021-10-18 DIAGNOSIS — E1165 Type 2 diabetes mellitus with hyperglycemia: Secondary | ICD-10-CM | POA: Diagnosis not present

## 2021-10-18 DIAGNOSIS — M1990 Unspecified osteoarthritis, unspecified site: Secondary | ICD-10-CM | POA: Diagnosis not present

## 2021-10-24 DIAGNOSIS — N1831 Chronic kidney disease, stage 3a: Secondary | ICD-10-CM | POA: Diagnosis not present

## 2021-11-01 ENCOUNTER — Ambulatory Visit
Admission: RE | Admit: 2021-11-01 | Discharge: 2021-11-01 | Disposition: A | Discharge status: Home / Self Care | Payer: Medicare Other | Source: Ambulatory Visit | Attending: Critical Care Medicine | Admitting: Critical Care Medicine

## 2021-11-01 ENCOUNTER — Other Ambulatory Visit: Payer: Self-pay

## 2021-11-01 DIAGNOSIS — Z1231 Encounter for screening mammogram for malignant neoplasm of breast: Secondary | ICD-10-CM | POA: Insufficient documentation

## 2021-12-12 DIAGNOSIS — E1165 Type 2 diabetes mellitus with hyperglycemia: Secondary | ICD-10-CM | POA: Diagnosis not present

## 2021-12-12 DIAGNOSIS — I10 Essential (primary) hypertension: Secondary | ICD-10-CM | POA: Diagnosis not present

## 2021-12-16 DIAGNOSIS — M1990 Unspecified osteoarthritis, unspecified site: Secondary | ICD-10-CM | POA: Diagnosis not present

## 2021-12-16 DIAGNOSIS — E782 Mixed hyperlipidemia: Secondary | ICD-10-CM | POA: Diagnosis not present

## 2021-12-16 DIAGNOSIS — E1122 Type 2 diabetes mellitus with diabetic chronic kidney disease: Secondary | ICD-10-CM | POA: Diagnosis not present

## 2021-12-16 DIAGNOSIS — I10 Essential (primary) hypertension: Secondary | ICD-10-CM | POA: Diagnosis not present

## 2021-12-16 DIAGNOSIS — E1165 Type 2 diabetes mellitus with hyperglycemia: Secondary | ICD-10-CM | POA: Diagnosis not present

## 2021-12-16 DIAGNOSIS — N1831 Chronic kidney disease, stage 3a: Secondary | ICD-10-CM | POA: Diagnosis not present

## 2022-01-09 DIAGNOSIS — C9110 Chronic lymphocytic leukemia of B-cell type not having achieved remission: Secondary | ICD-10-CM | POA: Diagnosis not present

## 2022-01-09 DIAGNOSIS — I10 Essential (primary) hypertension: Secondary | ICD-10-CM | POA: Diagnosis not present

## 2022-01-09 DIAGNOSIS — N1831 Chronic kidney disease, stage 3a: Secondary | ICD-10-CM | POA: Diagnosis not present

## 2022-01-09 DIAGNOSIS — E1122 Type 2 diabetes mellitus with diabetic chronic kidney disease: Secondary | ICD-10-CM | POA: Diagnosis not present

## 2022-01-31 ENCOUNTER — Other Ambulatory Visit: Payer: Self-pay | Admitting: *Deleted

## 2022-01-31 DIAGNOSIS — D509 Iron deficiency anemia, unspecified: Secondary | ICD-10-CM

## 2022-02-07 ENCOUNTER — Other Ambulatory Visit: Payer: Self-pay

## 2022-02-07 ENCOUNTER — Inpatient Hospital Stay: Payer: Medicare Other | Attending: Oncology

## 2022-02-07 ENCOUNTER — Other Ambulatory Visit: Payer: Medicare Other

## 2022-02-07 DIAGNOSIS — C9110 Chronic lymphocytic leukemia of B-cell type not having achieved remission: Secondary | ICD-10-CM | POA: Diagnosis not present

## 2022-02-07 DIAGNOSIS — D509 Iron deficiency anemia, unspecified: Secondary | ICD-10-CM | POA: Diagnosis not present

## 2022-02-07 LAB — CBC WITH DIFFERENTIAL/PLATELET
Abs Immature Granulocytes: 0.07 10*3/uL (ref 0.00–0.07)
Basophils Absolute: 0 10*3/uL (ref 0.0–0.1)
Basophils Relative: 0 %
Eosinophils Absolute: 0.1 10*3/uL (ref 0.0–0.5)
Eosinophils Relative: 0 %
HCT: 36.7 % (ref 36.0–46.0)
Hemoglobin: 11.6 g/dL — ABNORMAL LOW (ref 12.0–15.0)
Immature Granulocytes: 0 %
Lymphocytes Relative: 88 %
Lymphs Abs: 34.4 10*3/uL — ABNORMAL HIGH (ref 0.7–4.0)
MCH: 30.6 pg (ref 26.0–34.0)
MCHC: 31.6 g/dL (ref 30.0–36.0)
MCV: 96.8 fL (ref 80.0–100.0)
Monocytes Absolute: 0.4 10*3/uL (ref 0.1–1.0)
Monocytes Relative: 1 %
Neutro Abs: 4.3 10*3/uL (ref 1.7–7.7)
Neutrophils Relative %: 11 %
Platelets: 210 10*3/uL (ref 150–400)
RBC: 3.79 MIL/uL — ABNORMAL LOW (ref 3.87–5.11)
RDW: 13.7 % (ref 11.5–15.5)
WBC: 39.4 10*3/uL — ABNORMAL HIGH (ref 4.0–10.5)
nRBC: 0 % (ref 0.0–0.2)

## 2022-02-07 LAB — FERRITIN: Ferritin: 51 ng/mL (ref 11–307)

## 2022-02-07 LAB — IRON AND TIBC
Iron: 91 ug/dL (ref 28–170)
Saturation Ratios: 27 % (ref 10.4–31.8)
TIBC: 335 ug/dL (ref 250–450)
UIBC: 244 ug/dL

## 2022-03-30 DIAGNOSIS — I10 Essential (primary) hypertension: Secondary | ICD-10-CM | POA: Diagnosis not present

## 2022-03-30 DIAGNOSIS — Z0000 Encounter for general adult medical examination without abnormal findings: Secondary | ICD-10-CM | POA: Diagnosis not present

## 2022-03-30 DIAGNOSIS — H3411 Central retinal artery occlusion, right eye: Secondary | ICD-10-CM | POA: Diagnosis not present

## 2022-03-30 DIAGNOSIS — E1122 Type 2 diabetes mellitus with diabetic chronic kidney disease: Secondary | ICD-10-CM | POA: Diagnosis not present

## 2022-03-30 DIAGNOSIS — E782 Mixed hyperlipidemia: Secondary | ICD-10-CM | POA: Diagnosis not present

## 2022-03-30 DIAGNOSIS — N1831 Chronic kidney disease, stage 3a: Secondary | ICD-10-CM | POA: Diagnosis not present

## 2022-03-30 DIAGNOSIS — D638 Anemia in other chronic diseases classified elsewhere: Secondary | ICD-10-CM | POA: Diagnosis not present

## 2022-03-30 DIAGNOSIS — Z1331 Encounter for screening for depression: Secondary | ICD-10-CM | POA: Diagnosis not present

## 2022-03-30 DIAGNOSIS — C9110 Chronic lymphocytic leukemia of B-cell type not having achieved remission: Secondary | ICD-10-CM | POA: Diagnosis not present

## 2022-04-19 DIAGNOSIS — N1831 Chronic kidney disease, stage 3a: Secondary | ICD-10-CM | POA: Diagnosis not present

## 2022-04-19 DIAGNOSIS — E1122 Type 2 diabetes mellitus with diabetic chronic kidney disease: Secondary | ICD-10-CM | POA: Diagnosis not present

## 2022-04-19 DIAGNOSIS — E782 Mixed hyperlipidemia: Secondary | ICD-10-CM | POA: Diagnosis not present

## 2022-04-19 DIAGNOSIS — I10 Essential (primary) hypertension: Secondary | ICD-10-CM | POA: Diagnosis not present

## 2022-05-03 DIAGNOSIS — H3411 Central retinal artery occlusion, right eye: Secondary | ICD-10-CM | POA: Diagnosis not present

## 2022-05-03 DIAGNOSIS — H524 Presbyopia: Secondary | ICD-10-CM | POA: Diagnosis not present

## 2022-05-03 DIAGNOSIS — E083299 Diabetes mellitus due to underlying condition with mild nonproliferative diabetic retinopathy without macular edema, unspecified eye: Secondary | ICD-10-CM | POA: Diagnosis not present

## 2022-05-03 DIAGNOSIS — H2513 Age-related nuclear cataract, bilateral: Secondary | ICD-10-CM | POA: Diagnosis not present

## 2022-05-03 DIAGNOSIS — H33321 Round hole, right eye: Secondary | ICD-10-CM | POA: Diagnosis not present

## 2022-05-03 DIAGNOSIS — H35372 Puckering of macula, left eye: Secondary | ICD-10-CM | POA: Diagnosis not present

## 2022-05-18 DIAGNOSIS — L720 Epidermal cyst: Secondary | ICD-10-CM | POA: Diagnosis not present

## 2022-06-26 ENCOUNTER — Other Ambulatory Visit: Payer: Self-pay

## 2022-06-26 ENCOUNTER — Emergency Department
Admission: EM | Admit: 2022-06-26 | Discharge: 2022-06-26 | Disposition: A | Discharge status: Home / Self Care | Payer: Medicare HMO | Attending: Emergency Medicine | Admitting: Emergency Medicine

## 2022-06-26 ENCOUNTER — Emergency Department: Payer: Medicare HMO

## 2022-06-26 DIAGNOSIS — M25551 Pain in right hip: Secondary | ICD-10-CM | POA: Insufficient documentation

## 2022-06-26 DIAGNOSIS — E119 Type 2 diabetes mellitus without complications: Secondary | ICD-10-CM | POA: Insufficient documentation

## 2022-06-26 DIAGNOSIS — I10 Essential (primary) hypertension: Secondary | ICD-10-CM | POA: Diagnosis not present

## 2022-06-26 MED ORDER — HYDROCODONE-ACETAMINOPHEN 5-325 MG PO TABS: 1.0000 | ORAL_TABLET | ORAL | 0 refills | Status: AC | PRN

## 2022-06-26 NOTE — ED Provider Notes (Signed)
   [facility] Provider Note    Event Date/Time   First MD Initiated Contact with Patient 06/26/22 1015     (approximate)  History   Chief Complaint: Hip Pain  HPI  Tammy [name] is a 76 y.o. female with a past medical history of diabetes, hypertension, hyperlipidemia presents to the emergency department for right hip pain.  According to the patient for the past several days she has been experiencing significant right hip pain ever since rolling over in bed.  Patient denies any trauma or falls.  Patient states she thought the hip was getting better however last night it began worsening once again so the patient came to the emergency department for evaluation.  Physical Exam   Triage Vital Signs: ED Triage Vitals  Enc Vitals Group     BP 06/26/22 0943 (!) 156/66     Pulse Rate 06/26/22 0943 70     Resp 06/26/22 0943 18     Temp 06/26/22 0943 98 F (36.7 C)     Temp Source 06/26/22 0943 Oral     SpO2 06/26/22 0943 98 %     Weight 06/26/22 0944 248 lb (112.5 kg)     Height 06/26/22 0944 '5\' 9"'$  (1.753 m)     Head Circumference --      Peak Flow --      Pain Score 06/26/22 0944 10     Pain Loc --      Pain Edu? --      Excl. in [place]? --     Most recent vital signs: Vitals:   06/26/22 0943  BP: (!) 156/66  Pulse: 70  Resp: 18  Temp: 98 F (36.7 C)  SpO2: 98%    General: Awake, no distress.  CV:  Good peripheral perfusion.  Regular rate and rhythm  Resp:  Normal effort.  Equal breath sounds bilaterally.  Abd:  No distention.  Soft, nontender.  No rebound or guarding. Other:  Mild tenderness to palpation of the right hip.  Mild tenderness with internal rotation of the right hip as well.  Neurovascular intact distally.   ED Results / Procedures / Treatments    RADIOLOGY  I personally reviewed and interpreted the x-ray images I do not see any obvious fracture on my evaluation. Radiology has read the x-rays negative for fracture does show signs  of osteoarthritis.   MEDICATIONS ORDERED IN ED: Medications - No data to display   IMPRESSION / MDM / [place] / ED COURSE  I reviewed the triage vital signs and the nursing notes.  Patient's presentation is most consistent with acute presentation with potential threat to life or bodily function.  Patient presents emergency department for cute onset of pain in the right hip 3 days ago has had intermittent pain ever since but appears to be worsening now once again per patient.  Patient denies any falls or obvious trauma states it began hurting when she rolled over in bed.  X-ray is negative for acute fracture however given the patient's discomfort with tenderness to palpation internal rotation and ambulation we will obtain CT imaging of the hip to rule out incomplete fracture/hairline fracture.  Patient agreeable to plan.  CT scan negative for fracture  FINAL CLINICAL IMPRESSION(S) / ED DIAGNOSES   Right hip pain   Note:  This document was prepared using Dragon voice recognition software and may include unintentional dictation errors.   Harvest Dark, MD 06/26/22 1154

## 2022-06-26 NOTE — ED Triage Notes (Signed)
Pt to ED via [facility] from home. Pt reports right hip pain that has been getting worse x3 days. Pt reports took meloxicam yesterday with relief. Pt denies injury to hip.   Pt with hx HtN, DM and arthritis.

## 2022-06-28 DIAGNOSIS — M7061 Trochanteric bursitis, right hip: Secondary | ICD-10-CM | POA: Diagnosis not present

## 2022-07-03 DIAGNOSIS — E1165 Type 2 diabetes mellitus with hyperglycemia: Secondary | ICD-10-CM | POA: Diagnosis not present

## 2022-07-12 ENCOUNTER — Other Ambulatory Visit: Payer: Medicare Other

## 2022-07-12 ENCOUNTER — Ambulatory Visit: Payer: Medicare Other | Admitting: Oncology

## 2022-07-18 ENCOUNTER — Encounter: Payer: Self-pay | Admitting: Oncology

## 2022-07-18 ENCOUNTER — Inpatient Hospital Stay: Payer: Medicare HMO | Attending: Oncology

## 2022-07-18 ENCOUNTER — Inpatient Hospital Stay: Payer: Medicare HMO | Admitting: Oncology

## 2022-07-18 VITALS — BP 157/70 | HR 65 | Temp 97.5°F | Ht 69.0 in | Wt 259.4 lb

## 2022-07-18 DIAGNOSIS — C9110 Chronic lymphocytic leukemia of B-cell type not having achieved remission: Secondary | ICD-10-CM | POA: Insufficient documentation

## 2022-07-18 DIAGNOSIS — E1122 Type 2 diabetes mellitus with diabetic chronic kidney disease: Secondary | ICD-10-CM | POA: Diagnosis not present

## 2022-07-18 DIAGNOSIS — D509 Iron deficiency anemia, unspecified: Secondary | ICD-10-CM | POA: Insufficient documentation

## 2022-07-18 DIAGNOSIS — I129 Hypertensive chronic kidney disease with stage 1 through stage 4 chronic kidney disease, or unspecified chronic kidney disease: Secondary | ICD-10-CM | POA: Diagnosis not present

## 2022-07-18 DIAGNOSIS — Z90710 Acquired absence of both cervix and uterus: Secondary | ICD-10-CM | POA: Diagnosis not present

## 2022-07-18 DIAGNOSIS — Z800 Family history of malignant neoplasm of digestive organs: Secondary | ICD-10-CM | POA: Diagnosis not present

## 2022-07-18 DIAGNOSIS — N189 Chronic kidney disease, unspecified: Secondary | ICD-10-CM | POA: Diagnosis not present

## 2022-07-18 LAB — CBC WITH DIFFERENTIAL/PLATELET
Abs Immature Granulocytes: 0.06 10*3/uL (ref 0.00–0.07)
Basophils Absolute: 0.1 10*3/uL (ref 0.0–0.1)
Basophils Relative: 0 %
Eosinophils Absolute: 0.3 10*3/uL (ref 0.0–0.5)
Eosinophils Relative: 1 %
HCT: 37 % (ref 36.0–46.0)
Hemoglobin: 11.5 g/dL — ABNORMAL LOW (ref 12.0–15.0)
Immature Granulocytes: 0 %
Lymphocytes Relative: 88 %
Lymphs Abs: 32.8 10*3/uL — ABNORMAL HIGH (ref 0.7–4.0)
MCH: 30.4 pg (ref 26.0–34.0)
MCHC: 31.1 g/dL (ref 30.0–36.0)
MCV: 97.9 fL (ref 80.0–100.0)
Monocytes Absolute: 0.4 10*3/uL (ref 0.1–1.0)
Monocytes Relative: 1 %
Neutro Abs: 3.9 10*3/uL (ref 1.7–7.7)
Neutrophils Relative %: 10 %
Platelets: 205 10*3/uL (ref 150–400)
RBC: 3.78 MIL/uL — ABNORMAL LOW (ref 3.87–5.11)
RDW: 14.4 % (ref 11.5–15.5)
WBC: 37.5 10*3/uL — ABNORMAL HIGH (ref 4.0–10.5)
nRBC: 0.1 % (ref 0.0–0.2)

## 2022-07-18 LAB — IRON AND TIBC
Iron: 73 ug/dL (ref 28–170)
Saturation Ratios: 23 % (ref 10.4–31.8)
TIBC: 323 ug/dL (ref 250–450)
UIBC: 250 ug/dL

## 2022-07-18 LAB — FERRITIN: Ferritin: 43 ng/mL (ref 11–307)

## 2022-07-18 NOTE — Progress Notes (Signed)
Hematology/Oncology Consult note [facility]  Telephone:([phone] Fax:([phone]  Patient Care Team: Wenda Low, MD as PCP - General (Internal Medicine) Sindy Guadeloupe, MD as Consulting Physician (Oncology)   Name of the patient: Tammy [name]  [id number]  19-Feb-1946   Date of visit: 07/18/22  Diagnosis-stage 0 CLL  Chief complaint/ Reason for visit-routine follow-up of CLL  Heme/Onc history: patient is a 76 year old female diagnosed with Rai stage 0 CLL back in 2010 when she had presented with lymphocytosis.  She has not required any treatment for it so far.  She also has a history of iron deficiency anemia and has received IV iron in the past.  There is also component of chronic kidney disease for which she sees nephrology.  Interval history-patient reports doing well and denies any specific complaints at this time.  Denies any significant fatigue, unintentional weight loss or drenching night sweats.  ECOG PS- 1 Pain scale- 0   Review of systems- Review of Systems  Constitutional:  Negative for chills, fever, malaise/fatigue and weight loss.  HENT:  Negative for congestion, ear discharge and nosebleeds.   Eyes:  Negative for blurred vision.  Respiratory:  Negative for cough, hemoptysis, sputum production, shortness of breath and wheezing.   Cardiovascular:  Negative for chest pain, palpitations, orthopnea and claudication.  Gastrointestinal:  Negative for abdominal pain, blood in stool, constipation, diarrhea, heartburn, melena, nausea and vomiting.  Genitourinary:  Negative for dysuria, flank pain, frequency, hematuria and urgency.  Musculoskeletal:  Negative for back pain, joint pain and myalgias.  Skin:  Negative for rash.  Neurological:  Negative for dizziness, tingling, focal weakness, seizures, weakness and headaches.  Endo/Heme/Allergies:  Does not bruise/bleed easily.  Psychiatric/Behavioral:  Negative for depression and suicidal  ideas. The patient does not have insomnia.       Allergies  Allergen Reactions   Latex Rash   Sulfa Antibiotics Rash     Past Medical History:  Diagnosis Date   Allergic rhinitis    Chronic kidney disease    Chronic lymphocytic leukemia (HCC)    CLL (chronic lymphocytic leukemia) (HCC)    DA (degenerative arthritis)    Diabetes mellitus    Headache    Heart murmur    History of kidney stones    Hyperlipidemia    Hypertension    Hypertriglyceridemia    Iron deficiency anemia    Other elevated white blood cell count    Unspecified deficiency anemia      Past Surgical History:  Procedure Laterality Date   ABDOMINAL HYSTERECTOMY     50 years   COLONOSCOPY  2008   most recent   [place]   lask   JOINT REPLACEMENT     JOINT REPLACEMENT     KNEE ARTHROTOMY Left 05/15/2018   Procedure: KNEE ARTHROTOMY,LYSIS OF ADHESIONS, POLY EXCHANGE;  Surgeon: Dereck Leep, MD;  Location: [facility] ORS;  Service: Orthopedics;  Laterality: Left;   OOPHORECTOMY     REFRACTIVE SURGERY Bilateral 1996   REPLACEMENT TOTAL KNEE Right 2015   REPLACEMENT TOTAL KNEE Left 2007    Social History   Socioeconomic History   Marital status: Married    Spouse name: Not on file   Number of children: Not on file   Years of education: Not on file   Highest education level: Not on file  Occupational History   Not on file  Tobacco Use   Smoking status: Never   Smokeless tobacco: Never  Vaping Use   Vaping Use: Never used  Substance and Sexual Activity   Alcohol use: No   Drug use: No   Sexual activity: Not on file  Other Topics Concern   Not on file  Social History Narrative   Not on file   Social Determinants of Health   Financial Resource Strain: Not on file  Food Insecurity: Not on file  Transportation Needs: Not on file  Physical Activity: Not on file  Stress: Not on file  Social Connections: Not on file  Intimate Partner Violence: Not on file    Family History   Problem Relation Age of Onset   Cancer Father        gastric cancer   Cancer Sister        gastric cancer   Diabetes Mother    Hypertension Mother    Diabetes Brother    Diabetes Brother    Diabetes Sister    Breast cancer Neg Hx      Current Outpatient Medications:    ACCU-CHEK GUIDE test strip, USE AS DIRECTED 3 TO 4 TIMES PER DAY, Disp: , Rfl:    acetaminophen (TYLENOL) 500 MG tablet, Take 1,000 mg by mouth every 6 (six) hours as needed for moderate pain or headache., Disp: , Rfl:    amLODipine (NORVASC) 5 MG tablet, Take 5 mg by mouth every evening. , Disp: , Rfl: 11   aspirin EC 81 MG tablet, Take 81 mg by mouth daily., Disp: , Rfl:    BD PEN NEEDLE NANO 2ND GEN 32G X 4 MM MISC, SMARTSIG:Pre-Filled Pen Syringe SUB-Q Daily, Disp: , Rfl:    benazepril-hydrochlorthiazide (LOTENSIN HCT) 20-12.5 MG per tablet, Take 1 tablet by mouth daily. , Disp: , Rfl:    Cholecalciferol 5000 units capsule, Take 5,000 Units by mouth daily. , Disp: , Rfl:    Continuous Blood Gluc Sensor (FREESTYLE LIBRE 14 DAY SENSOR) MISC, Apply topically., Disp: , Rfl:    dapagliflozin propanediol (FARXIGA) 10 MG TABS tablet, 1 tablet, Disp: , Rfl:    ferrous sulfate 325 (65 FE) MG tablet, Take 325 mg by mouth daily with breakfast., Disp: , Rfl:    glimepiride (AMARYL) 2 MG tablet, Take 2 mg by mouth in the morning and at bedtime., Disp: , Rfl:    HYDROcodone-acetaminophen (NORCO/VICODIN) 5-325 MG tablet, Take 1 tablet by mouth every 4 (four) hours as needed., Disp: 12 tablet, Rfl: 0   insulin degludec (TRESIBA) 100 UNIT/ML FlexTouch Pen, Inject 16 Units into the skin daily., Disp: , Rfl:    meclizine (ANTIVERT) 25 MG tablet, Take 25 mg by mouth 3 (three) times daily as needed for dizziness., Disp: , Rfl:    methocarbamol (ROBAXIN) 500 MG tablet, methocarbamol 500 mg tablet  TAKE 1 TABLET BY MOUTH TWICE A DAY, Disp: , Rfl:    Multiple Vitamins-Minerals (CEROVITE SENIOR) TABS, Take 1 tablet by mouth daily., Disp: ,  Rfl:    simvastatin (ZOCOR) 10 MG tablet, Take 10 mg by mouth at bedtime. , Disp: , Rfl: 3   acyclovir (ZOVIRAX) 400 MG tablet, Take 400 mg by mouth 3 (three) times daily as needed. (Patient not taking: Reported on 08/10/2021), Disp: , Rfl:    albuterol (PROVENTIL HFA;VENTOLIN HFA) 108 (90 Base) MCG/ACT inhaler, TAKE 2 PUFFS BY MOUTH EVERY 4 HOURS AS NEEDED FOR COUGHING OR WHEEZING, Disp: , Rfl:    amoxicillin (AMOXIL) 500 MG capsule, TAKE 4 CAPSULES BY MOUTH 1 HOUR PRIOR TO DENTAL APPOINTMENT (Patient not taking: No  sig reported), Disp: , Rfl: 0   fluticasone (FLONASE) 50 MCG/ACT nasal spray, Place 2 sprays into both nostrils daily as needed. (Patient not taking: Reported on 08/10/2021), Disp: , Rfl:    meloxicam (MOBIC) 15 MG tablet, meloxicam 15 mg tablet  TAKE 1 TABLET BY MOUTH EVERY DAY (Patient not taking: Reported on 07/18/2022), Disp: , Rfl:    sitaGLIPtin (JANUVIA) 50 MG tablet, Take 50 mg by mouth daily.  (Patient not taking: Reported on 08/10/2021), Disp: , Rfl:    TRULICITY 5.80 DX/8.3JA SOPN, Inject 1.5 mg into the skin., Disp: , Rfl:   Physical exam:  Vitals:   07/18/22 0925  BP: (!) 157/70  Pulse: 65  Temp: (!) 97.5 F (36.4 C)  TempSrc: Tympanic  Weight: 259 lb 6.4 oz (117.7 kg)  Height: '5\' 9"'$  (1.753 m)   Physical Exam Constitutional:      General: She is not in acute distress. Cardiovascular:     Rate and Rhythm: Normal rate and regular rhythm.     Heart sounds: Normal heart sounds.  Pulmonary:     Effort: Pulmonary effort is normal.     Breath sounds: Normal breath sounds.  Abdominal:     General: Bowel sounds are normal.     Palpations: Abdomen is soft.     Comments: No palpable hepatosplenomegaly  Lymphadenopathy:     Comments: No palpable cervical, supraclavicular, axillary or inguinal adenopathy    Skin:    General: Skin is warm and dry.  Neurological:     Mental Status: She is alert and oriented to person, place, and time.         Latest Ref Rng &  Units 08/10/2021   12:46 PM  CMP  Glucose 70 - 99 mg/dL 214   BUN 8 - 23 mg/dL 51   Creatinine 0.44 - 1.00 mg/dL 1.54   Sodium 135 - 145 mmol/L 136   Potassium 3.5 - 5.1 mmol/L 4.5   Chloride 98 - 111 mmol/L 105   CO2 22 - 32 mmol/L 24   Calcium 8.9 - 10.3 mg/dL 8.9   Total Protein 6.5 - 8.1 g/dL 7.2   Total Bilirubin 0.3 - 1.2 mg/dL 0.8   Alkaline Phos 38 - 126 U/L 88   AST 15 - 41 U/L 21   ALT 0 - 44 U/L 26       Latest Ref Rng & Units 07/18/2022    8:54 AM  CBC  WBC 4.0 - 10.5 K/uL 37.5   Hemoglobin 12.0 - 15.0 g/dL 11.5   Hematocrit 36.0 - 46.0 % 37.0   Platelets 150 - 400 K/uL 205     No images are attached to the encounter.  CT Hip Right Wo Contrast  Result Date: 06/26/2022 CLINICAL DATA:  Worsening right hip pain over the past 3 days. No injury. EXAM: CT OF THE RIGHT HIP WITHOUT CONTRAST TECHNIQUE: Multidetector CT imaging of the right hip was performed according to the standard protocol. Multiplanar CT image reconstructions were also generated. RADIATION DOSE REDUCTION: This exam was performed according to the departmental dose-optimization program which includes automated exposure control, adjustment of the mA and/or kV according to patient size and/or use of iterative reconstruction technique. COMPARISON:  Right hip x-rays from same day. FINDINGS: Bones/Joint/Cartilage No fracture or dislocation. Mild right hip osteoarthritis. No joint effusion. Ligaments Ligaments are suboptimally evaluated by CT. Muscles and Tendons Grossly intact. Soft tissue No fluid collection or hematoma.  No soft tissue mass. IMPRESSION: 1. No acute osseous  abnormality. 2. Mild right hip osteoarthritis. Electronically Signed   By: Titus Dubin M.D.   On: 06/26/2022 11:29   DG Hip Unilat  With Pelvis 2-3 Views Right  Result Date: 06/26/2022 CLINICAL DATA:  Worsening right hip pain for 3 days. No known injury. EXAM: DG HIP (WITH OR WITHOUT PELVIS) 2-3V RIGHT COMPARISON:  None Available. FINDINGS:  There is no evidence of hip fracture or dislocation. There is no evidence of hip arthropathy or other focal bone lesions. Mild sacroiliac osteoarthritis is noted, right side greater than left. Lower lumbar spine degenerative changes are also seen. IMPRESSION: No acute findings. 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 Electronically Signed   By: Marlaine Hind M.D.   On: 06/26/2022 10:41     Assessment and plan- Patient is a 76 y.o. female here for routine follow-up of deficiency anemia  Patient's hemoglobin is at her baseline at 11.5.  Iron studies show normal ferritin of 43 with an iron saturation of 23%.  Patient therefore does not require any IV iron at this time.  Repeat CBC ferritin and iron studies in 6 months in 1 year and I will see her back in 1 year   Visit Diagnosis 1. Iron deficiency anemia, unspecified iron deficiency anemia type      Dr. Randa Evens, MD, MPH [facility] [phone] 07/18/2022 9:19 PM

## 2022-07-20 DIAGNOSIS — E782 Mixed hyperlipidemia: Secondary | ICD-10-CM | POA: Diagnosis not present

## 2022-07-20 DIAGNOSIS — I10 Essential (primary) hypertension: Secondary | ICD-10-CM | POA: Diagnosis not present

## 2022-07-20 DIAGNOSIS — E1122 Type 2 diabetes mellitus with diabetic chronic kidney disease: Secondary | ICD-10-CM | POA: Diagnosis not present

## 2022-07-20 DIAGNOSIS — N1831 Chronic kidney disease, stage 3a: Secondary | ICD-10-CM | POA: Diagnosis not present

## 2022-07-20 DIAGNOSIS — M1990 Unspecified osteoarthritis, unspecified site: Secondary | ICD-10-CM | POA: Diagnosis not present

## 2022-08-02 DIAGNOSIS — E119 Type 2 diabetes mellitus without complications: Secondary | ICD-10-CM | POA: Diagnosis not present

## 2022-08-02 DIAGNOSIS — H524 Presbyopia: Secondary | ICD-10-CM | POA: Diagnosis not present

## 2022-08-07 DIAGNOSIS — I10 Essential (primary) hypertension: Secondary | ICD-10-CM | POA: Diagnosis not present

## 2022-08-07 DIAGNOSIS — M1990 Unspecified osteoarthritis, unspecified site: Secondary | ICD-10-CM | POA: Diagnosis not present

## 2022-08-07 DIAGNOSIS — N1831 Chronic kidney disease, stage 3a: Secondary | ICD-10-CM | POA: Diagnosis not present

## 2022-08-07 DIAGNOSIS — E782 Mixed hyperlipidemia: Secondary | ICD-10-CM | POA: Diagnosis not present

## 2022-08-07 DIAGNOSIS — E1122 Type 2 diabetes mellitus with diabetic chronic kidney disease: Secondary | ICD-10-CM | POA: Diagnosis not present

## 2022-08-11 DIAGNOSIS — B301 Conjunctivitis due to adenovirus: Secondary | ICD-10-CM | POA: Diagnosis not present

## 2022-09-19 ENCOUNTER — Other Ambulatory Visit: Payer: Self-pay | Admitting: Internal Medicine

## 2022-09-19 DIAGNOSIS — I10 Essential (primary) hypertension: Secondary | ICD-10-CM | POA: Diagnosis not present

## 2022-09-19 DIAGNOSIS — N1831 Chronic kidney disease, stage 3a: Secondary | ICD-10-CM | POA: Diagnosis not present

## 2022-09-19 DIAGNOSIS — E1122 Type 2 diabetes mellitus with diabetic chronic kidney disease: Secondary | ICD-10-CM | POA: Diagnosis not present

## 2022-09-19 DIAGNOSIS — Z1231 Encounter for screening mammogram for malignant neoplasm of breast: Secondary | ICD-10-CM

## 2022-09-19 DIAGNOSIS — E782 Mixed hyperlipidemia: Secondary | ICD-10-CM | POA: Diagnosis not present

## 2022-10-02 DIAGNOSIS — E1122 Type 2 diabetes mellitus with diabetic chronic kidney disease: Secondary | ICD-10-CM | POA: Diagnosis not present

## 2022-10-02 DIAGNOSIS — M1990 Unspecified osteoarthritis, unspecified site: Secondary | ICD-10-CM | POA: Diagnosis not present

## 2022-10-02 DIAGNOSIS — Z6837 Body mass index (BMI) 37.0-37.9, adult: Secondary | ICD-10-CM | POA: Diagnosis not present

## 2022-10-02 DIAGNOSIS — N1831 Chronic kidney disease, stage 3a: Secondary | ICD-10-CM | POA: Diagnosis not present

## 2022-10-02 DIAGNOSIS — Z794 Long term (current) use of insulin: Secondary | ICD-10-CM | POA: Diagnosis not present

## 2022-10-02 DIAGNOSIS — C9110 Chronic lymphocytic leukemia of B-cell type not having achieved remission: Secondary | ICD-10-CM | POA: Diagnosis not present

## 2022-10-02 DIAGNOSIS — I10 Essential (primary) hypertension: Secondary | ICD-10-CM | POA: Diagnosis not present

## 2022-10-02 DIAGNOSIS — E1165 Type 2 diabetes mellitus with hyperglycemia: Secondary | ICD-10-CM | POA: Diagnosis not present

## 2022-10-02 DIAGNOSIS — H3411 Central retinal artery occlusion, right eye: Secondary | ICD-10-CM | POA: Diagnosis not present

## 2022-10-02 DIAGNOSIS — E782 Mixed hyperlipidemia: Secondary | ICD-10-CM | POA: Diagnosis not present

## 2022-10-20 DIAGNOSIS — M533 Sacrococcygeal disorders, not elsewhere classified: Secondary | ICD-10-CM | POA: Diagnosis not present

## 2022-10-27 DIAGNOSIS — Z86010 Personal history of colonic polyps: Secondary | ICD-10-CM | POA: Diagnosis not present

## 2022-10-27 DIAGNOSIS — K648 Other hemorrhoids: Secondary | ICD-10-CM | POA: Diagnosis not present

## 2022-10-27 DIAGNOSIS — K5730 Diverticulosis of large intestine without perforation or abscess without bleeding: Secondary | ICD-10-CM | POA: Diagnosis not present

## 2022-10-27 DIAGNOSIS — Z09 Encounter for follow-up examination after completed treatment for conditions other than malignant neoplasm: Secondary | ICD-10-CM | POA: Diagnosis not present

## 2022-11-06 ENCOUNTER — Ambulatory Visit
Admission: RE | Admit: 2022-11-06 | Discharge: 2022-11-06 | Disposition: A | Discharge status: Home / Self Care | Payer: Medicare HMO | Source: Ambulatory Visit | Attending: Internal Medicine | Admitting: Internal Medicine

## 2022-11-06 DIAGNOSIS — Z1231 Encounter for screening mammogram for malignant neoplasm of breast: Secondary | ICD-10-CM | POA: Diagnosis not present

## 2022-12-07 DIAGNOSIS — E1122 Type 2 diabetes mellitus with diabetic chronic kidney disease: Secondary | ICD-10-CM | POA: Diagnosis not present

## 2022-12-07 DIAGNOSIS — I10 Essential (primary) hypertension: Secondary | ICD-10-CM | POA: Diagnosis not present

## 2022-12-07 DIAGNOSIS — N183 Chronic kidney disease, stage 3 unspecified: Secondary | ICD-10-CM | POA: Diagnosis not present

## 2022-12-07 DIAGNOSIS — D638 Anemia in other chronic diseases classified elsewhere: Secondary | ICD-10-CM | POA: Diagnosis not present

## 2022-12-07 DIAGNOSIS — E782 Mixed hyperlipidemia: Secondary | ICD-10-CM | POA: Diagnosis not present

## 2022-12-07 DIAGNOSIS — M1990 Unspecified osteoarthritis, unspecified site: Secondary | ICD-10-CM | POA: Diagnosis not present

## 2022-12-29 DIAGNOSIS — Z20822 Contact with and (suspected) exposure to covid-19: Secondary | ICD-10-CM | POA: Diagnosis not present

## 2022-12-29 DIAGNOSIS — J069 Acute upper respiratory infection, unspecified: Secondary | ICD-10-CM | POA: Diagnosis not present

## 2023-01-01 DIAGNOSIS — N1831 Chronic kidney disease, stage 3a: Secondary | ICD-10-CM | POA: Diagnosis not present

## 2023-01-01 DIAGNOSIS — I10 Essential (primary) hypertension: Secondary | ICD-10-CM | POA: Diagnosis not present

## 2023-01-01 DIAGNOSIS — E782 Mixed hyperlipidemia: Secondary | ICD-10-CM | POA: Diagnosis not present

## 2023-01-01 DIAGNOSIS — M1990 Unspecified osteoarthritis, unspecified site: Secondary | ICD-10-CM | POA: Diagnosis not present

## 2023-01-01 DIAGNOSIS — E1122 Type 2 diabetes mellitus with diabetic chronic kidney disease: Secondary | ICD-10-CM | POA: Diagnosis not present

## 2023-01-18 ENCOUNTER — Inpatient Hospital Stay: Payer: Medicare HMO | Attending: Oncology

## 2023-01-18 DIAGNOSIS — Z856 Personal history of leukemia: Secondary | ICD-10-CM | POA: Diagnosis not present

## 2023-01-18 DIAGNOSIS — D509 Iron deficiency anemia, unspecified: Secondary | ICD-10-CM | POA: Insufficient documentation

## 2023-01-18 LAB — CBC WITH DIFFERENTIAL/PLATELET
Abs Immature Granulocytes: 0.1 10*3/uL — ABNORMAL HIGH (ref 0.00–0.07)
Basophils Absolute: 0.1 10*3/uL (ref 0.0–0.1)
Basophils Relative: 0 %
Eosinophils Absolute: 0.1 10*3/uL (ref 0.0–0.5)
Eosinophils Relative: 0 %
HCT: 37 % (ref 36.0–46.0)
Hemoglobin: 11.8 g/dL — ABNORMAL LOW (ref 12.0–15.0)
Immature Granulocytes: 0 %
Lymphocytes Relative: 89 %
Lymphs Abs: 42 10*3/uL — ABNORMAL HIGH (ref 0.7–4.0)
MCH: 31.3 pg (ref 26.0–34.0)
MCHC: 31.9 g/dL (ref 30.0–36.0)
MCV: 98.1 fL (ref 80.0–100.0)
Monocytes Absolute: 0.6 10*3/uL (ref 0.1–1.0)
Monocytes Relative: 1 %
Neutro Abs: 4.9 10*3/uL (ref 1.7–7.7)
Neutrophils Relative %: 10 %
Platelets: 295 10*3/uL (ref 150–400)
RBC: 3.77 MIL/uL — ABNORMAL LOW (ref 3.87–5.11)
RDW: 13.6 % (ref 11.5–15.5)
Smear Review: NORMAL
WBC: 47.8 10*3/uL — ABNORMAL HIGH (ref 4.0–10.5)
nRBC: 0 % (ref 0.0–0.2)

## 2023-04-09 DIAGNOSIS — Z794 Long term (current) use of insulin: Secondary | ICD-10-CM | POA: Diagnosis not present

## 2023-04-09 DIAGNOSIS — E782 Mixed hyperlipidemia: Secondary | ICD-10-CM | POA: Diagnosis not present

## 2023-04-09 DIAGNOSIS — E1122 Type 2 diabetes mellitus with diabetic chronic kidney disease: Secondary | ICD-10-CM | POA: Diagnosis not present

## 2023-04-09 DIAGNOSIS — C9110 Chronic lymphocytic leukemia of B-cell type not having achieved remission: Secondary | ICD-10-CM | POA: Diagnosis not present

## 2023-04-09 DIAGNOSIS — Z0000 Encounter for general adult medical examination without abnormal findings: Secondary | ICD-10-CM | POA: Diagnosis not present

## 2023-04-09 DIAGNOSIS — N1832 Chronic kidney disease, stage 3b: Secondary | ICD-10-CM | POA: Diagnosis not present

## 2023-04-09 DIAGNOSIS — I10 Essential (primary) hypertension: Secondary | ICD-10-CM | POA: Diagnosis not present

## 2023-04-09 DIAGNOSIS — E113293 Type 2 diabetes mellitus with mild nonproliferative diabetic retinopathy without macular edema, bilateral: Secondary | ICD-10-CM | POA: Diagnosis not present

## 2023-04-09 DIAGNOSIS — D638 Anemia in other chronic diseases classified elsewhere: Secondary | ICD-10-CM | POA: Diagnosis not present

## 2023-04-09 DIAGNOSIS — Z1331 Encounter for screening for depression: Secondary | ICD-10-CM | POA: Diagnosis not present

## 2023-04-10 DIAGNOSIS — E1122 Type 2 diabetes mellitus with diabetic chronic kidney disease: Secondary | ICD-10-CM | POA: Diagnosis not present

## 2023-04-17 DIAGNOSIS — N183 Chronic kidney disease, stage 3 unspecified: Secondary | ICD-10-CM | POA: Diagnosis not present

## 2023-04-17 DIAGNOSIS — D638 Anemia in other chronic diseases classified elsewhere: Secondary | ICD-10-CM | POA: Diagnosis not present

## 2023-04-17 DIAGNOSIS — E782 Mixed hyperlipidemia: Secondary | ICD-10-CM | POA: Diagnosis not present

## 2023-04-17 DIAGNOSIS — M1990 Unspecified osteoarthritis, unspecified site: Secondary | ICD-10-CM | POA: Diagnosis not present

## 2023-04-17 DIAGNOSIS — I10 Essential (primary) hypertension: Secondary | ICD-10-CM | POA: Diagnosis not present

## 2023-04-17 DIAGNOSIS — E1122 Type 2 diabetes mellitus with diabetic chronic kidney disease: Secondary | ICD-10-CM | POA: Diagnosis not present

## 2023-04-26 DIAGNOSIS — I10 Essential (primary) hypertension: Secondary | ICD-10-CM | POA: Diagnosis not present

## 2023-04-26 DIAGNOSIS — C9110 Chronic lymphocytic leukemia of B-cell type not having achieved remission: Secondary | ICD-10-CM | POA: Diagnosis not present

## 2023-04-26 DIAGNOSIS — E782 Mixed hyperlipidemia: Secondary | ICD-10-CM | POA: Diagnosis not present

## 2023-04-26 DIAGNOSIS — E1122 Type 2 diabetes mellitus with diabetic chronic kidney disease: Secondary | ICD-10-CM | POA: Diagnosis not present

## 2023-04-26 DIAGNOSIS — N1831 Chronic kidney disease, stage 3a: Secondary | ICD-10-CM | POA: Diagnosis not present

## 2023-04-26 DIAGNOSIS — R630 Anorexia: Secondary | ICD-10-CM | POA: Diagnosis not present

## 2023-04-26 DIAGNOSIS — R5383 Other fatigue: Secondary | ICD-10-CM | POA: Diagnosis not present

## 2023-05-08 DIAGNOSIS — D649 Anemia, unspecified: Secondary | ICD-10-CM | POA: Diagnosis not present

## 2023-05-08 DIAGNOSIS — I10 Essential (primary) hypertension: Secondary | ICD-10-CM | POA: Diagnosis not present

## 2023-05-15 DIAGNOSIS — C9110 Chronic lymphocytic leukemia of B-cell type not having achieved remission: Secondary | ICD-10-CM | POA: Diagnosis not present

## 2023-05-15 DIAGNOSIS — Z794 Long term (current) use of insulin: Secondary | ICD-10-CM | POA: Diagnosis not present

## 2023-05-15 DIAGNOSIS — E1165 Type 2 diabetes mellitus with hyperglycemia: Secondary | ICD-10-CM | POA: Diagnosis not present

## 2023-05-15 DIAGNOSIS — R5383 Other fatigue: Secondary | ICD-10-CM | POA: Diagnosis not present

## 2023-05-16 DIAGNOSIS — H2513 Age-related nuclear cataract, bilateral: Secondary | ICD-10-CM | POA: Diagnosis not present

## 2023-05-16 DIAGNOSIS — E083299 Diabetes mellitus due to underlying condition with mild nonproliferative diabetic retinopathy without macular edema, unspecified eye: Secondary | ICD-10-CM | POA: Diagnosis not present

## 2023-05-16 DIAGNOSIS — H3411 Central retinal artery occlusion, right eye: Secondary | ICD-10-CM | POA: Diagnosis not present

## 2023-05-16 DIAGNOSIS — H33321 Round hole, right eye: Secondary | ICD-10-CM | POA: Diagnosis not present

## 2023-05-16 DIAGNOSIS — H524 Presbyopia: Secondary | ICD-10-CM | POA: Diagnosis not present

## 2023-05-16 DIAGNOSIS — H35372 Puckering of macula, left eye: Secondary | ICD-10-CM | POA: Diagnosis not present

## 2023-06-04 DIAGNOSIS — H02885 Meibomian gland dysfunction left lower eyelid: Secondary | ICD-10-CM | POA: Diagnosis not present

## 2023-07-20 ENCOUNTER — Other Ambulatory Visit: Payer: Medicare HMO

## 2023-07-20 ENCOUNTER — Ambulatory Visit: Payer: Medicare HMO | Admitting: Oncology

## 2023-07-20 ENCOUNTER — Encounter: Payer: Self-pay | Admitting: Oncology

## 2023-07-20 ENCOUNTER — Inpatient Hospital Stay: Payer: Medicare HMO | Admitting: Oncology

## 2023-07-20 ENCOUNTER — Inpatient Hospital Stay: Payer: Medicare HMO | Attending: Oncology

## 2023-07-20 VITALS — BP 145/92 | HR 75 | Temp 96.5°F | Resp 18 | Ht 69.0 in | Wt 232.7 lb

## 2023-07-20 DIAGNOSIS — D509 Iron deficiency anemia, unspecified: Secondary | ICD-10-CM | POA: Diagnosis not present

## 2023-07-20 DIAGNOSIS — C9110 Chronic lymphocytic leukemia of B-cell type not having achieved remission: Secondary | ICD-10-CM | POA: Insufficient documentation

## 2023-07-20 DIAGNOSIS — Z800 Family history of malignant neoplasm of digestive organs: Secondary | ICD-10-CM | POA: Diagnosis not present

## 2023-07-20 LAB — CBC WITH DIFFERENTIAL/PLATELET
Abs Immature Granulocytes: 0.04 10*3/uL (ref 0.00–0.07)
Basophils Absolute: 0.1 10*3/uL (ref 0.0–0.1)
Basophils Relative: 0 %
Eosinophils Absolute: 0.1 10*3/uL (ref 0.0–0.5)
Eosinophils Relative: 1 %
HCT: 36.5 % (ref 36.0–46.0)
Hemoglobin: 11.7 g/dL — ABNORMAL LOW (ref 12.0–15.0)
Immature Granulocytes: 0 %
Lymphocytes Relative: 79 %
Lymphs Abs: 16.1 10*3/uL — ABNORMAL HIGH (ref 0.7–4.0)
MCH: 30.2 pg (ref 26.0–34.0)
MCHC: 32.1 g/dL (ref 30.0–36.0)
MCV: 94.3 fL (ref 80.0–100.0)
Monocytes Absolute: 0.4 10*3/uL (ref 0.1–1.0)
Monocytes Relative: 2 %
Neutro Abs: 3.6 10*3/uL (ref 1.7–7.7)
Neutrophils Relative %: 18 %
Platelets: 198 10*3/uL (ref 150–400)
RBC: 3.87 MIL/uL (ref 3.87–5.11)
RDW: 14.3 % (ref 11.5–15.5)
Smear Review: NORMAL
WBC: 20.3 10*3/uL — ABNORMAL HIGH (ref 4.0–10.5)
nRBC: 0.2 % (ref 0.0–0.2)

## 2023-07-20 NOTE — Progress Notes (Signed)
Hematology/Oncology Consult note [facility]  Telephone:([phone] Fax:([phone]  Patient Care Team: Georgann Housekeeper, MD as PCP - General (Internal Medicine) Creig Hines, MD as Consulting Physician (Oncology)   Name of the patient: Jala Vanacker  [id number]  [birth year]   Date of visit: 07/20/23  Diagnosis-Rai stage 0 CLL  Chief complaint/ Reason for visit-routine follow-up of CLL  Heme/Onc history: Patient is a 77 year old female diagnosed with Rai stage 0 CLL back in 2010 when she had presented with lymphocytosis.  She has not required any treatment for it so far.  She also has a history of iron deficiency anemia and has received IV iron in the past.  There is also component of chronic kidney disease for which she sees nephrology.   Interval history-patient feels well overall.  Appetite and weight have remained stable.  Denies any unintentional weight loss or fatigue.  ECOG PS- 1 Pain scale- 0   Review of systems- Review of Systems  Constitutional:  Negative for chills, fever, malaise/fatigue and weight loss.  HENT:  Negative for congestion, ear discharge and nosebleeds.   Eyes:  Negative for blurred vision.  Respiratory:  Negative for cough, hemoptysis, sputum production, shortness of breath and wheezing.   Cardiovascular:  Negative for chest pain, palpitations, orthopnea and claudication.  Gastrointestinal:  Negative for abdominal pain, blood in stool, constipation, diarrhea, heartburn, melena, nausea and vomiting.  Genitourinary:  Negative for dysuria, flank pain, frequency, hematuria and urgency.  Musculoskeletal:  Negative for back pain, joint pain and myalgias.  Skin:  Negative for rash.  Neurological:  Negative for dizziness, tingling, focal weakness, seizures, weakness and headaches.  Endo/Heme/Allergies:  Does not bruise/bleed easily.  Psychiatric/Behavioral:  Negative for depression and suicidal ideas. The patient does not have  insomnia.       Allergies  Allergen Reactions   Latex Rash   Sulfa Antibiotics Rash     Past Medical History:  Diagnosis Date   Allergic rhinitis    Chronic kidney disease    Chronic lymphocytic leukemia (HCC)    CLL (chronic lymphocytic leukemia) (HCC)    DA (degenerative arthritis)    Diabetes mellitus    Headache    Heart murmur    History of kidney stones    Hyperlipidemia    Hypertension    Hypertriglyceridemia    Iron deficiency anemia    Other elevated white blood cell count    Unspecified deficiency anemia      Past Surgical History:  Procedure Laterality Date   ABDOMINAL HYSTERECTOMY     50 years   COLONOSCOPY  2008   most recent   EYE SURGERY  1996   lask   JOINT REPLACEMENT     JOINT REPLACEMENT     KNEE ARTHROTOMY Left 05/15/2018   Procedure: KNEE ARTHROTOMY,LYSIS OF ADHESIONS, POLY EXCHANGE;  Surgeon: Donato Heinz, MD;  Location: [facility] ORS;  Service: Orthopedics;  Laterality: Left;   OOPHORECTOMY     REFRACTIVE SURGERY Bilateral 1996   REPLACEMENT TOTAL KNEE Right 2015   REPLACEMENT TOTAL KNEE Left 2007    Social History   Socioeconomic History   Marital status: Married    Spouse name: Not on file   Number of children: Not on file   Years of education: Not on file   Highest education level: Not on file  Occupational History   Not on file  Tobacco Use   Smoking status: Never   Smokeless tobacco: Never  Vaping Use  Vaping status: Never Used  Substance and Sexual Activity   Alcohol use: No   Drug use: No   Sexual activity: Not on file  Other Topics Concern   Not on file  Social History Narrative   Not on file   Social Determinants of Health   Financial Resource Strain: Low Risk  (05/29/2022)   Received from [facility]   Overall Financial Resource Strain (CARDIA)  Food Insecurity: No Food Insecurity (05/29/2022)   Received from [facility]   Hunger Vital Sign  Transportation Needs: No  Transportation Needs (05/29/2022)   Received from [facility]   PRAPARE - Transportation  Physical Activity: Sufficiently Active (05/29/2022)   Received from [facility]   Exercise Vital Sign  Stress: Stress Concern Present (05/29/2022)   Received from [facility] of Occupational Health - Occupational Stress Questionnaire  Social Connections: Socially Integrated (05/29/2022)   Received from [facility]   Social Connection and Isolation Panel [NHANES]  Intimate Partner Violence: Not on file    Family History  Problem Relation Age of Onset   Cancer Father        gastric cancer   Cancer Sister        gastric cancer   Diabetes Mother    Hypertension Mother    Diabetes Brother    Diabetes Brother    Diabetes Sister    Breast cancer Neg Hx      Current Outpatient Medications:    ACCU-CHEK GUIDE test strip, USE AS DIRECTED 3 TO 4 TIMES PER DAY, Disp: , Rfl:    acetaminophen (TYLENOL) 500 MG tablet, Take 1,000 mg by mouth every 6 (six) hours as needed for moderate pain or headache., Disp: , Rfl:    amLODipine (NORVASC) 5 MG tablet, Take 5 mg by mouth every evening. , Disp: , Rfl: 11   aspirin EC 81 MG tablet, Take 81 mg by mouth daily., Disp: , Rfl:    BD PEN NEEDLE NANO 2ND GEN 32G X 4 MM MISC, SMARTSIG:Pre-Filled Pen Syringe SUB-Q Daily, Disp: , Rfl:    benazepril-hydrochlorthiazide (LOTENSIN HCT) 20-12.5 MG per tablet, Take 1 tablet by mouth daily. , Disp: , Rfl:    Cholecalciferol 5000 units capsule, Take 5,000 Units by mouth daily. , Disp: , Rfl:    Continuous Blood Gluc Sensor (FREESTYLE LIBRE 14 DAY SENSOR) MISC, Apply topically., Disp: , Rfl:    dapagliflozin propanediol (FARXIGA) 10 MG TABS tablet, 1 tablet, Disp: , Rfl:    ferrous sulfate 325 (65 FE) MG tablet, Take 325 mg by mouth daily with breakfast., Disp: , Rfl:    glimepiride (AMARYL) 2 MG tablet, Take 2 mg by mouth in the morning  and at bedtime., Disp: , Rfl:    HYDROcodone-acetaminophen (NORCO/VICODIN) 5-325 MG tablet, Take 1 tablet by mouth every 4 (four) hours as needed., Disp: 12 tablet, Rfl: 0   insulin degludec (TRESIBA) 100 UNIT/ML FlexTouch Pen, Inject 18 Units into the skin daily., Disp: , Rfl:    meclizine (ANTIVERT) 25 MG tablet, Take 25 mg by mouth 3 (three) times daily as needed for dizziness., Disp: , Rfl:    meloxicam (MOBIC) 15 MG tablet, , Disp: , Rfl:    methocarbamol (ROBAXIN) 500 MG tablet, methocarbamol 500 mg tablet  TAKE 1 TABLET BY MOUTH TWICE A DAY, Disp: , Rfl:    Multiple Vitamins-Minerals (CEROVITE SENIOR) TABS, Take 1 tablet by mouth daily., Disp: ,  Rfl:    simvastatin (ZOCOR) 10 MG tablet, Take 10 mg by mouth at bedtime. , Disp: , Rfl: 3   amoxicillin (AMOXIL) 500 MG capsule, TAKE 4 CAPSULES BY MOUTH 1 HOUR PRIOR TO DENTAL APPOINTMENT (Patient not taking: No sig reported), Disp: , Rfl: 0  Physical exam:  Vitals:   07/20/23 1431  BP: (!) 145/92  Pulse: 75  Resp: 18  Temp: (!) 96.5 F (35.8 C)  TempSrc: Tympanic  SpO2: 99%  Weight: 232 lb 11.2 oz (105.6 kg)  Height: 5\' 9"  (1.753 m)   Physical Exam Cardiovascular:     Rate and Rhythm: Normal rate and regular rhythm.     Heart sounds: Normal heart sounds.  Pulmonary:     Effort: Pulmonary effort is normal.     Breath sounds: Normal breath sounds.  Abdominal:     General: Bowel sounds are normal.     Palpations: Abdomen is soft.     Comments: No palpable hepatosplenomegaly  Lymphadenopathy:     Comments: No palpable cervical, supraclavicular, axillary or inguinal adenopathy    Skin:    General: Skin is warm and dry.  Neurological:     Mental Status: She is alert and oriented to person, place, and time.         Latest Ref Rng & Units 08/10/2021   12:46 PM  CMP  Glucose 70 - 99 mg/dL 595   BUN 8 - 23 mg/dL 51   Creatinine 6.38 - 1.00 mg/dL 7.56   Sodium 433 - 295 mmol/L 136   Potassium 3.5 - 5.1 mmol/L 4.5   Chloride  98 - 111 mmol/L 105   CO2 22 - 32 mmol/L 24   Calcium 8.9 - 10.3 mg/dL 8.9   Total Protein 6.5 - 8.1 g/dL 7.2   Total Bilirubin 0.3 - 1.2 mg/dL 0.8   Alkaline Phos 38 - 126 U/L 88   AST 15 - 41 U/L 21   ALT 0 - 44 U/L 26       Latest Ref Rng & Units 07/20/2023    1:56 PM  CBC  WBC 4.0 - 10.5 K/uL 20.3   Hemoglobin 12.0 - 15.0 g/dL 18.8   Hematocrit 41.6 - 46.0 % 36.5   Platelets 150 - 400 K/uL 198      Assessment and plan- Patient is a 77 y.o. female here for routine follow-upFor following issues  Rai stage 0 CLL: Patient has stable lymphocytosis with a white count that fluctuates between 27-37.  Hemoglobin has remained stable around 11.5.  Platelet counts are normal.  No B symptoms.  Clinically patient does not have any significant palpable adenopathy or hepatosplenomegaly.  She does not require any treatment for her CLL at this time.  History of iron deficiency anemia: Hemoglobin remains stable around 11.5.  I will repeat CBC ferritin and iron studies in 1 year and see her thereafter   Visit Diagnosis 1. Iron deficiency anemia, unspecified iron deficiency anemia type   2. CLL (chronic lymphocytic leukemia) (HCC)      Dr. Owens Shark, MD, MPH [facility] [phone] 07/20/2023 3:27 PM

## 2023-08-07 DIAGNOSIS — E1165 Type 2 diabetes mellitus with hyperglycemia: Secondary | ICD-10-CM | POA: Diagnosis not present

## 2023-08-07 DIAGNOSIS — E1122 Type 2 diabetes mellitus with diabetic chronic kidney disease: Secondary | ICD-10-CM | POA: Diagnosis not present

## 2023-08-07 DIAGNOSIS — C9110 Chronic lymphocytic leukemia of B-cell type not having achieved remission: Secondary | ICD-10-CM | POA: Diagnosis not present

## 2023-08-07 DIAGNOSIS — E782 Mixed hyperlipidemia: Secondary | ICD-10-CM | POA: Diagnosis not present

## 2023-08-07 DIAGNOSIS — N1831 Chronic kidney disease, stage 3a: Secondary | ICD-10-CM | POA: Diagnosis not present

## 2023-08-07 DIAGNOSIS — E113293 Type 2 diabetes mellitus with mild nonproliferative diabetic retinopathy without macular edema, bilateral: Secondary | ICD-10-CM | POA: Diagnosis not present

## 2023-08-07 DIAGNOSIS — I10 Essential (primary) hypertension: Secondary | ICD-10-CM | POA: Diagnosis not present

## 2023-08-07 DIAGNOSIS — D638 Anemia in other chronic diseases classified elsewhere: Secondary | ICD-10-CM | POA: Diagnosis not present

## 2023-08-09 DIAGNOSIS — E119 Type 2 diabetes mellitus without complications: Secondary | ICD-10-CM | POA: Diagnosis not present

## 2023-08-09 DIAGNOSIS — M069 Rheumatoid arthritis, unspecified: Secondary | ICD-10-CM | POA: Diagnosis not present

## 2023-08-16 DIAGNOSIS — M7542 Impingement syndrome of left shoulder: Secondary | ICD-10-CM | POA: Diagnosis not present

## 2023-08-31 DIAGNOSIS — H524 Presbyopia: Secondary | ICD-10-CM | POA: Diagnosis not present

## 2023-10-10 ENCOUNTER — Other Ambulatory Visit: Payer: Self-pay | Admitting: Internal Medicine

## 2023-10-10 DIAGNOSIS — Z1231 Encounter for screening mammogram for malignant neoplasm of breast: Secondary | ICD-10-CM

## 2023-11-09 ENCOUNTER — Ambulatory Visit
Admission: RE | Admit: 2023-11-09 | Discharge: 2023-11-09 | Disposition: A | Discharge status: Home / Self Care | Payer: Medicare HMO | Source: Ambulatory Visit | Attending: Internal Medicine | Admitting: Internal Medicine

## 2023-11-09 DIAGNOSIS — Z1231 Encounter for screening mammogram for malignant neoplasm of breast: Secondary | ICD-10-CM | POA: Diagnosis not present

## 2023-11-15 DIAGNOSIS — Z03818 Encounter for observation for suspected exposure to other biological agents ruled out: Secondary | ICD-10-CM | POA: Diagnosis not present

## 2023-11-15 DIAGNOSIS — J069 Acute upper respiratory infection, unspecified: Secondary | ICD-10-CM | POA: Diagnosis not present

## 2023-12-18 DIAGNOSIS — Z794 Long term (current) use of insulin: Secondary | ICD-10-CM | POA: Diagnosis not present

## 2023-12-18 DIAGNOSIS — I10 Essential (primary) hypertension: Secondary | ICD-10-CM | POA: Diagnosis not present

## 2023-12-18 DIAGNOSIS — E1122 Type 2 diabetes mellitus with diabetic chronic kidney disease: Secondary | ICD-10-CM | POA: Diagnosis not present

## 2023-12-18 DIAGNOSIS — E1165 Type 2 diabetes mellitus with hyperglycemia: Secondary | ICD-10-CM | POA: Diagnosis not present

## 2023-12-18 DIAGNOSIS — N1831 Chronic kidney disease, stage 3a: Secondary | ICD-10-CM | POA: Diagnosis not present

## 2023-12-18 DIAGNOSIS — H3411 Central retinal artery occlusion, right eye: Secondary | ICD-10-CM | POA: Diagnosis not present

## 2023-12-18 DIAGNOSIS — C9110 Chronic lymphocytic leukemia of B-cell type not having achieved remission: Secondary | ICD-10-CM | POA: Diagnosis not present

## 2023-12-18 DIAGNOSIS — D638 Anemia in other chronic diseases classified elsewhere: Secondary | ICD-10-CM | POA: Diagnosis not present

## 2023-12-18 DIAGNOSIS — E113293 Type 2 diabetes mellitus with mild nonproliferative diabetic retinopathy without macular edema, bilateral: Secondary | ICD-10-CM | POA: Diagnosis not present

## 2024-01-08 DIAGNOSIS — M2012 Hallux valgus (acquired), left foot: Secondary | ICD-10-CM | POA: Diagnosis not present

## 2024-01-08 DIAGNOSIS — M898X9 Other specified disorders of bone, unspecified site: Secondary | ICD-10-CM | POA: Diagnosis not present

## 2024-01-08 DIAGNOSIS — E119 Type 2 diabetes mellitus without complications: Secondary | ICD-10-CM | POA: Diagnosis not present

## 2024-01-30 DIAGNOSIS — N1831 Chronic kidney disease, stage 3a: Secondary | ICD-10-CM | POA: Diagnosis not present

## 2024-01-30 DIAGNOSIS — E1122 Type 2 diabetes mellitus with diabetic chronic kidney disease: Secondary | ICD-10-CM | POA: Diagnosis not present

## 2024-01-30 DIAGNOSIS — J029 Acute pharyngitis, unspecified: Secondary | ICD-10-CM | POA: Diagnosis not present

## 2024-01-30 DIAGNOSIS — E1165 Type 2 diabetes mellitus with hyperglycemia: Secondary | ICD-10-CM | POA: Diagnosis not present

## 2024-01-30 DIAGNOSIS — R32 Unspecified urinary incontinence: Secondary | ICD-10-CM | POA: Diagnosis not present

## 2024-02-11 DIAGNOSIS — M2042 Other hammer toe(s) (acquired), left foot: Secondary | ICD-10-CM | POA: Diagnosis not present

## 2024-03-06 DIAGNOSIS — M898X7 Other specified disorders of bone, ankle and foot: Secondary | ICD-10-CM | POA: Diagnosis not present

## 2024-04-17 DIAGNOSIS — M1990 Unspecified osteoarthritis, unspecified site: Secondary | ICD-10-CM | POA: Diagnosis not present

## 2024-04-17 DIAGNOSIS — Z794 Long term (current) use of insulin: Secondary | ICD-10-CM | POA: Diagnosis not present

## 2024-04-17 DIAGNOSIS — I10 Essential (primary) hypertension: Secondary | ICD-10-CM | POA: Diagnosis not present

## 2024-04-17 DIAGNOSIS — E1122 Type 2 diabetes mellitus with diabetic chronic kidney disease: Secondary | ICD-10-CM | POA: Diagnosis not present

## 2024-04-17 DIAGNOSIS — E782 Mixed hyperlipidemia: Secondary | ICD-10-CM | POA: Diagnosis not present

## 2024-04-17 DIAGNOSIS — N1831 Chronic kidney disease, stage 3a: Secondary | ICD-10-CM | POA: Diagnosis not present

## 2024-04-17 DIAGNOSIS — C9110 Chronic lymphocytic leukemia of B-cell type not having achieved remission: Secondary | ICD-10-CM | POA: Diagnosis not present

## 2024-04-17 DIAGNOSIS — Z1331 Encounter for screening for depression: Secondary | ICD-10-CM | POA: Diagnosis not present

## 2024-04-17 DIAGNOSIS — E113293 Type 2 diabetes mellitus with mild nonproliferative diabetic retinopathy without macular edema, bilateral: Secondary | ICD-10-CM | POA: Diagnosis not present

## 2024-04-17 DIAGNOSIS — Z0000 Encounter for general adult medical examination without abnormal findings: Secondary | ICD-10-CM | POA: Diagnosis not present

## 2024-04-17 DIAGNOSIS — H3411 Central retinal artery occlusion, right eye: Secondary | ICD-10-CM | POA: Diagnosis not present

## 2024-04-18 DIAGNOSIS — E1122 Type 2 diabetes mellitus with diabetic chronic kidney disease: Secondary | ICD-10-CM | POA: Diagnosis not present

## 2024-05-09 DIAGNOSIS — N1831 Chronic kidney disease, stage 3a: Secondary | ICD-10-CM | POA: Diagnosis not present

## 2024-05-09 DIAGNOSIS — E1122 Type 2 diabetes mellitus with diabetic chronic kidney disease: Secondary | ICD-10-CM | POA: Diagnosis not present

## 2024-05-09 DIAGNOSIS — I10 Essential (primary) hypertension: Secondary | ICD-10-CM | POA: Diagnosis not present

## 2024-05-09 DIAGNOSIS — E1165 Type 2 diabetes mellitus with hyperglycemia: Secondary | ICD-10-CM | POA: Diagnosis not present

## 2024-05-14 DIAGNOSIS — H3411 Central retinal artery occlusion, right eye: Secondary | ICD-10-CM | POA: Diagnosis not present

## 2024-05-14 DIAGNOSIS — H2513 Age-related nuclear cataract, bilateral: Secondary | ICD-10-CM | POA: Diagnosis not present

## 2024-05-14 DIAGNOSIS — H33321 Round hole, right eye: Secondary | ICD-10-CM | POA: Diagnosis not present

## 2024-05-14 DIAGNOSIS — E083299 Diabetes mellitus due to underlying condition with mild nonproliferative diabetic retinopathy without macular edema, unspecified eye: Secondary | ICD-10-CM | POA: Diagnosis not present

## 2024-05-14 DIAGNOSIS — H524 Presbyopia: Secondary | ICD-10-CM | POA: Diagnosis not present

## 2024-05-14 DIAGNOSIS — H35372 Puckering of macula, left eye: Secondary | ICD-10-CM | POA: Diagnosis not present

## 2024-05-19 DIAGNOSIS — E1165 Type 2 diabetes mellitus with hyperglycemia: Secondary | ICD-10-CM | POA: Diagnosis not present

## 2024-05-19 DIAGNOSIS — E1122 Type 2 diabetes mellitus with diabetic chronic kidney disease: Secondary | ICD-10-CM | POA: Diagnosis not present

## 2024-05-19 DIAGNOSIS — N1831 Chronic kidney disease, stage 3a: Secondary | ICD-10-CM | POA: Diagnosis not present

## 2024-05-19 DIAGNOSIS — I10 Essential (primary) hypertension: Secondary | ICD-10-CM | POA: Diagnosis not present

## 2024-05-20 DIAGNOSIS — M25471 Effusion, right ankle: Secondary | ICD-10-CM | POA: Diagnosis not present

## 2024-06-19 DIAGNOSIS — N1831 Chronic kidney disease, stage 3a: Secondary | ICD-10-CM | POA: Diagnosis not present

## 2024-06-19 DIAGNOSIS — E1122 Type 2 diabetes mellitus with diabetic chronic kidney disease: Secondary | ICD-10-CM | POA: Diagnosis not present

## 2024-06-19 DIAGNOSIS — I10 Essential (primary) hypertension: Secondary | ICD-10-CM | POA: Diagnosis not present

## 2024-06-19 DIAGNOSIS — E1165 Type 2 diabetes mellitus with hyperglycemia: Secondary | ICD-10-CM | POA: Diagnosis not present

## 2024-06-27 ENCOUNTER — Telehealth: Payer: Self-pay | Admitting: Oncology

## 2024-06-27 NOTE — Telephone Encounter (Signed)
 MD not here 07/18/24.  Spoke with pt to confirm new appt date/times. AVS mailed out.

## 2024-07-14 DIAGNOSIS — M7542 Impingement syndrome of left shoulder: Secondary | ICD-10-CM | POA: Diagnosis not present

## 2024-07-18 ENCOUNTER — Other Ambulatory Visit: Payer: Medicare HMO

## 2024-07-18 ENCOUNTER — Ambulatory Visit: Payer: Medicare HMO | Admitting: Oncology

## 2024-07-20 DIAGNOSIS — E1165 Type 2 diabetes mellitus with hyperglycemia: Secondary | ICD-10-CM | POA: Diagnosis not present

## 2024-07-20 DIAGNOSIS — N1831 Chronic kidney disease, stage 3a: Secondary | ICD-10-CM | POA: Diagnosis not present

## 2024-07-20 DIAGNOSIS — E1122 Type 2 diabetes mellitus with diabetic chronic kidney disease: Secondary | ICD-10-CM | POA: Diagnosis not present

## 2024-07-20 DIAGNOSIS — I10 Essential (primary) hypertension: Secondary | ICD-10-CM | POA: Diagnosis not present

## 2024-07-31 ENCOUNTER — Other Ambulatory Visit: Payer: Self-pay

## 2024-07-31 DIAGNOSIS — D509 Iron deficiency anemia, unspecified: Secondary | ICD-10-CM

## 2024-08-01 ENCOUNTER — Inpatient Hospital Stay (HOSPITAL_BASED_OUTPATIENT_CLINIC_OR_DEPARTMENT_OTHER): Admitting: Oncology

## 2024-08-01 ENCOUNTER — Telehealth: Payer: Self-pay

## 2024-08-01 ENCOUNTER — Encounter: Payer: Self-pay | Admitting: Oncology

## 2024-08-01 ENCOUNTER — Inpatient Hospital Stay: Attending: Oncology

## 2024-08-01 VITALS — BP 138/92 | HR 80 | Temp 96.9°F | Resp 20 | Wt 231.8 lb

## 2024-08-01 DIAGNOSIS — C9110 Chronic lymphocytic leukemia of B-cell type not having achieved remission: Secondary | ICD-10-CM | POA: Diagnosis not present

## 2024-08-01 DIAGNOSIS — Z800 Family history of malignant neoplasm of digestive organs: Secondary | ICD-10-CM | POA: Insufficient documentation

## 2024-08-01 DIAGNOSIS — Z8639 Personal history of other endocrine, nutritional and metabolic disease: Secondary | ICD-10-CM

## 2024-08-01 DIAGNOSIS — D509 Iron deficiency anemia, unspecified: Secondary | ICD-10-CM

## 2024-08-01 LAB — CBC WITH DIFFERENTIAL (CANCER CENTER ONLY)
Abs Immature Granulocytes: 0.2 K/uL — ABNORMAL HIGH (ref 0.00–0.07)
Basophils Absolute: 0.1 K/uL (ref 0.0–0.1)
Basophils Relative: 0 %
Eosinophils Absolute: 0.1 K/uL (ref 0.0–0.5)
Eosinophils Relative: 0 %
HCT: 37.6 % (ref 36.0–46.0)
Hemoglobin: 11.9 g/dL — ABNORMAL LOW (ref 12.0–15.0)
Immature Granulocytes: 0 %
Lymphocytes Relative: 90 %
Lymphs Abs: 51.6 K/uL — ABNORMAL HIGH (ref 0.7–4.0)
MCH: 31.1 pg (ref 26.0–34.0)
MCHC: 31.6 g/dL (ref 30.0–36.0)
MCV: 98.2 fL (ref 80.0–100.0)
Monocytes Absolute: 0.4 K/uL (ref 0.1–1.0)
Monocytes Relative: 1 %
Neutro Abs: 5.2 K/uL (ref 1.7–7.7)
Neutrophils Relative %: 9 %
Platelet Count: 188 K/uL (ref 150–400)
RBC: 3.83 MIL/uL — ABNORMAL LOW (ref 3.87–5.11)
RDW: 14.2 % (ref 11.5–15.5)
Smear Review: NORMAL
WBC Count: 57.6 K/uL (ref 4.0–10.5)
nRBC: 0 % (ref 0.0–0.2)

## 2024-08-01 LAB — IRON AND TIBC
Iron: 86 ug/dL (ref 28–170)
Saturation Ratios: 26 % (ref 10.4–31.8)
TIBC: 328 ug/dL (ref 250–450)
UIBC: 242 ug/dL

## 2024-08-01 LAB — FERRITIN: Ferritin: 74 ng/mL (ref 11–307)

## 2024-08-01 NOTE — Telephone Encounter (Signed)
 Critical alert value: WBC 57.6 (read back completed with Chase)

## 2024-08-02 NOTE — Progress Notes (Signed)
 Hematology/Oncology Consult note [facility]  [phone] [phone]  Patient Care Team: Ransom Other, MD as PCP - General (Internal Medicine) Melanee Annah BROCKS, MD as Consulting Physician (Oncology)   Name of the patient: Tammy [name]  [id number]  08-20-1946   Date of visit: 08/02/24  Diagnosis-Rai stage 0 CLL  Chief complaint/ Reason for visit-routine follow-up of CLL  Heme/Onc history: Patient is a 78 year old female diagnosed with Rai stage 0 CLL back in 2010 when she had presented with lymphocytosis.  She has not required any treatment for it so far.  She also has a history of iron deficiency anemia and has received IV iron in the past.  There is also component of chronic kidney disease for which she sees nephrology.     Interval history-patient is doing well presently.  Denies any changes in her appetite or weight.  Denies any fatigue or drenching night sweats.  Denies any palpable lumps or bumps anywhere.   ECOG PS- 1 Pain scale- 0  Review of systems- Review of Systems  Constitutional:  Negative for chills, fever, malaise/fatigue and weight loss.  HENT:  Negative for congestion, ear discharge and nosebleeds.   Eyes:  Negative for blurred vision.  Respiratory:  Negative for cough, hemoptysis, sputum production, shortness of breath and wheezing.   Cardiovascular:  Negative for chest pain, palpitations, orthopnea and claudication.  Gastrointestinal:  Negative for abdominal pain, blood in stool, constipation, diarrhea, heartburn, melena, nausea and vomiting.  Genitourinary:  Negative for dysuria, flank pain, frequency, hematuria and urgency.  Musculoskeletal:  Negative for back pain, joint pain and myalgias.  Skin:  Negative for rash.  Neurological:  Negative for dizziness, tingling, focal weakness, seizures, weakness and headaches.  Endo/Heme/Allergies:  Does not bruise/bleed easily.  Psychiatric/Behavioral:  Negative for  depression and suicidal ideas. The patient does not have insomnia.       Allergies  Allergen Reactions   Latex Rash   Sulfa Antibiotics Rash     Past Medical History:  Diagnosis Date   Allergic rhinitis    Chronic kidney disease    Chronic lymphocytic leukemia (HCC)    CLL (chronic lymphocytic leukemia) (HCC)    DA (degenerative arthritis)    Diabetes mellitus    Headache    Heart murmur    History of kidney stones    Hyperlipidemia    Hypertension    Hypertriglyceridemia    Iron deficiency anemia    Other elevated white blood cell count    Unspecified deficiency anemia      Past Surgical History:  Procedure Laterality Date   ABDOMINAL HYSTERECTOMY     50 years   COLONOSCOPY  2008   most recent   EYE SURGERY  1996   lask   JOINT REPLACEMENT     JOINT REPLACEMENT     KNEE ARTHROTOMY Left 05/15/2018   Procedure: KNEE ARTHROTOMY,LYSIS OF ADHESIONS, POLY EXCHANGE;  Surgeon: Mardee Lynwood SQUIBB, MD;  Location: [facility] ORS;  Service: Orthopedics;  Laterality: Left;   OOPHORECTOMY     REFRACTIVE SURGERY Bilateral 1996   REPLACEMENT TOTAL KNEE Right 2015   REPLACEMENT TOTAL KNEE Left 2007    Social History   Socioeconomic History   Marital status: Married    Spouse name: Not on file   Number of children: Not on file   Years of education: Not on file   Highest education level: Not on file  Occupational History   Not on file  Tobacco Use  Smoking status: Never   Smokeless tobacco: Never  Vaping Use   Vaping status: Never Used  Substance and Sexual Activity   Alcohol use: No   Drug use: No   Sexual activity: Not on file  Other Topics Concern   Not on file  Social History Narrative   Not on file   Social Drivers of Health   Financial Resource Strain: Low Risk  (01/08/2024)   Received from [facility]   Overall Financial Resource Strain (CARDIA)    Difficulty of Paying Living Expenses: Not very hard  Food Insecurity: No Food Insecurity  (01/08/2024)   Received from [facility]   Hunger Vital Sign    Within the past 12 months, you worried that your food would run out before you got the money to buy more.: Never true    Within the past 12 months, the food you bought just didn't last and you didn't have money to get more.: Never true  Transportation Needs: No Transportation Needs (01/08/2024)   Received from [facility] - Transportation    In the past 12 months, has lack of transportation kept you from medical appointments or from getting medications?: No    Lack of Transportation (Non-Medical): No  Physical Activity: Sufficiently Active (05/29/2022)   Received from [facility]   Exercise Vital Sign  Stress: Stress Concern Present (05/29/2022)   Received from [facility] - Occupational Stress Questionnaire  Social Connections: Socially Integrated (05/29/2022)   Received from [facility]   Social Connection and Isolation Panel  Intimate Partner Violence: Not on file    Family History  Problem Relation Age of Onset   Cancer Father        gastric cancer   Cancer Sister        gastric cancer   Diabetes Mother    Hypertension Mother    Diabetes Brother    Diabetes Brother    Diabetes Sister    Breast cancer Neg Hx      Current Outpatient Medications:    ACCU-CHEK GUIDE test strip, USE AS DIRECTED 3 TO 4 TIMES PER DAY, Disp: , Rfl:    acetaminophen  (TYLENOL ) 500 MG tablet, Take 1,000 mg by mouth every 6 (six) hours as needed for moderate pain or headache., Disp: , Rfl:    amLODipine  (NORVASC ) 5 MG tablet, Take 5 mg by mouth every evening. , Disp: , Rfl: 11   aspirin  EC 81 MG tablet, Take 81 mg by mouth daily., Disp: , Rfl:    azelastine (ASTELIN) 0.1 % nasal spray, 2 sprays in each nostril Nasally Twice a day; Duration: 30 day(s), Disp: , Rfl:    BD PEN NEEDLE NANO 2ND GEN 32G X  4 MM MISC, SMARTSIG:Pre-Filled Pen Syringe SUB-Q Daily, Disp: , Rfl:    Cholecalciferol  5000 units capsule, Take 5,000 Units by mouth daily. , Disp: , Rfl:    Continuous Blood Gluc Sensor (FREESTYLE LIBRE 14 DAY SENSOR) MISC, Apply topically., Disp: , Rfl:    dapagliflozin propanediol (FARXIGA) 10 MG TABS tablet, 1 tablet, Disp: , Rfl:    ferrous sulfate  325 (65 FE) MG tablet, Take 325 mg by mouth daily with breakfast., Disp: , Rfl:    glimepiride  (AMARYL ) 2 MG tablet, Take 2 mg by mouth in the morning and at bedtime., Disp: , Rfl:    HYDROcodone -acetaminophen  (NORCO/VICODIN) 5-325 MG tablet, Take 1  tablet by mouth every 4 (four) hours as needed., Disp: 12 tablet, Rfl: 0   insulin  degludec (TRESIBA) 100 UNIT/ML FlexTouch Pen, Inject 18 Units into the skin daily., Disp: , Rfl:    losartan (COZAAR) 100 MG tablet, Take 100 mg by mouth daily., Disp: , Rfl:    meclizine  (ANTIVERT ) 25 MG tablet, Take 25 mg by mouth 3 (three) times daily as needed for dizziness., Disp: , Rfl:    meloxicam (MOBIC) 15 MG tablet, , Disp: , Rfl:    methocarbamol (ROBAXIN) 500 MG tablet, methocarbamol 500 mg tablet  TAKE 1 TABLET BY MOUTH TWICE A DAY, Disp: , Rfl:    Multiple Vitamins-Minerals (CEROVITE SENIOR) TABS, Take 1 tablet by mouth daily., Disp: , Rfl:    simvastatin  (ZOCOR ) 10 MG tablet, Take 10 mg by mouth at bedtime. , Disp: , Rfl: 3   amoxicillin (AMOXIL) 500 MG capsule, TAKE 4 CAPSULES BY MOUTH 1 HOUR PRIOR TO DENTAL APPOINTMENT (Patient not taking: Reported on 08/01/2024), Disp: , Rfl: 0   benazepril -hydrochlorthiazide (LOTENSIN  HCT) 20-12.5 MG per tablet, Take 1 tablet by mouth daily.  (Patient not taking: Reported on 08/01/2024), Disp: , Rfl:    Semaglutide, 1 MG/DOSE, (OZEMPIC, 1 MG/DOSE,) 4 MG/3ML SOPN, INJECT 1 MG SUBCUTANEOUS ONCE A WEEK 28 DAYS; Duration: 28, Disp: , Rfl:   Physical exam:  Vitals:   08/01/24 1406  BP: (!) 138/92  Pulse: 80  Resp: 20  Temp: (!) 96.9 F (36.1 C)  SpO2: 100%  Weight:  231 lb 12.8 oz (105.1 kg)   Physical Exam Cardiovascular:     Rate and Rhythm: Normal rate and regular rhythm.     Heart sounds: Normal heart sounds.  Pulmonary:     Effort: Pulmonary effort is normal.     Breath sounds: Normal breath sounds.  Abdominal:     General: Bowel sounds are normal.     Palpations: Abdomen is soft.     Comments: No palpable hepatosplenomegaly  Lymphadenopathy:     Comments: No palpable cervical, supraclavicular, axillary or inguinal adenopathy    Skin:    General: Skin is warm and dry.  Neurological:     Mental Status: She is alert and oriented to person, place, and time.      I have personally reviewed labs listed below:    Latest Ref Rng & Units 08/10/2021   12:46 PM  CMP  Glucose 70 - 99 mg/dL 785   BUN 8 - 23 mg/dL 51   Creatinine 9.55 - 1.00 mg/dL 8.45   Sodium 864 - 854 mmol/L 136   Potassium 3.5 - 5.1 mmol/L 4.5   Chloride 98 - 111 mmol/L 105   CO2 22 - 32 mmol/L 24   Calcium 8.9 - 10.3 mg/dL 8.9   Total Protein 6.5 - 8.1 g/dL 7.2   Total Bilirubin 0.3 - 1.2 mg/dL 0.8   Alkaline Phos 38 - 126 U/L 88   AST 15 - 41 U/L 21   ALT 0 - 44 U/L 26       Latest Ref Rng & Units 08/01/2024    1:52 PM  CBC  WBC 4.0 - 10.5 K/uL 57.6   Hemoglobin 12.0 - 15.0 g/dL 88.0   Hematocrit 63.9 - 46.0 % 37.6   Platelets 150 - 400 K/uL 188     Assessment and plan- Patient is a 78 y.o. female who is here for routine follow-up of Rai stage 0 CLL  Patient's white cell count today is elevated at  57.6 with an absolute lymphocyte count of 51.6.  Lymphocyte doubling count time remains more than 6 months.  Hemoglobin is stable between 11-12.  No evidence of thrombocytopenia.  On clinical exam patient does not have any palpable adenopathy splenomegaly.  She does not report any B symptoms.  Her CLL does not require any treatment at this time.  Patient has a prior history of iron deficiency anemia.  Ferritin levels are normal at 74 with iron saturation of 26%.   She does not require any IV iron at this time.  CBC ferritin and iron studies in 6 months in 1 year and I will see her back in 1 year   Visit Diagnosis 1. CLL (chronic lymphocytic leukemia) (HCC)   2. History of iron deficiency      Dr. Annah Skene, MD, MPH [facility] [phone] 08/02/2024 3:23 PM

## 2024-08-11 DIAGNOSIS — H524 Presbyopia: Secondary | ICD-10-CM | POA: Diagnosis not present

## 2024-08-11 DIAGNOSIS — E119 Type 2 diabetes mellitus without complications: Secondary | ICD-10-CM | POA: Diagnosis not present

## 2024-08-14 DIAGNOSIS — M50122 Cervical disc disorder at C5-C6 level with radiculopathy: Secondary | ICD-10-CM | POA: Diagnosis not present

## 2024-08-14 DIAGNOSIS — M65312 Trigger thumb, left thumb: Secondary | ICD-10-CM | POA: Diagnosis not present

## 2024-08-19 DIAGNOSIS — E1165 Type 2 diabetes mellitus with hyperglycemia: Secondary | ICD-10-CM | POA: Diagnosis not present

## 2024-08-19 DIAGNOSIS — E1122 Type 2 diabetes mellitus with diabetic chronic kidney disease: Secondary | ICD-10-CM | POA: Diagnosis not present

## 2024-08-19 DIAGNOSIS — I10 Essential (primary) hypertension: Secondary | ICD-10-CM | POA: Diagnosis not present

## 2024-08-19 DIAGNOSIS — N1831 Chronic kidney disease, stage 3a: Secondary | ICD-10-CM | POA: Diagnosis not present

## 2024-08-21 DIAGNOSIS — E1165 Type 2 diabetes mellitus with hyperglycemia: Secondary | ICD-10-CM | POA: Diagnosis not present

## 2024-08-21 DIAGNOSIS — I10 Essential (primary) hypertension: Secondary | ICD-10-CM | POA: Diagnosis not present

## 2024-08-21 DIAGNOSIS — N1831 Chronic kidney disease, stage 3a: Secondary | ICD-10-CM | POA: Diagnosis not present

## 2024-08-21 DIAGNOSIS — E1122 Type 2 diabetes mellitus with diabetic chronic kidney disease: Secondary | ICD-10-CM | POA: Diagnosis not present

## 2024-08-26 DIAGNOSIS — M50122 Cervical disc disorder at C5-C6 level with radiculopathy: Secondary | ICD-10-CM | POA: Diagnosis not present

## 2024-09-09 DIAGNOSIS — M50122 Cervical disc disorder at C5-C6 level with radiculopathy: Secondary | ICD-10-CM | POA: Diagnosis not present

## 2024-09-15 DIAGNOSIS — M5412 Radiculopathy, cervical region: Secondary | ICD-10-CM | POA: Diagnosis not present

## 2024-09-15 DIAGNOSIS — M65312 Trigger thumb, left thumb: Secondary | ICD-10-CM | POA: Diagnosis not present

## 2024-09-15 DIAGNOSIS — M50122 Cervical disc disorder at C5-C6 level with radiculopathy: Secondary | ICD-10-CM | POA: Diagnosis not present

## 2024-09-16 DIAGNOSIS — M50122 Cervical disc disorder at C5-C6 level with radiculopathy: Secondary | ICD-10-CM | POA: Diagnosis not present

## 2024-09-19 DIAGNOSIS — N1831 Chronic kidney disease, stage 3a: Secondary | ICD-10-CM | POA: Diagnosis not present

## 2024-09-19 DIAGNOSIS — E1165 Type 2 diabetes mellitus with hyperglycemia: Secondary | ICD-10-CM | POA: Diagnosis not present

## 2024-09-19 DIAGNOSIS — E1122 Type 2 diabetes mellitus with diabetic chronic kidney disease: Secondary | ICD-10-CM | POA: Diagnosis not present

## 2024-09-19 DIAGNOSIS — I10 Essential (primary) hypertension: Secondary | ICD-10-CM | POA: Diagnosis not present

## 2024-09-20 DIAGNOSIS — I10 Essential (primary) hypertension: Secondary | ICD-10-CM | POA: Diagnosis not present

## 2024-09-20 DIAGNOSIS — E1165 Type 2 diabetes mellitus with hyperglycemia: Secondary | ICD-10-CM | POA: Diagnosis not present

## 2024-09-20 DIAGNOSIS — E1122 Type 2 diabetes mellitus with diabetic chronic kidney disease: Secondary | ICD-10-CM | POA: Diagnosis not present

## 2024-09-20 DIAGNOSIS — N1831 Chronic kidney disease, stage 3a: Secondary | ICD-10-CM | POA: Diagnosis not present

## 2024-09-23 DIAGNOSIS — M50122 Cervical disc disorder at C5-C6 level with radiculopathy: Secondary | ICD-10-CM | POA: Diagnosis not present

## 2024-09-30 ENCOUNTER — Other Ambulatory Visit: Payer: Self-pay | Admitting: Internal Medicine

## 2024-09-30 DIAGNOSIS — Z1231 Encounter for screening mammogram for malignant neoplasm of breast: Secondary | ICD-10-CM

## 2024-09-30 DIAGNOSIS — M50122 Cervical disc disorder at C5-C6 level with radiculopathy: Secondary | ICD-10-CM | POA: Diagnosis not present

## 2024-10-11 DIAGNOSIS — Z7985 Long-term (current) use of injectable non-insulin antidiabetic drugs: Secondary | ICD-10-CM | POA: Diagnosis not present

## 2024-10-11 DIAGNOSIS — Z6833 Body mass index (BMI) 33.0-33.9, adult: Secondary | ICD-10-CM | POA: Diagnosis not present

## 2024-10-11 DIAGNOSIS — Z818 Family history of other mental and behavioral disorders: Secondary | ICD-10-CM | POA: Diagnosis not present

## 2024-10-11 DIAGNOSIS — R32 Unspecified urinary incontinence: Secondary | ICD-10-CM | POA: Diagnosis not present

## 2024-10-11 DIAGNOSIS — Z833 Family history of diabetes mellitus: Secondary | ICD-10-CM | POA: Diagnosis not present

## 2024-10-11 DIAGNOSIS — N189 Chronic kidney disease, unspecified: Secondary | ICD-10-CM | POA: Diagnosis not present

## 2024-10-11 DIAGNOSIS — M1990 Unspecified osteoarthritis, unspecified site: Secondary | ICD-10-CM | POA: Diagnosis not present

## 2024-10-11 DIAGNOSIS — M069 Rheumatoid arthritis, unspecified: Secondary | ICD-10-CM | POA: Diagnosis not present

## 2024-10-11 DIAGNOSIS — M2040 Other hammer toe(s) (acquired), unspecified foot: Secondary | ICD-10-CM | POA: Diagnosis not present

## 2024-10-11 DIAGNOSIS — Z856 Personal history of leukemia: Secondary | ICD-10-CM | POA: Diagnosis not present

## 2024-10-11 DIAGNOSIS — M2010 Hallux valgus (acquired), unspecified foot: Secondary | ICD-10-CM | POA: Diagnosis not present

## 2024-10-11 DIAGNOSIS — E11319 Type 2 diabetes mellitus with unspecified diabetic retinopathy without macular edema: Secondary | ICD-10-CM | POA: Diagnosis not present

## 2024-10-11 DIAGNOSIS — E669 Obesity, unspecified: Secondary | ICD-10-CM | POA: Diagnosis not present

## 2024-10-11 DIAGNOSIS — E785 Hyperlipidemia, unspecified: Secondary | ICD-10-CM | POA: Diagnosis not present

## 2024-10-11 DIAGNOSIS — Z8249 Family history of ischemic heart disease and other diseases of the circulatory system: Secondary | ICD-10-CM | POA: Diagnosis not present

## 2024-10-11 DIAGNOSIS — I129 Hypertensive chronic kidney disease with stage 1 through stage 4 chronic kidney disease, or unspecified chronic kidney disease: Secondary | ICD-10-CM | POA: Diagnosis not present

## 2024-10-11 DIAGNOSIS — E1122 Type 2 diabetes mellitus with diabetic chronic kidney disease: Secondary | ICD-10-CM | POA: Diagnosis not present

## 2024-10-19 DIAGNOSIS — E1165 Type 2 diabetes mellitus with hyperglycemia: Secondary | ICD-10-CM | POA: Diagnosis not present

## 2024-10-19 DIAGNOSIS — I10 Essential (primary) hypertension: Secondary | ICD-10-CM | POA: Diagnosis not present

## 2024-10-19 DIAGNOSIS — N1831 Chronic kidney disease, stage 3a: Secondary | ICD-10-CM | POA: Diagnosis not present

## 2024-10-19 DIAGNOSIS — E1122 Type 2 diabetes mellitus with diabetic chronic kidney disease: Secondary | ICD-10-CM | POA: Diagnosis not present

## 2024-10-21 DIAGNOSIS — N1832 Chronic kidney disease, stage 3b: Secondary | ICD-10-CM | POA: Diagnosis not present

## 2024-10-21 DIAGNOSIS — I10 Essential (primary) hypertension: Secondary | ICD-10-CM | POA: Diagnosis not present

## 2024-10-21 DIAGNOSIS — C9110 Chronic lymphocytic leukemia of B-cell type not having achieved remission: Secondary | ICD-10-CM | POA: Diagnosis not present

## 2024-10-21 DIAGNOSIS — Z794 Long term (current) use of insulin: Secondary | ICD-10-CM | POA: Diagnosis not present

## 2024-10-21 DIAGNOSIS — M549 Dorsalgia, unspecified: Secondary | ICD-10-CM | POA: Diagnosis not present

## 2024-10-21 DIAGNOSIS — E782 Mixed hyperlipidemia: Secondary | ICD-10-CM | POA: Diagnosis not present

## 2024-10-21 DIAGNOSIS — E1122 Type 2 diabetes mellitus with diabetic chronic kidney disease: Secondary | ICD-10-CM | POA: Diagnosis not present

## 2024-10-21 DIAGNOSIS — H3411 Central retinal artery occlusion, right eye: Secondary | ICD-10-CM | POA: Diagnosis not present

## 2024-10-23 DIAGNOSIS — M50122 Cervical disc disorder at C5-C6 level with radiculopathy: Secondary | ICD-10-CM | POA: Diagnosis not present

## 2024-11-10 ENCOUNTER — Ambulatory Visit
Admission: RE | Admit: 2024-11-10 | Discharge: 2024-11-10 | Disposition: A | Discharge status: Home / Self Care | Source: Ambulatory Visit | Attending: Internal Medicine | Admitting: Internal Medicine

## 2024-11-10 DIAGNOSIS — Z1231 Encounter for screening mammogram for malignant neoplasm of breast: Secondary | ICD-10-CM | POA: Diagnosis present

## 2025-01-29 ENCOUNTER — Other Ambulatory Visit

## 2025-08-07 ENCOUNTER — Ambulatory Visit: Admitting: Oncology

## 2025-08-07 ENCOUNTER — Other Ambulatory Visit
